# Patient Record
Sex: Female | Born: 1940 | Race: Black or African American | Hispanic: No | Marital: Married | Smoking: Never smoker
Health system: Southern US, Community
[De-identification: ages and names within clinical notes are randomized; demographics above are authoritative.]

## PROBLEM LIST (undated history)

## (undated) DIAGNOSIS — I10 Essential (primary) hypertension: Secondary | ICD-10-CM

## (undated) DIAGNOSIS — IMO0002 Reserved for concepts with insufficient information to code with codable children: Secondary | ICD-10-CM

## (undated) DIAGNOSIS — J45909 Unspecified asthma, uncomplicated: Secondary | ICD-10-CM

## (undated) DIAGNOSIS — M329 Systemic lupus erythematosus, unspecified: Secondary | ICD-10-CM

## (undated) HISTORY — PX: SHOULDER ARTHROSCOPY W/ ROTATOR CUFF REPAIR: SHX2400

## (undated) HISTORY — PX: ABDOMINAL HYSTERECTOMY: SHX81

## (undated) HISTORY — DX: Unspecified asthma, uncomplicated: J45.909

## (undated) HISTORY — PX: CHOLECYSTECTOMY: SHX55

## (undated) HISTORY — PX: TONSILLECTOMY: SUR1361

---

## 1998-10-08 ENCOUNTER — Encounter: Payer: Self-pay | Admitting: Emergency Medicine

## 1998-10-08 ENCOUNTER — Ambulatory Visit (HOSPITAL_COMMUNITY): Admission: RE | Admit: 1998-10-08 | Discharge: 1998-10-08 | Payer: Self-pay | Admitting: Emergency Medicine

## 1999-10-21 ENCOUNTER — Encounter: Payer: Self-pay | Admitting: Emergency Medicine

## 1999-10-21 ENCOUNTER — Ambulatory Visit (HOSPITAL_COMMUNITY): Admission: RE | Admit: 1999-10-21 | Discharge: 1999-10-21 | Payer: Self-pay | Admitting: Emergency Medicine

## 1999-12-15 ENCOUNTER — Encounter: Admission: RE | Admit: 1999-12-15 | Discharge: 1999-12-15 | Payer: Self-pay | Admitting: Emergency Medicine

## 1999-12-15 ENCOUNTER — Encounter: Payer: Self-pay | Admitting: Emergency Medicine

## 2000-09-23 ENCOUNTER — Ambulatory Visit (HOSPITAL_COMMUNITY): Admission: RE | Admit: 2000-09-23 | Discharge: 2000-09-23 | Payer: Self-pay | Admitting: Emergency Medicine

## 2000-09-23 ENCOUNTER — Encounter: Payer: Self-pay | Admitting: Emergency Medicine

## 2001-09-25 ENCOUNTER — Encounter: Payer: Self-pay | Admitting: Emergency Medicine

## 2001-09-25 ENCOUNTER — Ambulatory Visit (HOSPITAL_COMMUNITY): Admission: RE | Admit: 2001-09-25 | Discharge: 2001-09-25 | Payer: Self-pay | Admitting: Emergency Medicine

## 2002-01-16 ENCOUNTER — Other Ambulatory Visit: Admission: RE | Admit: 2002-01-16 | Discharge: 2002-01-16 | Payer: Self-pay | Admitting: Internal Medicine

## 2002-03-12 ENCOUNTER — Ambulatory Visit (HOSPITAL_COMMUNITY): Admission: RE | Admit: 2002-03-12 | Discharge: 2002-03-12 | Payer: Self-pay | Admitting: Gastroenterology

## 2002-09-26 ENCOUNTER — Encounter: Payer: Self-pay | Admitting: Internal Medicine

## 2002-09-26 ENCOUNTER — Ambulatory Visit (HOSPITAL_COMMUNITY): Admission: RE | Admit: 2002-09-26 | Discharge: 2002-09-26 | Payer: Self-pay | Admitting: Internal Medicine

## 2003-09-10 ENCOUNTER — Ambulatory Visit (HOSPITAL_BASED_OUTPATIENT_CLINIC_OR_DEPARTMENT_OTHER): Admission: RE | Admit: 2003-09-10 | Discharge: 2003-09-10 | Payer: Self-pay | Admitting: Orthopedic Surgery

## 2003-09-10 ENCOUNTER — Ambulatory Visit (HOSPITAL_COMMUNITY): Admission: RE | Admit: 2003-09-10 | Discharge: 2003-09-10 | Payer: Self-pay | Admitting: Orthopedic Surgery

## 2003-09-10 ENCOUNTER — Encounter (INDEPENDENT_AMBULATORY_CARE_PROVIDER_SITE_OTHER): Payer: Self-pay | Admitting: Orthopedic Surgery

## 2003-09-30 ENCOUNTER — Ambulatory Visit (HOSPITAL_COMMUNITY): Admission: RE | Admit: 2003-09-30 | Discharge: 2003-09-30 | Payer: Self-pay | Admitting: Internal Medicine

## 2004-04-05 ENCOUNTER — Encounter: Admission: RE | Admit: 2004-04-05 | Discharge: 2004-04-05 | Payer: Self-pay | Admitting: Rheumatology

## 2004-09-30 ENCOUNTER — Ambulatory Visit (HOSPITAL_COMMUNITY): Admission: RE | Admit: 2004-09-30 | Discharge: 2004-09-30 | Payer: Self-pay | Admitting: Internal Medicine

## 2005-10-07 ENCOUNTER — Ambulatory Visit (HOSPITAL_COMMUNITY): Admission: RE | Admit: 2005-10-07 | Discharge: 2005-10-07 | Payer: Self-pay | Admitting: Internal Medicine

## 2006-10-11 ENCOUNTER — Ambulatory Visit (HOSPITAL_COMMUNITY): Admission: RE | Admit: 2006-10-11 | Discharge: 2006-10-11 | Payer: Self-pay | Admitting: Internal Medicine

## 2007-10-17 ENCOUNTER — Ambulatory Visit (HOSPITAL_COMMUNITY): Admission: RE | Admit: 2007-10-17 | Discharge: 2007-10-17 | Payer: Self-pay | Admitting: Internal Medicine

## 2008-11-12 ENCOUNTER — Ambulatory Visit (HOSPITAL_COMMUNITY): Admission: RE | Admit: 2008-11-12 | Discharge: 2008-11-12 | Payer: Self-pay | Admitting: Internal Medicine

## 2009-11-13 ENCOUNTER — Ambulatory Visit (HOSPITAL_COMMUNITY): Admission: RE | Admit: 2009-11-13 | Discharge: 2009-11-13 | Payer: Self-pay | Admitting: Internal Medicine

## 2010-07-04 ENCOUNTER — Emergency Department (HOSPITAL_COMMUNITY): Admission: EM | Admit: 2010-07-04 | Discharge: 2010-07-04 | Payer: Self-pay | Admitting: Family Medicine

## 2010-09-20 ENCOUNTER — Encounter: Payer: Self-pay | Admitting: Internal Medicine

## 2010-10-16 ENCOUNTER — Other Ambulatory Visit (HOSPITAL_COMMUNITY): Payer: Self-pay | Admitting: Internal Medicine

## 2010-10-16 DIAGNOSIS — Z1231 Encounter for screening mammogram for malignant neoplasm of breast: Secondary | ICD-10-CM

## 2010-11-16 ENCOUNTER — Ambulatory Visit (HOSPITAL_COMMUNITY)
Admission: RE | Admit: 2010-11-16 | Discharge: 2010-11-16 | Disposition: A | Discharge status: Home / Self Care | Payer: Medicare Other | Source: Ambulatory Visit | Attending: Internal Medicine | Admitting: Internal Medicine

## 2010-11-16 DIAGNOSIS — Z1231 Encounter for screening mammogram for malignant neoplasm of breast: Secondary | ICD-10-CM

## 2011-01-15 NOTE — Procedures (Signed)
. [facility]  Patient:    Jessica Booker, Jessica Booker Visit Number: [id number] MRN: [id number]          Service Type: END Location: ENDO Attending Physician:  Charna Elizabeth Dictated by:   Anselmo Rod, M.D. Proc. Date: 03/12/02 Admit Date:  03/12/2002 Discharge Date: 03/12/2002   CC:         Velna Hatchet, M.D.   Procedure Report  DATE OF BIRTH:  November 08, 1940.  PROCEDURE:  Esophagogastroduodenoscopy.  ENDOSCOPIST:  Anselmo Rod, M.D.  INSTRUMENT USED:  Olympus video panendoscope.  INDICATION FOR PROCEDURE:  A 70 year old African-American female with a history of guaiac-positive stool and anemia.  Rule out peptic ulcer disease, esophagitis, gastritis, etc.  PREPROCEDURE PREPARATION:  Informed consent was procured from the patient. The patient was fasted for eight hours prior to the procedure.  PREPROCEDURE PHYSICAL:  VITAL SIGNS:  The patient had stable vital signs.  NECK:  Supple.  CHEST:  Clear to auscultation.  S1, S2 regular.  ABDOMEN:  Soft with normal bowel sounds.  DESCRIPTION OF PROCEDURE:  The patient was placed in the left lateral decubitus position and sedated with 60 mg of Demerol and 6 mg of Versed intravenously.  Once the patient was adequately sedate and maintained on low-flow oxygen and continuous cardiac monitoring, the Olympus video panendoscope was advanced through the mouthpiece, over the tongue, into the esophagus under direct vision.  The entire esophagus, including the stomach and the proximal small bowel, appeared normal.  No erosions, ulcerations, masses, or polyps were present.  IMPRESSION:  Normal EGD.  RECOMMENDATIONS:  Proceed with a colonoscopy at this time. Dictated by:   Anselmo Rod, M.D. Attending Physician:  Charna Elizabeth DD:  03/12/02 TD:  03/15/02 Job: [id number]

## 2011-01-15 NOTE — Op Note (Signed)
NAME:  Jessica Booker, Jessica Booker                       ACCOUNT NO.:  [id number]   MEDICAL RECORD NO.:  [id number]                   PATIENT TYPE:  AMB   LOCATION:  DSC                                  FACILITY:  [facility]   PHYSICIAN:  Katy Fitch. Naaman Plummer., M.D.          DATE OF BIRTH:  [birth year]   DATE OF PROCEDURE:  09/10/2003  DATE OF DISCHARGE:                                 OPERATIVE REPORT   PREOPERATIVE DIAGNOSIS:  1. 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  2. Skin lesion consistent with wart, left palm.  3. Carpal metacarpal degenerative arthritis left thumb.  4. Chronic stenosing tenosynovitis left long finger.   POSTOPERATIVE DIAGNOSIS:  1. 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  2. Skin lesion consistent with wart, left palm.  3. Carpal metacarpal degenerative arthritis left thumb.  4. Chronic stenosing tenosynovitis left long finger.   PROCEDURE:  1. Release of left thumb A1 pulley.  2. Excision of wart, left palm.  3. Injection of left thumb CMC joint with Depo-Medrol 40 mg/mL.  4. Injection of left long finger stenosing tenosynovitis at A1 pulley.   SURGEON:  Katy Fitch. Sypher, M.D.   ASSISTANT:  Jonni Sanger, P.A.   ANESTHESIA:  General by LMA, supervising anesthesiologist is Maren Beach, M.D.   INDICATIONS FOR PROCEDURE:  The patient is a well-known patient who has been  followed for many years for degenerative arthritis of her left thumb CMC  joint and episodes of stenosing tenosynovitis.  She has failed to respond to  injection, activity modification, and anti-inflammatory treatment of her  left thumb chronic stenosing tenosynovitis.  We recommended proceeding with  release of her left thumb A1 pulley.   She noted other incidental problems including early stenosing tenosynovitis  of her long finger and a wart-like lesion growing in the palm adjacent to  the thumb/index web space of the left hand.  She also had chronic  pain in  the base of her left thumb and requested injection while under anesthesia.   After informed consent, she is brought for this cluster of procedures.   DESCRIPTION OF PROCEDURE:  The patient was brought to the operating room and  placed in the supine position on the operating table.  Following induction  of general anesthesia, the right arm was prepped with Betadine soap and  solution and sterilely draped.  Following exsanguination of the limb in the  Esmarch bandage, the arterial tourniquet on the proximal brachium was  inflated to 220 mmHg.   The procedure commenced with a short transverse incision directly over the  palpably thickened A1 pulley of the thumb.  Subcutaneous tissues were  carefully divided from the palmar fascia.  This was split with scissors  revealing the A1 pulley of the flexor sheath.  The radial proper digital  nerve was gently retracted.  The A1 pulley was split with scalpel and  scissors and the tendon  delivered.  There was no sign of tendon damage.  Thereafter free range of motion of the IP joint was recovered.  The wound  was then repaired with mattress suture of 5-0 nylon.   Attention was then directed to the palm.  A 4 mm x 5 mm mass was excised  with an elliptical incision with a margin of normal tissue.  This was  consistent with a chronic verrucous wart-like lesion.  This was passed off  for pathologic evaluation.   The thumb CMC joint was then injected with approximately 0.75 mL of Depo-  Medrol 40 mg/mL and the long finger flexor sheath was injected with standard  technique utilizing a mixture of 1% plain lidocaine and Depo-Medrol 40 mg/mL  a total of about 1.2 mL distending the flexor sheath.   The patient had also asked if we could help remedy a wart-like lesion  adjacent to her index fingernail proximal to the ulnar nail fold.  I  explained to her that electrosurgery and incisional surgery was not a good  idea so close to her nail matrix,  however, we will attempt to curet this.   The wart-like lesion was curetted to a normal dermal base.  Hopefully, this  will correct this predicament as well for her.   All wounds were then dressed with Xeroform, sterile gauze, and Coban.  There  were no apparent complications.   The patient was awakened from anesthesia and transferred to the recovery  room with stable vital signs.  She will be discharged with prescription for  Darvocet-N 100 one to two tablets p.o. q.4-6h p.r.n. pain 20 tablets without  refill.                                               Katy Fitch Naaman Plummer., M.D.    RVS/MEDQ  D:  09/10/2003  T:  09/10/2003  Job:  [id number]

## 2011-01-15 NOTE — Procedures (Signed)
[place]  Patient:    Jessica Booker, Jessica Booker Visit Number: [id number] MRN: [id number]          Service Type: END Location: ENDO Attending Physician:  Charna Elizabeth Dictated by:   Anselmo Rod, M.D. Proc. Date: 03/12/02 Admit Date:  03/12/2002 Discharge Date: 03/12/2002   CC:         Velna Hatchet, M.D.   Procedure Report  DATE OF BIRTH:  1941-04-25.  PROCEDURE:  Screening colonoscopy.  ENDOSCOPIST:  Anselmo Rod, M.D.  INSTRUMENT USED:  Olympus video colonoscope.  INDICATION FOR PROCEDURE:  Guaiac-positive stools and a history of iron deficiency anemia in a 70 year old African-American female.  Rule out colonic polyps, masses, hemorrhoids, etc.  PREPROCEDURE PREPARATION:  Informed consent was procured from the patient. The patient was fasted for eight hours prior to the procedure and prepped with a bottle of magnesium citrate and a gallon of NuLytely the night prior to the procedure.  PREPROCEDURE PHYSICAL:  VITAL SIGNS:  The patient had stable vital signs.  NECK:  Supple.  CHEST:  Clear to auscultation.  S1, S2 regular.  ABDOMEN:  Soft with normal bowel sounds.  DESCRIPTION OF PROCEDURE:  The patient was placed in the left lateral decubitus position and sedated with additional 20 mg of Demerol and 2 mg of Versed intravenously.  Once the patient was adequately sedate and maintained on low-flow oxygen and continuous cardiac monitoring, the Olympus video colonoscope was advanced from the rectum to the cecum without difficulty.  No masses, polyps, erosions, ulcerations, diverticula, or hemorrhoids were seen. The procedure was complete up to the cecum.  The appendiceal orifice and the ileocecal valve were clearly visualized and photographed.  IMPRESSION:  Normal colonoscopy.  RECOMMENDATIONS: 1. A high-fiber diet has been recommended for the patient. 2. Repeat guaiac testing will be done on an outpatient basis and  further    recommendations made as needed. 3. Serial CBCs will be done to monitor her hemoglobin closely. Dictated by:   Anselmo Rod, M.D. Attending Physician:  Charna Elizabeth DD:  03/12/02 TD:  03/15/02 Job: [id number]

## 2011-01-31 ENCOUNTER — Emergency Department (HOSPITAL_COMMUNITY): Payer: Medicare Other

## 2011-01-31 ENCOUNTER — Emergency Department (HOSPITAL_COMMUNITY)
Admission: EM | Admit: 2011-01-31 | Discharge: 2011-01-31 | Disposition: A | Discharge status: Home / Self Care | Payer: Medicare Other | Attending: Emergency Medicine | Admitting: Emergency Medicine

## 2011-01-31 DIAGNOSIS — Z79899 Other long term (current) drug therapy: Secondary | ICD-10-CM | POA: Insufficient documentation

## 2011-01-31 DIAGNOSIS — I10 Essential (primary) hypertension: Secondary | ICD-10-CM | POA: Insufficient documentation

## 2011-01-31 DIAGNOSIS — IMO0002 Reserved for concepts with insufficient information to code with codable children: Secondary | ICD-10-CM | POA: Insufficient documentation

## 2011-01-31 DIAGNOSIS — M329 Systemic lupus erythematosus, unspecified: Secondary | ICD-10-CM | POA: Insufficient documentation

## 2011-01-31 DIAGNOSIS — R109 Unspecified abdominal pain: Secondary | ICD-10-CM | POA: Insufficient documentation

## 2011-01-31 LAB — CBC
MCH: 26.9 pg (ref 26.0–34.0)
MCHC: 33.5 g/dL (ref 30.0–36.0)
MCV: 80.1 fL (ref 78.0–100.0)
RBC: 4.28 MIL/uL (ref 3.87–5.11)
RDW: 14.1 % (ref 11.5–15.5)

## 2011-01-31 LAB — COMPREHENSIVE METABOLIC PANEL
Alkaline Phosphatase: 93 U/L (ref 39–117)
CO2: 31 mEq/L (ref 19–32)
Chloride: 101 mEq/L (ref 96–112)
GFR calc Af Amer: >60 mL/min (ref >60)
GFR calc non Af Amer: >60 mL/min (ref >60)
Glucose, Bld: 85 mg/dL (ref 70–99)
Sodium: 140 mEq/L (ref 135–145)
Total Bilirubin: 0.4 mg/dL (ref 0.3–1.2)
Total Protein: 7.9 g/dL (ref 6.0–8.3)

## 2011-01-31 LAB — URINALYSIS, ROUTINE W REFLEX MICROSCOPIC
Glucose, UA: NEGATIVE mg/dL
Hgb urine dipstick: NEGATIVE
Ketones, ur: NEGATIVE mg/dL
Nitrite: NEGATIVE

## 2011-01-31 LAB — DIFFERENTIAL
Eosinophils Absolute: 0.1 10*3/uL (ref 0.0–0.7)
Eosinophils Relative: 1 % (ref 0–5)
Lymphocytes Relative: 27 % (ref 12–46)
Lymphs Abs: 2.6 10*3/uL (ref 0.7–4.0)
Monocytes Relative: 7 % (ref 3–12)
Neutro Abs: 6.2 10*3/uL (ref 1.7–7.7)

## 2011-11-04 ENCOUNTER — Other Ambulatory Visit (HOSPITAL_COMMUNITY): Payer: Self-pay | Admitting: Internal Medicine

## 2011-11-04 DIAGNOSIS — Z1231 Encounter for screening mammogram for malignant neoplasm of breast: Secondary | ICD-10-CM

## 2011-11-15 ENCOUNTER — Other Ambulatory Visit: Payer: Self-pay | Admitting: Cardiology

## 2011-11-15 DIAGNOSIS — I739 Peripheral vascular disease, unspecified: Secondary | ICD-10-CM

## 2011-11-16 ENCOUNTER — Encounter (INDEPENDENT_AMBULATORY_CARE_PROVIDER_SITE_OTHER): Payer: Medicare Other

## 2011-11-16 DIAGNOSIS — I739 Peripheral vascular disease, unspecified: Secondary | ICD-10-CM

## 2011-11-30 ENCOUNTER — Ambulatory Visit (HOSPITAL_COMMUNITY)
Admission: RE | Admit: 2011-11-30 | Discharge: 2011-11-30 | Disposition: A | Discharge status: Home / Self Care | Payer: Medicare Other | Source: Ambulatory Visit | Attending: Internal Medicine | Admitting: Internal Medicine

## 2011-11-30 DIAGNOSIS — Z1231 Encounter for screening mammogram for malignant neoplasm of breast: Secondary | ICD-10-CM

## 2012-08-14 ENCOUNTER — Encounter (HOSPITAL_COMMUNITY): Payer: Self-pay | Admitting: *Deleted

## 2012-08-14 ENCOUNTER — Emergency Department (INDEPENDENT_AMBULATORY_CARE_PROVIDER_SITE_OTHER)
Admission: EM | Admit: 2012-08-14 | Discharge: 2012-08-14 | Disposition: A | Discharge status: Home / Self Care | Payer: Medicare Other | Source: Home / Self Care | Attending: Emergency Medicine | Admitting: Emergency Medicine

## 2012-08-14 ENCOUNTER — Emergency Department (INDEPENDENT_AMBULATORY_CARE_PROVIDER_SITE_OTHER): Payer: Medicare Other

## 2012-08-14 DIAGNOSIS — J45909 Unspecified asthma, uncomplicated: Secondary | ICD-10-CM

## 2012-08-14 HISTORY — DX: Essential (primary) hypertension: I10

## 2012-08-14 HISTORY — DX: Systemic lupus erythematosus, unspecified: M32.9

## 2012-08-14 HISTORY — DX: Reserved for concepts with insufficient information to code with codable children: IMO0002

## 2012-08-14 MED ORDER — AZITHROMYCIN 250 MG PO TABS: ORAL_TABLET | ORAL | Status: DC

## 2012-08-14 MED ORDER — ALBUTEROL SULFATE (5 MG/ML) 0.5% IN NEBU
5.0000 mg | INHALATION_SOLUTION | Freq: Once | RESPIRATORY_TRACT | Status: AC
  Administered 2012-08-14: 5 mg via RESPIRATORY_TRACT

## 2012-08-14 MED ORDER — IPRATROPIUM BROMIDE 0.02 % IN SOLN
0.5000 mg | Freq: Once | RESPIRATORY_TRACT | Status: AC
  Administered 2012-08-14: 0.5 mg via RESPIRATORY_TRACT

## 2012-08-14 MED ORDER — ALBUTEROL SULFATE (5 MG/ML) 0.5% IN NEBU
INHALATION_SOLUTION | RESPIRATORY_TRACT | Status: AC
  Filled 2012-08-14: qty 1

## 2012-08-14 MED ORDER — HYDROCOD POLST-CHLORPHEN POLST 10-8 MG/5ML PO LQCR: 5.0000 mL | Freq: Two times a day (BID) | ORAL | Status: DC | PRN

## 2012-08-14 MED ORDER — ALBUTEROL SULFATE HFA 108 (90 BASE) MCG/ACT IN AERS: 1.0000 | INHALATION_SPRAY | Freq: Four times a day (QID) | RESPIRATORY_TRACT | Status: DC | PRN

## 2012-08-14 MED ORDER — PREDNISONE 5 MG PO KIT: 1.0000 | PACK | Freq: Every day | ORAL | Status: DC

## 2012-08-14 NOTE — ED Provider Notes (Signed)
Chief Complaint  Patient presents with  . Cough    History of Present Illness:   Jessica Booker is a 71 year old female who has had a six-day history of cough productive of white to yellow sputum, chest tightness, wheezing, chest pain, ear congestion, nasal congestion with yellow drainage, headache, scratchy throat, chills, and diarrhea. She has hypertension and lupus and is on a number of medications for that. She denies any fever, nausea, or vomiting. There is no history of asthma.  Review of Systems:  Other than noted above, the patient denies any of the following symptoms. Systemic:  No fever, chills, sweats, fatigue, myalgias, headache, or anorexia. Eye:  No redness, pain or drainage. ENT:  No earache, ear congestion, nasal congestion, sneezing, rhinorrhea, sinus pressure, sinus pain, post nasal drip, or sore throat. Lungs:  No cough, sputum production, wheezing, shortness of breath, or chest pain. GI:  No abdominal pain, nausea, vomiting, or diarrhea.  PMFSH:  Past medical history, family history, social history, meds, and allergies were reviewed.  Physical Exam:   Vital signs:  BP 158/78  Pulse 83  Temp 98.1 F (36.7 C) (Oral)  Resp 20  SpO2 98% General:  Alert, in no distress. Eye:  No conjunctival injection or drainage. Lids were normal. ENT:  TMs and canals were normal, without erythema or inflammation.  Nasal mucosa was clear and uncongested, without drainage.  Mucous membranes were moist.  Pharynx was clear, without exudate or drainage.  There were no oral ulcerations or lesions. Neck:  Supple, no adenopathy, tenderness or mass. Lungs:  No respiratory distress.  She has loud inspiratory and expiratory wheezes bilaterally.  Heart:  Regular rhythm, without gallops, murmers or rubs. Skin:  Clear, warm, and dry, without rash or lesions.  Radiology:  Dg Chest 2 View  08/14/2012  *RADIOLOGY REPORT*  Clinical Data: Cough, congestion and low grade fever.  History of bronchitis.  CHEST -  2 VIEW  Comparison: None.  Findings: The heart size and mediastinal contours are normal. There is aortic atherosclerosis.  The lungs are clear and do not appear significantly hyperinflated.  There is no pleural effusion. Cholecystectomy clips are noted.  IMPRESSION: No active cardiopulmonary process.   Original Report Authenticated By: Carey Bullocks, M.D.    I reviewed the images independently and personally and concur with the radiologist's findings.  Course in Urgent Care Center:   She was given a DuoNeb breathing treatment but this made her cough worse. We therefore stopped it and switched to just a plain albuterol breathing treatment and this did seem to help with her cough and wheezing. After the breathing treatment she stated she felt better although her lungs still showed bilateral expiratory or wheezes. I suggested going to the hospital but she preferred to go home.  Assessment:  The encounter diagnosis was Asthmatic bronchitis.  Plan:   1.  The following meds were prescribed:   New Prescriptions   ALBUTEROL (PROVENTIL HFA;VENTOLIN HFA) 108 (90 BASE) MCG/ACT INHALER    Inhale 1-2 puffs into the lungs every 6 (six) hours as needed for wheezing.   AZITHROMYCIN (ZITHROMAX Z-PAK) 250 MG TABLET    Take as directed.   CHLORPHENIRAMINE-HYDROCODONE (TUSSIONEX) 10-8 MG/5ML LQCR    Take 5 mLs by mouth every 12 (twelve) hours as needed.   PREDNISONE 5 MG KIT    Take 1 kit (5 mg total) by mouth daily after breakfast. Prednisone 5 mg 6 day dosepack.  Take as directed.   2.  The patient was instructed in  symptomatic care and handouts were given. 3.  The patient was told to return if becoming worse in any way, if no better in 3 or 4 days, and given some red flag symptoms that would indicate earlier return.   Reuben Likes, MD 08/14/12 [phone]

## 2012-08-14 NOTE — ED Notes (Signed)
Dr. Lorenz Coaster notified.  He said to change tx. to just Albuteral.  He checked pt. and she had stopped coughing and was talking on the phone.  Treatment restarted with just Albuteral and pt. tolerating better.

## 2012-08-14 NOTE — ED Notes (Signed)
C/o cough onset last Wednesday with congestion in her throat.  Sharp pain in her chest when she coughs like a knife.  Also has sneezing, both ears itching, and warm at night and sweat on her pillow but did not check her temperature.

## 2012-10-24 ENCOUNTER — Other Ambulatory Visit (HOSPITAL_COMMUNITY): Payer: Self-pay | Admitting: Internal Medicine

## 2012-10-24 DIAGNOSIS — Z1231 Encounter for screening mammogram for malignant neoplasm of breast: Secondary | ICD-10-CM

## 2012-12-01 ENCOUNTER — Ambulatory Visit (HOSPITAL_COMMUNITY)
Admission: RE | Admit: 2012-12-01 | Discharge: 2012-12-01 | Disposition: A | Discharge status: Home / Self Care | Payer: Medicare Other | Source: Ambulatory Visit | Attending: Internal Medicine | Admitting: Internal Medicine

## 2012-12-01 DIAGNOSIS — Z1231 Encounter for screening mammogram for malignant neoplasm of breast: Secondary | ICD-10-CM | POA: Insufficient documentation

## 2013-10-24 ENCOUNTER — Other Ambulatory Visit (HOSPITAL_COMMUNITY): Payer: Self-pay | Admitting: Internal Medicine

## 2013-10-24 DIAGNOSIS — Z1231 Encounter for screening mammogram for malignant neoplasm of breast: Secondary | ICD-10-CM

## 2013-12-04 ENCOUNTER — Ambulatory Visit (HOSPITAL_COMMUNITY)
Admission: RE | Admit: 2013-12-04 | Discharge: 2013-12-04 | Disposition: A | Discharge status: Home / Self Care | Payer: Medicare Other | Source: Ambulatory Visit | Attending: Internal Medicine | Admitting: Internal Medicine

## 2013-12-04 DIAGNOSIS — Z1231 Encounter for screening mammogram for malignant neoplasm of breast: Secondary | ICD-10-CM

## 2014-12-03 ENCOUNTER — Other Ambulatory Visit (HOSPITAL_COMMUNITY): Payer: Self-pay | Admitting: Internal Medicine

## 2014-12-03 DIAGNOSIS — Z1231 Encounter for screening mammogram for malignant neoplasm of breast: Secondary | ICD-10-CM

## 2014-12-13 ENCOUNTER — Ambulatory Visit (HOSPITAL_COMMUNITY)
Admission: RE | Admit: 2014-12-13 | Discharge: 2014-12-13 | Disposition: A | Discharge status: Home / Self Care | Payer: Medicare Other | Source: Ambulatory Visit | Attending: Internal Medicine | Admitting: Internal Medicine

## 2014-12-13 DIAGNOSIS — Z1231 Encounter for screening mammogram for malignant neoplasm of breast: Secondary | ICD-10-CM | POA: Diagnosis present

## 2015-05-30 ENCOUNTER — Other Ambulatory Visit: Payer: Self-pay | Admitting: *Deleted

## 2015-05-30 DIAGNOSIS — I83893 Varicose veins of bilateral lower extremities with other complications: Secondary | ICD-10-CM

## 2015-07-30 ENCOUNTER — Encounter: Payer: Self-pay | Admitting: Surgery

## 2015-08-04 ENCOUNTER — Ambulatory Visit (HOSPITAL_COMMUNITY)
Admission: RE | Admit: 2015-08-04 | Discharge: 2015-08-04 | Disposition: A | Discharge status: Home / Self Care | Payer: Medicare Other | Source: Ambulatory Visit | Attending: Surgery | Admitting: Surgery

## 2015-08-04 ENCOUNTER — Ambulatory Visit (INDEPENDENT_AMBULATORY_CARE_PROVIDER_SITE_OTHER): Payer: Medicare Other | Admitting: Surgery

## 2015-08-04 ENCOUNTER — Encounter: Payer: Self-pay | Admitting: Surgery

## 2015-08-04 VITALS — BP 136/60 | HR 56 | Temp 98.1°F | Resp 16 | Ht 64.0 in | Wt 170.0 lb

## 2015-08-04 DIAGNOSIS — I872 Venous insufficiency (chronic) (peripheral): Secondary | ICD-10-CM | POA: Diagnosis not present

## 2015-08-04 DIAGNOSIS — I10 Essential (primary) hypertension: Secondary | ICD-10-CM | POA: Diagnosis not present

## 2015-08-04 DIAGNOSIS — I83893 Varicose veins of bilateral lower extremities with other complications: Secondary | ICD-10-CM | POA: Insufficient documentation

## 2015-08-04 NOTE — Progress Notes (Signed)
Patient name: Jessica Booker MRN: [id number] DOB: 28-Mar-1941 Sex: female   Referred by: Minette Brine  Reason for referral:  Chief Complaint  Patient presents with  . Varicose Veins    BLE pain x 3-4- months, R>L  No injuries, procedures or surgeries on either leg.      HISTORY OF PRESENT ILLNESS:  this is a very pleasant 74 year old female who comes in today 4 bilateral leg pain, right greater than left.  This began approximately 2-3 months ago. It has been getting better.  It has been associated with swelling.  She also complains of some numbness in the tips of her toes.   The patient has a history of lupus , which she states has been under fairly good control. She is also medically treated for hypertension.  Past Medical History  Diagnosis Date  . Hypertension   . Lupus [facility])     Past Surgical History  Procedure Laterality Date  . Tonsillectomy    . Cholecystectomy    . Abdominal hysterectomy    . Shoulder arthroscopy w/ rotator cuff repair      R shoulder    Social History   Social History  . Marital Status: Married    Spouse Name: N/A  . Number of Children: N/A  . Years of Education: N/A   Occupational History  . Not on file.   Social History Main Topics  . Smoking status: Never Smoker   . Smokeless tobacco: Not on file  . Alcohol Use: Not on file  . Drug Use: Not on file  . Sexual Activity: Yes    Birth Control/ Protection: Surgical   Other Topics Concern  . Not on file   Social History Narrative    Family History  Problem Relation Age of Onset  . Heart attack Mother   . Stomach cancer Father   . Hypertension Sister   . Heart disease Sister     Allergies as of 08/04/2015  . (No Known Allergies)    Current Outpatient Prescriptions on File Prior to Visit  Medication Sig Dispense Refill  . aspirin 81 MG tablet Take 81 mg by mouth daily.    . hydroxychloroquine (PLAQUENIL) 200 MG tablet Take 200 mg by mouth at bedtime.    Marland Kitchen NIFEdipine  (PROCARDIA-XL/ADALAT CC) 30 MG 24 hr tablet Take 30 mg by mouth daily.    Marland Kitchen albuterol (PROVENTIL HFA;VENTOLIN HFA) 108 (90 BASE) MCG/ACT inhaler Inhale 1-2 puffs into the lungs every 6 (six) hours as needed for wheezing. (Patient not taking: Reported on 08/04/2015) 1 Inhaler 0  . fish oil-omega-3 fatty acids 1000 MG capsule Take 1 g by mouth 2 (two) times daily.    . PredniSONE 5 MG KIT Take 1 kit (5 mg total) by mouth daily after breakfast. Prednisone 5 mg 6 day dosepack.  Take as directed. (Patient not taking: Reported on 08/04/2015) 1 kit 0   No current facility-administered medications on file prior to visit.     REVIEW OF SYSTEMS: Cardiovascular: No chest pain, chest pressure, palpitations, orthopnea, or dyspnea on exertion. Positive for pain in legs on walking No history of DVT or phlebitis. Pulmonary: No productive cough, asthma or wheezing. Neurologic: No weakness, paresthesias, aphasia, or amaurosis. No dizziness. Hematologic: No bleeding problems or clotting disorders. Musculoskeletal: No joint pain or joint swelling. Gastrointestinal: No blood in stool or hematemesis Genitourinary: No dysuria or hematuria. Psychiatric:: No history of major depression. Integumentary: No rashes or ulcers. Constitutional: No fever or  chills.  PHYSICAL EXAMINATION:  Filed Vitals:   08/04/15 1317  BP: 136/60  Pulse: 56  Temp: 98.1 F (36.7 C)  TempSrc: Oral  Resp: 16  Height: '5\' 4"'  (1.626 m)  Weight: 170 lb (77.111 kg)  SpO2: 98%   Body mass index is 29.17 kg/(m^2). General: The patient appears their stated age.   HEENT:  No gross abnormalities Pulmonary: Respirations are non-labored Abdomen: Soft and non-tender  Musculoskeletal: There are no major deformities.   Neurologic: No focal weakness or paresthesias are detected, Skin: There are no ulcer or rashes noted. Psychiatric: The patient has normal affect. Cardiovascular: There is a regular rate and rhythm without significant murmur  appreciated. 1+ pitting edema bilaterally.  Palpable left dorsalis pedis pulse.  Nonpalpable right however she has a triphasic posterior tibial Doppler signal on the right  Diagnostic Studies:  venous reflux evaluation was performed today.  There is no evidence of deep vein reflux or thrombosis  In the right or left leg  No evidence of superficial venous reflux in the left leg.  Trace reflux within the proximal right great saphenous vein    Assessment:   bilateral leg pain, right greater than left Plan:  I feel the patient has excellent arterial blood flow to both legs and that this is not arterial in origin. In addition, reflux study today shows no significant venous pathology. Therefore I suspect that her leg discomfort is possibly related to her lupus or an underlying lymphedema.  I have recommended that she attempts to wear compression stockings to help with her edema to see if this alleviates any of her symptoms.  She will contact me if she has any further questions.     Eldridge Abrahams, M.D. [facility] Office: [phone] Pager:  [phone]

## 2015-08-05 ENCOUNTER — Encounter: Payer: Self-pay | Admitting: Internal Medicine

## 2015-11-27 ENCOUNTER — Other Ambulatory Visit: Payer: Self-pay

## 2015-11-27 DIAGNOSIS — Z1231 Encounter for screening mammogram for malignant neoplasm of breast: Secondary | ICD-10-CM

## 2015-12-19 ENCOUNTER — Ambulatory Visit
Admission: RE | Admit: 2015-12-19 | Discharge: 2015-12-19 | Disposition: A | Discharge status: Home / Self Care | Payer: Medicare Other | Source: Ambulatory Visit

## 2015-12-19 DIAGNOSIS — Z1231 Encounter for screening mammogram for malignant neoplasm of breast: Secondary | ICD-10-CM

## 2016-02-18 ENCOUNTER — Encounter (HOSPITAL_COMMUNITY): Payer: Self-pay | Admitting: *Deleted

## 2016-02-18 ENCOUNTER — Ambulatory Visit (HOSPITAL_COMMUNITY)
Admission: EM | Admit: 2016-02-18 | Discharge: 2016-02-18 | Disposition: A | Discharge status: Home / Self Care | Payer: Medicare Other | Attending: Emergency Medicine | Admitting: Emergency Medicine

## 2016-02-18 DIAGNOSIS — L03115 Cellulitis of right lower limb: Secondary | ICD-10-CM | POA: Diagnosis not present

## 2016-02-18 MED ORDER — CEPHALEXIN 500 MG PO CAPS: 500.0000 mg | ORAL_CAPSULE | Freq: Four times a day (QID) | ORAL | Status: DC

## 2016-02-18 NOTE — ED Notes (Signed)
Foot  Pain    X  4  Days   -  Pt  Reports a  History  Of hammer toe      And is  Scheduled    To  Have  Surgery  On the  aafected  Toe in      A  Few  Days     [facility]      A  Few  Days ago  And  Noticed  Redness  Swelling  And  Pain in the    r foot     She  denys  Any  specefic  Injury

## 2016-02-18 NOTE — Discharge Instructions (Signed)

## 2016-02-18 NOTE — ED Provider Notes (Signed)
CSN: [id number]     Arrival date & time 02/18/16  1301 History   First MD Initiated Contact with Patient 02/18/16 1346     Chief Complaint  Patient presents with  . Foot Pain   (Consider location/radiation/quality/duration/timing/severity/associated sxs/prior Treatment) HPI History obtained from patient: Location: Right foot  Context/Duration: Patient  states that she was a camp last week, awoke to find her right foot red and swollen. Denies itching or concerns of bites Severity: No pain  Quality: Ache Timing:           Constant Home Treatment: Rubbing alcohol Associated symptoms:  None Family History: Myocardial infarction-mother    Past Medical History  Diagnosis Date  . Hypertension   . Lupus [facility])    Past Surgical History  Procedure Laterality Date  . Tonsillectomy    . Cholecystectomy    . Abdominal hysterectomy    . Shoulder arthroscopy w/ rotator cuff repair      R shoulder   Family History  Problem Relation Age of Onset  . Heart attack Mother   . Stomach cancer Father   . Hypertension Sister   . Heart disease Sister    Social History  Substance Use Topics  . Smoking status: Never Smoker   . Smokeless tobacco: None  . Alcohol Use: None   OB History    No data available     Review of Systems  Denies: HEADACHE, NAUSEA, ABDOMINAL PAIN, CHEST PAIN, CONGESTION, DYSURIA, SHORTNESS OF BREATH  Allergies  Review of patient's allergies indicates no known allergies.  Home Medications   Prior to Admission medications   Medication Sig Start Date End Date Taking? Authorizing Provider  albuterol (PROVENTIL HFA;VENTOLIN HFA) 108 (90 BASE) MCG/ACT inhaler Inhale 1-2 puffs into the lungs every 6 (six) hours as needed for wheezing. Patient not taking: Reported on 08/04/2015 08/14/12   Harden Mo, MD  aspirin 81 MG tablet Take 81 mg by mouth daily.    Historical Provider, MD  cephALEXin (KEFLEX) 500 MG capsule Take 1 capsule (500 mg total) by mouth 4 (four) times  daily. 02/18/16   Konrad Felix, PA  fish oil-omega-3 fatty acids 1000 MG capsule Take 1 g by mouth 2 (two) times daily.    Historical Provider, MD  hydrochlorothiazide (HYDRODIURIL) 25 MG tablet Take 25 mg by mouth daily. 07/29/15   Historical Provider, MD  hydroxychloroquine (PLAQUENIL) 200 MG tablet Take 200 mg by mouth at bedtime.    Historical Provider, MD  NIFEdipine (PROCARDIA-XL/ADALAT CC) 30 MG 24 hr tablet Take 30 mg by mouth daily.    Historical Provider, MD  PredniSONE 5 MG KIT Take 1 kit (5 mg total) by mouth daily after breakfast. Prednisone 5 mg 6 day dosepack.  Take as directed. Patient not taking: Reported on 08/04/2015 08/14/12   Harden Mo, MD   Meds Ordered and Administered this Visit  Medications - No data to display  BP 158/88 mmHg  Pulse 78  Temp(Src) 99.4 F (37.4 C)  Resp 18  SpO2 100% No data found.   Physical Exam NURSES NOTES AND VITAL SIGNS REVIEWED. CONSTITUTIONAL: Well developed, well nourished, no acute distress HEENT: normocephalic, atraumatic EYES: Conjunctiva normal NECK:normal ROM, supple, no adenopathy PULMONARY:No respiratory distress, normal effort ABDOMINAL: Soft, ND, NT BS+, No CVAT MUSCULOSKELETAL: Normal ROM of all extremities, right foot anterior surface is red and warm to touch with pitting edema to the ankle. There are no signs of BITES. The fifth and fourth toes did not look  particularly infected. SKIN: warm and dry without rash PSYCHIATRIC: Mood and affect, behavior are normal  ED Course  Procedures (including critical care time)  Labs Review Labs Reviewed - No data to display  Imaging Review No results found.   Visual Acuity Review  Right Eye Distance:   Left Eye Distance:   Bilateral Distance:    Right Eye Near:   Left Eye Near:    Bilateral Near:      Patient has an appointment with a podiatrist for Monday advised that she should have this followed by a podiatrist. There is a chronic break in the skin between  the fourth and fifth toes, patient is not diabetic and does not have allergies.  Prescription for Keflex 500 mg #20 4 times a day. 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   MDM   1. Cellulitis of right lower extremity     Patient is reassured that there are no issues that require transfer to higher level of care at this time or additional tests. Patient is advised to continue home symptomatic treatment. Patient is advised that if there are new or worsening symptoms to attend the emergency department, contact primary care provider, or return to UC. Instructions of care provided discharged home in stable condition.    THIS NOTE WAS GENERATED USING A VOICE RECOGNITION SOFTWARE PROGRAM. ALL REASONABLE EFFORTS  WERE MADE TO PROOFREAD THIS DOCUMENT FOR ACCURACY.  I have verbally reviewed the discharge instructions with the patient. A printed AVS was given to the patient.  All questions were answered prior to discharge.      Konrad Felix, [place] 02/18/16 1407

## 2016-02-23 ENCOUNTER — Encounter (HOSPITAL_COMMUNITY): Payer: Self-pay | Admitting: Emergency Medicine

## 2016-02-23 ENCOUNTER — Emergency Department (HOSPITAL_COMMUNITY)
Admission: EM | Admit: 2016-02-23 | Discharge: 2016-02-23 | Disposition: A | Discharge status: Home / Self Care | Payer: Medicare Other | Attending: Emergency Medicine | Admitting: Emergency Medicine

## 2016-02-23 ENCOUNTER — Emergency Department (HOSPITAL_COMMUNITY): Payer: Medicare Other

## 2016-02-23 DIAGNOSIS — Z7982 Long term (current) use of aspirin: Secondary | ICD-10-CM | POA: Insufficient documentation

## 2016-02-23 DIAGNOSIS — X58XXXA Exposure to other specified factors, initial encounter: Secondary | ICD-10-CM | POA: Insufficient documentation

## 2016-02-23 DIAGNOSIS — Y999 Unspecified external cause status: Secondary | ICD-10-CM | POA: Diagnosis not present

## 2016-02-23 DIAGNOSIS — Y939 Activity, unspecified: Secondary | ICD-10-CM | POA: Diagnosis not present

## 2016-02-23 DIAGNOSIS — Z79899 Other long term (current) drug therapy: Secondary | ICD-10-CM | POA: Diagnosis not present

## 2016-02-23 DIAGNOSIS — I10 Essential (primary) hypertension: Secondary | ICD-10-CM | POA: Insufficient documentation

## 2016-02-23 DIAGNOSIS — Y929 Unspecified place or not applicable: Secondary | ICD-10-CM | POA: Diagnosis not present

## 2016-02-23 DIAGNOSIS — S91301A Unspecified open wound, right foot, initial encounter: Secondary | ICD-10-CM | POA: Diagnosis not present

## 2016-02-23 DIAGNOSIS — L03115 Cellulitis of right lower limb: Secondary | ICD-10-CM | POA: Diagnosis present

## 2016-02-23 LAB — CBC WITH DIFFERENTIAL/PLATELET
BASOS ABS: 0 10*3/uL (ref 0.0–0.1)
Basophils Relative: 0 %
Eosinophils Absolute: 0.1 10*3/uL (ref 0.0–0.7)
Eosinophils Relative: 1 %
HEMATOCRIT: 38.8 % (ref 36.0–46.0)
HEMOGLOBIN: 12.4 g/dL (ref 12.0–15.0)
LYMPHS ABS: 2.6 10*3/uL (ref 0.7–4.0)
LYMPHS PCT: 28 %
MCH: 25.5 pg — ABNORMAL LOW (ref 26.0–34.0)
MCHC: 32 g/dL (ref 30.0–36.0)
MCV: 79.8 fL (ref 78.0–100.0)
Monocytes Absolute: 0.5 10*3/uL (ref 0.1–1.0)
Monocytes Relative: 5 %
NEUTROS PCT: 66 %
Neutro Abs: 6.1 10*3/uL (ref 1.7–7.7)
Platelets: 331 10*3/uL (ref 150–400)
RBC: 4.86 MIL/uL (ref 3.87–5.11)
RDW: 13.4 % (ref 11.5–15.5)
WBC: 9.3 10*3/uL (ref 4.0–10.5)

## 2016-02-23 LAB — BASIC METABOLIC PANEL
ANION GAP: 7 (ref 5–15)
BUN: 12 mg/dL (ref 6–20)
CHLORIDE: 103 mmol/L (ref 101–111)
CO2: 29 mmol/L (ref 22–32)
Calcium: 9.9 mg/dL (ref 8.9–10.3)
Creatinine, Ser: 0.82 mg/dL (ref 0.44–1.00)
GFR calc non Af Amer: >60 mL/min (ref >60)
GLUCOSE: 93 mg/dL (ref 65–99)
POTASSIUM: 3.5 mmol/L (ref 3.5–5.1)
Sodium: 139 mmol/L (ref 135–145)

## 2016-02-23 MED ORDER — CEPHALEXIN 500 MG PO CAPS: 500.0000 mg | ORAL_CAPSULE | Freq: Four times a day (QID) | ORAL | Status: DC

## 2016-02-23 NOTE — ED Provider Notes (Signed)
Presents with drainage between fourth and fifth toes onset last week. Currently by redness and swelling of her right foot. She reports that she is much improved since treatment with Keflex prescribed by an urgent care doctor. On exam patient is alert and in no distress right lower extremity in a more yellowish drainage between fourth and fifth toes. There is no swelling. There is minimal redness immediately proximal to the web space of the fourth and fifth toes. DP pulse 2+ toes with good capillary refill. Suspect the patient may have had small abscess which is draining spontaneously   Doug SouSam Minahil Quinlivan, MD 02/23/16 1615

## 2016-02-23 NOTE — ED Notes (Signed)
Patient transported to X-ray 

## 2016-02-23 NOTE — ED Notes (Signed)
Pt. Stated, I have this foot infection and I was sent over here by my Podiaitrist

## 2016-02-23 NOTE — ED Provider Notes (Signed)
CSN: [id number]     Arrival date & time 02/23/16  1045 History   First MD Initiated Contact with Patient 02/23/16 1448     Chief Complaint  Patient presents with  . Foot Pain     (Consider location/radiation/quality/duration/timing/severity/associated sxs/prior Treatment) HPI Jessica Booker is a 75 y.o. female history of hypertension and lupus here for evaluation of possible foot infection. Patient reports on 6/21 she was seen at an urgent care facility and diagnosed with a cellulitis in her right foot, started on Keflex, took her last pill yesterday. States that since starting the Keflex, her symptoms have improved. She reports being seen by her podiatrist today for reevaluation and was told that she may have a bone infection and he needed to come to the ED for evaluation. Reports some drainage from the wound in between her fourth and fifth toe. Patient reports intermittent tenderness, warmth in her right foot near her pinky toe. She denies any fevers, chills, nausea or vomiting, abdominal pain, leg pain or cold extremities.  Past Medical History  Diagnosis Date  . Hypertension   . Lupus [facility])    Past Surgical History  Procedure Laterality Date  . Tonsillectomy    . Cholecystectomy    . Abdominal hysterectomy    . Shoulder arthroscopy w/ rotator cuff repair      R shoulder   Family History  Problem Relation Age of Onset  . Heart attack Mother   . Stomach cancer Father   . Hypertension Sister   . Heart disease Sister    Social History  Substance Use Topics  . Smoking status: Never Smoker   . Smokeless tobacco: None  . Alcohol Use: None   OB History    No data available     Review of Systems A 10 point review of systems was completed and was negative except for pertinent positives and negatives as mentioned in the history of present illness     Allergies  Review of patient's allergies indicates no known allergies.  Home Medications   Prior to Admission  medications   Medication Sig Start Date End Date Taking? Authorizing Provider  aspirin 81 MG tablet Take 81 mg by mouth daily.   Yes Historical Provider, MD  hydrochlorothiazide (HYDRODIURIL) 25 MG tablet Take 25 mg by mouth daily. 07/29/15  Yes Historical Provider, MD  hydroxychloroquine (PLAQUENIL) 200 MG tablet Take 200 mg by mouth at bedtime.   Yes Historical Provider, MD  NIFEdipine (PROCARDIA-XL/ADALAT-CC/NIFEDICAL-XL) 30 MG 24 hr tablet Take 30 mg by mouth daily. 12/03/15  Yes Historical Provider, MD  omega-3 acid ethyl esters (LOVAZA) 1 g capsule Take 1 g by mouth 2 (two) times daily.   Yes Historical Provider, MD  albuterol (PROVENTIL HFA;VENTOLIN HFA) 108 (90 BASE) MCG/ACT inhaler Inhale 1-2 puffs into the lungs every 6 (six) hours as needed for wheezing. Patient not taking: Reported on 08/04/2015 08/14/12   Harden Mo, MD  cephALEXin (KEFLEX) 500 MG capsule Take 1 capsule (500 mg total) by mouth 4 (four) times daily. 02/23/16   Comer Locket, PA-C  PredniSONE 5 MG KIT Take 1 kit (5 mg total) by mouth daily after breakfast. Prednisone 5 mg 6 Jessica dosepack.  Take as directed. Patient not taking: Reported on 08/04/2015 08/14/12   Harden Mo, MD   BP 141/77 mmHg  Pulse 53  Temp(Src) 99.4 F (37.4 C) (Oral)  Resp 16  Ht _0  (1.626 m)  Wt 81.846 kg  BMI 30.96 kg/m2  SpO2  98% Physical Exam  Constitutional: She is oriented to person, place, and time. She appears well-developed and well-nourished.  HENT:  Head: Normocephalic and atraumatic.  Mouth/Throat: Oropharynx is clear and moist.  Eyes: Conjunctivae are normal. Pupils are equal, round, and reactive to light. Right eye exhibits no discharge. Left eye exhibits no discharge. No scleral icterus.  Neck: Neck supple.  Cardiovascular: Normal rate, regular rhythm and normal heart sounds.   Pulmonary/Chest: Effort normal and breath sounds normal. No respiratory distress. She has no wheezes. She has no rales.  Abdominal: Soft.  There is no tenderness.  Musculoskeletal: She exhibits no tenderness.  Neurological: She is alert and oriented to person, place, and time.  Cranial Nerves II-XII grossly intact  Skin: Skin is warm and dry. No rash noted.  Area of skin breakdown in between the crease of fourth and fifth toes on right foot. Some purulent drainage expressed. There is mild warmth and erythema surrounding the distal foot. Distal pulses are intact with brisk cap refill.  Psychiatric: She has a normal mood and affect.  Nursing note and vitals reviewed.   ED Course  Procedures (including critical care time) Labs Review Labs Reviewed  CBC WITH DIFFERENTIAL/PLATELET - Abnormal; Notable for the following:    MCH 25.5 (*)    All other components within normal limits  BASIC METABOLIC PANEL    Imaging Review Dg Foot Complete Right  02/23/2016  CLINICAL DATA:  Wound at fifth toe for 3 months, history hypertension, lupus EXAM: RIGHT FOOT COMPLETE - 3+ VIEW COMPARISON:  None FINDINGS: Degenerative changes first MTP joint with overlying soft tissue swelling. Slight osseous demineralization. Remaining joint spaces preserved. No acute fracture, dislocation or bone destruction. Soft tissue swelling fifth toe at extending into RIGHT foot. IMPRESSION: No acute osseous abnormalities. Electronically Signed   By: Lavonia Dana M.D.   On: 02/23/2016 15:37   I have personally reviewed and evaluated these images and lab results as part of my medical decision-making.   EKG Interpretation None      MDM  Patient with wound to the area between right fourth and fifth toe, improving on Keflex therapy. Sent today for reevaluation by podiatry. She does have some minimal yellow drainage, mild warmth, but no overt evidence of cellulitis. X-rays are negative for subcutaneous gas or osteomyelitis. No leukocytosis. Afebrile and hemodynamically stable. Although, she will need blood pressure recheck in 1 week. Discussed with her PCP, will see for  wound recheck on Wednesday at 4 PM. We will represcribed Keflex therapy. Discussed with my attending, Dr. Winfred Leeds who also saw and evaluated patient and agrees with above plan. Final diagnoses:  Wound, open, foot, right, initial encounter        Comer Locket, PA-C 02/23/16 [place], MD 02/23/16 [phone]

## 2016-02-23 NOTE — Discharge Instructions (Signed)
There does not appear to be an emergent cause for your symptoms at this time. Your exam, lab work and x-rays were all reassuring. Continue taking your antibiotics as prescribed. Follow-up with your PCP on Wednesday at 4 PM for a wound recheck. Return to ED for new or worsening symptoms.

## 2016-11-09 ENCOUNTER — Other Ambulatory Visit: Payer: Self-pay | Admitting: Internal Medicine

## 2016-11-09 DIAGNOSIS — Z1231 Encounter for screening mammogram for malignant neoplasm of breast: Secondary | ICD-10-CM

## 2016-12-23 ENCOUNTER — Ambulatory Visit
Admission: RE | Admit: 2016-12-23 | Discharge: 2016-12-23 | Disposition: A | Discharge status: Home / Self Care | Payer: Medicare Other | Source: Ambulatory Visit | Attending: Internal Medicine | Admitting: Internal Medicine

## 2016-12-23 DIAGNOSIS — Z1231 Encounter for screening mammogram for malignant neoplasm of breast: Secondary | ICD-10-CM

## 2017-01-14 ENCOUNTER — Other Ambulatory Visit: Payer: Self-pay | Admitting: Internal Medicine

## 2017-01-14 DIAGNOSIS — E049 Nontoxic goiter, unspecified: Secondary | ICD-10-CM

## 2017-01-27 ENCOUNTER — Ambulatory Visit
Admission: RE | Admit: 2017-01-27 | Discharge: 2017-01-27 | Disposition: A | Discharge status: Home / Self Care | Payer: Medicare Other | Source: Ambulatory Visit | Attending: Internal Medicine | Admitting: Internal Medicine

## 2017-01-27 DIAGNOSIS — E049 Nontoxic goiter, unspecified: Secondary | ICD-10-CM

## 2017-02-18 DIAGNOSIS — M19042 Primary osteoarthritis, left hand: Secondary | ICD-10-CM

## 2017-02-18 DIAGNOSIS — I7300 Raynaud's syndrome without gangrene: Secondary | ICD-10-CM | POA: Insufficient documentation

## 2017-02-18 DIAGNOSIS — Z79899 Other long term (current) drug therapy: Secondary | ICD-10-CM | POA: Insufficient documentation

## 2017-02-18 DIAGNOSIS — M51369 Other intervertebral disc degeneration, lumbar region without mention of lumbar back pain or lower extremity pain: Secondary | ICD-10-CM | POA: Insufficient documentation

## 2017-02-18 DIAGNOSIS — M19071 Primary osteoarthritis, right ankle and foot: Secondary | ICD-10-CM | POA: Insufficient documentation

## 2017-02-18 DIAGNOSIS — M19041 Primary osteoarthritis, right hand: Secondary | ICD-10-CM | POA: Insufficient documentation

## 2017-02-18 DIAGNOSIS — M5136 Other intervertebral disc degeneration, lumbar region: Secondary | ICD-10-CM | POA: Insufficient documentation

## 2017-02-18 DIAGNOSIS — E559 Vitamin D deficiency, unspecified: Secondary | ICD-10-CM | POA: Insufficient documentation

## 2017-02-18 DIAGNOSIS — Z8669 Personal history of other diseases of the nervous system and sense organs: Secondary | ICD-10-CM | POA: Insufficient documentation

## 2017-02-18 DIAGNOSIS — Z8679 Personal history of other diseases of the circulatory system: Secondary | ICD-10-CM | POA: Insufficient documentation

## 2017-02-18 DIAGNOSIS — M19072 Primary osteoarthritis, left ankle and foot: Secondary | ICD-10-CM | POA: Insufficient documentation

## 2017-02-18 DIAGNOSIS — M329 Systemic lupus erythematosus, unspecified: Secondary | ICD-10-CM | POA: Insufficient documentation

## 2017-02-18 NOTE — Progress Notes (Signed)
Office Visit Note  Patient: Jessica Booker             Date of Birth: 1941/04/28           MRN: [id number]             PCP: Dorothyann Peng, MD Referring: Dorothyann Peng, MD Visit Date: 02/22/2017 Occupation: @GUAROCC @    Subjective:  No chief complaint on file.   History of Present Illness: Jessica Booker is a 76 y.o. female with history of systemic lupus erythematosus. Her last visit was in October 2017 at the time she was doing really well and the plan was to reduce her Plaquenil to once a day. She states that she decided to stop the medication and did not come for follow-up visit that she was feeling quite well. She states for the last 2 months she's not feeling as well. she's been experiencing increased fatigue and joint pain. She also experiencing some tingling sensation in her extremities. She's been experiencing Raynaud's phenomenon as well. She denies any joint swelling. She had some discomfort in her right knee joint which is improved.  Activities of Daily Living:  Patient reports morning stiffness for 1 minute.   Patient Denies nocturnal pain.  Difficulty dressing/grooming: Denies Difficulty climbing stairs: Denies Difficulty getting out of chair: Denies Difficulty using hands for taps, buttons, cutlery, and/or writing: Denies   Review of Systems  Constitutional: Positive for fatigue. Negative for night sweats, weight gain, weight loss and weakness.  HENT: Positive for mouth dryness. Negative for mouth sores, trouble swallowing, trouble swallowing and nose dryness.   Eyes: Negative for pain, redness, visual disturbance and dryness.  Respiratory: Negative for cough, shortness of breath and difficulty breathing.   Cardiovascular: Positive for hypertension. Negative for chest pain, palpitations, irregular heartbeat and swelling in legs/feet.  Gastrointestinal: Negative for blood in stool, constipation and diarrhea.  Endocrine: Negative for increased urination.    Genitourinary: Negative for vaginal dryness.  Musculoskeletal: Positive for arthralgias, joint pain and morning stiffness. Negative for joint swelling, myalgias, muscle weakness, muscle tenderness and myalgias.  Skin: Positive for color change. Negative for rash, hair loss, skin tightness, ulcers and sensitivity to sunlight.  Allergic/Immunologic: Negative for susceptible to infections.  Neurological: Negative for dizziness, memory loss and night sweats.  Hematological: Negative for swollen glands.  Psychiatric/Behavioral: Negative for depressed mood and sleep disturbance. The patient is not nervous/anxious.     PMFS History:  Patient Active Problem List   Diagnosis Date Noted  . Systemic lupus erythematosus (HCC) 02/18/2017  . High risk medication use 02/18/2017  . Raynaud's disease without gangrene 02/18/2017  . DDD (degenerative disc disease), lumbar 02/18/2017  . Primary osteoarthritis of both hands 02/18/2017  . Primary osteoarthritis of both feet 02/18/2017  . Vitamin D deficiency 02/18/2017  . History of hypertension 02/18/2017  . History of neuropathy 02/18/2017    Past Medical History:  Diagnosis Date  . Hypertension   . Lupus     Family History  Problem Relation Age of Onset  . Heart attack Mother   . Stomach cancer Father   . Hypertension Sister   . Heart disease Sister    Past Surgical History:  Procedure Laterality Date  . ABDOMINAL HYSTERECTOMY    . CHOLECYSTECTOMY    . SHOULDER ARTHROSCOPY W/ ROTATOR CUFF REPAIR     R shoulder  . TONSILLECTOMY     Social History   Social History Narrative  . No narrative on file  Objective: Vital Signs: BP (!) 142/54   Pulse (!) 57   Resp 12   Ht 5\' 4"  (1.626 m)   Wt 168 lb (76.2 kg)   BMI 28.84 kg/m    Physical Exam  Constitutional: She is oriented to person, place, and time. She appears well-developed and well-nourished.  HENT:  Head: Normocephalic and atraumatic.  Eyes: Conjunctivae and EOM are  normal.  Neck: Normal range of motion.  Cardiovascular: Normal rate, regular rhythm, normal heart sounds and intact distal pulses.   Pulmonary/Chest: Effort normal and breath sounds normal.  Abdominal: Soft. Bowel sounds are normal.  Lymphadenopathy:    She has no cervical adenopathy.  Neurological: She is alert and oriented to person, place, and time.  Skin: Skin is warm and dry. Capillary refill takes less than 2 seconds.  Psychiatric: She has a normal mood and affect. Her behavior is normal.  Nursing note and vitals reviewed.    Musculoskeletal Exam: C-spine and thoracic lumbar spine good range of motion. Shoulder joints although joints wrist joints are good range of motion. She has DIP PIP thickening in her bilateral hands and feet with hammertoes. Hip joints knee joints ankle joints are good range of motion with no synovitis.  CDAI Exam: CDAI Homunculus Exam:   Joint Counts:  CDAI Tender Joint count: 0 CDAI Swollen Joint count: 0  Global Assessments:  Patient Global Assessment: 4 Provider Global Assessment: 1  CDAI Calculated Score: 5    Investigation: Findings:   In May 2016, CBC showed a hemoglobin of 11.6.  Comprehensive metabolic panel was normal.  Sed rate was 24.  Urinalysis was negative.  ANA was positive with a negative titer.  She has a positive Ro antibody.  The rest of the ENA was negative.  Compliments were normal.  Hepatitis panel and TB-Gold were negative.  09/11/2014 normal PLQ eye exam     Imaging: US Thyroid  Result Date: 01/27/2017 CLINICAL DATA:  Goiter. EXAM: THYROID ULTRASOUND TECHNIQUE: Ultrasound examination of the thyroid gland and adjacent soft tissues was performed. COMPARISON:  None. FINDINGS: Parenchymal Echotexture: Mildly heterogenous Isthmus: 0.9 cm in the AP dimension. Right lobe: 5.2 x 1.7 x 2.3 cm Left lobe: 4.6 x 2.3 x 2.4 cm _________________________________________________________ Estimated total number of nodules >/= 1 cm: 3 Number of  spongiform nodules >/=  2 cm not described below (TR1): 0 Number of mixed cystic and solid nodules >/= 1.5 cm not described below (TR2): 0 _________________________________________________________ Nodule # 1: Location: Isthmus; Mid Maximum size: 1.6 cm; Other 2 dimensions: 0.8 x 1.2 cm Composition: solid/almost completely solid (2) Echogenicity: hypoechoic (2) Shape: not taller-than-wide (0) Margins: ill-defined (0) Echogenic foci: none (0) ACR TI-RADS total points: 4. ACR TI-RADS risk category: TR4 (4-6 points). ACR TI-RADS recommendations: **Given size (>/= 1.5 cm) and appearance, fine needle aspiration of this moderately suspicious nodule should be considered based on TI-RADS criteria. _________________________________________________________ Nodule # 2: Location: Right; Superior Maximum size: 1.5 cm; Other 2 dimensions: 1.2 x 1.2 cm Composition: cannot determine (2) Echogenicity: isoechoic (1) Shape: not taller-than-wide (0) Margins: ill-defined (0) Echogenic foci: none (0) ACR TI-RADS total points: 3. ACR TI-RADS risk category: TR3 (3 points). ACR TI-RADS recommendations: *Given size (>/= 1.5 - 2.4 cm) and appearance, a follow-up ultrasound in 1 year should be considered based on TI-RADS criteria. _________________________________________________________ Nodule # 3: Location: Left; Mid Maximum size: 1.8 cm; Other 2 dimensions: 1.7 x 1.5 cm Composition: solid/almost completely solid (2) Echogenicity: isoechoic (1) Shape: not taller-than-wide (0) Margins: ill-defined (  0) Echogenic foci: none (0) ACR TI-RADS total points: 3. ACR TI-RADS risk category: TR3 (3 points). ACR TI-RADS recommendations: *Given size (>/= 1.5 - 2.4 cm) and appearance, a follow-up ultrasound in 1 year should be considered based on TI-RADS criteria. _________________________________________________________ Nodule # 4: Location: Left; Inferior Maximum size: 1.2 cm; Other 2 dimensions: 0.9 x 0.9 cm Composition: mixed cystic and solid (1)  Echogenicity: isoechoic (1) Shape: not taller-than-wide (0) Margins: ill-defined (0) Echogenic foci: none (0) ACR TI-RADS total points: 2. ACR TI-RADS risk category: TR2 (2 points). ACR TI-RADS recommendations: This nodule does NOT meet TI-RADS criteria for biopsy or dedicated follow-up. _________________________________________________________ Left thyroid lobe is diffusely heterogeneous with additional small nodules scattered throughout the left thyroid lobe. IMPRESSION: Bilateral thyroid nodules. Mildly hypoechoic nodule in the isthmus meets criteria for biopsy. This nodule measures up to 1.6 cm and labeled as Nodule #1. Dominant right and dominant left thyroid nodules both meet criteria for 1 year follow-up. The above is in keeping with the ACR TI-RADS recommendations - J Am Coll Radiol 2017;14:587-595. Electronically Signed   By: Richarda OverlieAdam  Henn M.D.   On: 01/27/2017 17:33    Speciality Comments: No specialty comments available.    Procedures:  No procedures performed Allergies: Patient has no known allergies.   Assessment / Plan:     Visit Diagnoses: Systemic lupus erythematosus  ANA, positive Smith, and positive Ro and RNP  -patient has known history of systemic lupus. She was on Plaquenil and had been doing well. She decided to come off Plaquenil about 1-1/2 years ago. She believes she is having a flare with increased fatigue and discomfort. I do not see any synovitis on examination today. I will obtain following labs. I also plan to resume her Plaquenil after the lab work. She will need eye exam on regular basis as well. Plan: Urinalysis, Routine w reflex microscopic, Sedimentation rate, ANA, C3 and C4, [id number] ENA PANEL  High risk medication use - Plaquenil 200 mg po qd( stopped 1 year ago) - Plan: CBC with Differential/Platelet, COMPLETE METABOLIC PANEL WITH GFR  Raynaud's disease without gangrene: She has intermittent issues with Raynauds  when exposed to cold weather.  DDD (degenerative  disc disease), lumbar: She does have some chronic lower back pain  Primary osteoarthritis of both hands: Joint protection and muscle strengthening was discussed.  Primary osteoarthritis of both feet: Due to hammertoes she has chronic discomfort. I have advised to get some proper fitting shoes with arch support.  Vitamin D deficiency - Plan: VITAMIN D 25 Hydroxy today.  History of hypertension: Her systolic blood pressure is high although her diastolic is normal. I've advised her to follow her blood pressure closely she's been followed by PCP for hypertension.  History of neuropathy: She has history of neuropathy. She has paresthesias in her upper extremities and lower extremities.  Other fatigue - Plan: CK, TSH    Orders: Orders Placed This Encounter  Procedures  . CBC with Differential/Platelet  . COMPLETE METABOLIC PANEL WITH GFR  . Urinalysis, Routine w reflex microscopic  . Sedimentation rate  . CK  . TSH  . ANA  . C3 and C4  . CP5000020 ENA PANEL  . VITAMIN D 25 Hydroxy (Vit-D Deficiency, Fractures)   No orders of the defined types were placed in this encounter.   Face-to-face time spent with patient was 30 minutes. 50% of time was spent in counseling and coordination of care.  Follow-Up Instructions: Return in about 5 months (around 07/25/2017) for Systemic  lupus OA DDD.   Pollyann Savoy, MD  Note - This record has been created using Animal nutritionist.  Chart creation errors have been sought, but may not always  have been located. Such creation errors do not reflect on  the standard of medical care.

## 2017-02-21 ENCOUNTER — Ambulatory Visit (HOSPITAL_COMMUNITY)
Admission: RE | Admit: 2017-02-21 | Discharge: 2017-02-21 | Disposition: A | Discharge status: Home / Self Care | Payer: Medicare Other | Source: Ambulatory Visit | Attending: Surgery | Admitting: Surgery

## 2017-02-21 ENCOUNTER — Other Ambulatory Visit (HOSPITAL_COMMUNITY): Payer: Self-pay | Admitting: Internal Medicine

## 2017-02-21 DIAGNOSIS — R42 Dizziness and giddiness: Secondary | ICD-10-CM

## 2017-02-21 DIAGNOSIS — I6523 Occlusion and stenosis of bilateral carotid arteries: Secondary | ICD-10-CM | POA: Diagnosis not present

## 2017-02-21 LAB — VAS US CAROTID
LCCADDIAS: -14 cm/s
LCCAPDIAS: 13 cm/s
LEFT ECA DIAS: -7 cm/s
LEFT VERTEBRAL DIAS: -15 cm/s
LICADDIAS: -16 cm/s
LICAPDIAS: -11 cm/s
LICAPSYS: -57 cm/s
Left CCA dist sys: -77 cm/s
Left CCA prox sys: 104 cm/s
Left ICA dist sys: -93 cm/s
RIGHT CCA MID DIAS: -11 cm/s
RIGHT ECA DIAS: -12 cm/s
RIGHT VERTEBRAL DIAS: -2 cm/s
Right CCA prox dias: 11 cm/s
Right CCA prox sys: 89 cm/s
Right cca dist sys: -52 cm/s

## 2017-02-22 ENCOUNTER — Ambulatory Visit (INDEPENDENT_AMBULATORY_CARE_PROVIDER_SITE_OTHER): Payer: Medicare Other | Admitting: Rheumatology

## 2017-02-22 ENCOUNTER — Encounter: Payer: Self-pay | Admitting: Rheumatology

## 2017-02-22 VITALS — BP 142/54 | HR 57 | Resp 12 | Ht 64.0 in | Wt 168.0 lb

## 2017-02-22 DIAGNOSIS — M19041 Primary osteoarthritis, right hand: Secondary | ICD-10-CM

## 2017-02-22 DIAGNOSIS — M5136 Other intervertebral disc degeneration, lumbar region: Secondary | ICD-10-CM | POA: Diagnosis not present

## 2017-02-22 DIAGNOSIS — I7300 Raynaud's syndrome without gangrene: Secondary | ICD-10-CM

## 2017-02-22 DIAGNOSIS — Z79899 Other long term (current) drug therapy: Secondary | ICD-10-CM

## 2017-02-22 DIAGNOSIS — R5383 Other fatigue: Secondary | ICD-10-CM

## 2017-02-22 DIAGNOSIS — E559 Vitamin D deficiency, unspecified: Secondary | ICD-10-CM | POA: Diagnosis not present

## 2017-02-22 DIAGNOSIS — Z8679 Personal history of other diseases of the circulatory system: Secondary | ICD-10-CM

## 2017-02-22 DIAGNOSIS — M19042 Primary osteoarthritis, left hand: Secondary | ICD-10-CM

## 2017-02-22 DIAGNOSIS — M19072 Primary osteoarthritis, left ankle and foot: Secondary | ICD-10-CM

## 2017-02-22 DIAGNOSIS — M19071 Primary osteoarthritis, right ankle and foot: Secondary | ICD-10-CM

## 2017-02-22 DIAGNOSIS — M3219 Other organ or system involvement in systemic lupus erythematosus: Secondary | ICD-10-CM

## 2017-02-22 DIAGNOSIS — Z8669 Personal history of other diseases of the nervous system and sense organs: Secondary | ICD-10-CM

## 2017-02-22 LAB — CBC WITH DIFFERENTIAL/PLATELET
BASOS PCT: 0 %
Basophils Absolute: 0 cells/uL (ref 0–200)
Eosinophils Absolute: 168 cells/uL (ref 15–500)
Eosinophils Relative: 2 %
HEMATOCRIT: 35.6 % (ref 35.0–45.0)
Hemoglobin: 11.5 g/dL — ABNORMAL LOW (ref 11.7–15.5)
Lymphocytes Relative: 28 %
Lymphs Abs: 2352 cells/uL (ref 850–3900)
MCH: 26.6 pg — ABNORMAL LOW (ref 27.0–33.0)
MCHC: 32.3 g/dL (ref 32.0–36.0)
MCV: 82.4 fL (ref 80.0–100.0)
MONO ABS: 504 {cells}/uL (ref 200–950)
MPV: 9.7 fL (ref 7.5–12.5)
Monocytes Relative: 6 %
NEUTROS ABS: 5376 {cells}/uL (ref 1500–7800)
Neutrophils Relative %: 64 %
Platelets: 288 10*3/uL (ref 140–400)
RBC: 4.32 MIL/uL (ref 3.80–5.10)
RDW: 14.4 % (ref 11.0–15.0)
WBC: 8.4 10*3/uL (ref 3.8–10.8)

## 2017-02-22 LAB — TSH: TSH: 0.89 mIU/L

## 2017-02-22 NOTE — Patient Instructions (Addendum)
Standing Labs We placed an order today for your standing lab work.    Please come back and get your standing labs in 1 month, then every 3 months  We have open lab Monday through Friday from 8:30-11:30 AM and 1:30-4 PM at the office of Dr. Arbutus PedShaili Shanikia Kernodle/Naitik Panwala, PA.   The office is located at [address], [address], [place], [place] [address] No appointment is necessary.   Labs are drawn by [facility].  You may receive a bill from [facility] for your lab work. If you have any questions regarding directions or hours of operation,  please call [phone].    Hydroxychloroquine tablets What is this medicine? HYDROXYCHLOROQUINE (hye drox ee KLOR oh kwin) is used to treat rheumatoid arthritis and systemic lupus erythematosus. It is also used to treat malaria. This medicine may be used for other purposes; ask your health care provider or pharmacist if you have questions. COMMON BRAND NAME(S): Plaquenil, Quineprox What should I tell my health care provider before I take this medicine? They need to know if you have any of these conditions: -diabetes -eye disease, vision problems -G6PD deficiency -history of blood diseases -history of irregular heartbeat -if you often drink alcohol -kidney disease -liver disease -porphyria -psoriasis -seizures -an unusual or allergic reaction to chloroquine, hydroxychloroquine, other medicines, foods, dyes, or preservatives -pregnant or trying to get pregnant -breast-feeding How should I use this medicine? Take this medicine by mouth with a glass of water. Follow the directions on the prescription label. Avoid taking antacids within 4 hours of taking this medicine. It is best to separate these medicines by at least 4 hours. Do not cut, crush or chew this medicine. You can take it with or without food. If it upsets your stomach, take it with food. Take your medicine at regular intervals. Do not take your medicine more often than directed. Take all of  your medicine as directed even if you think you are better. Do not skip doses or stop your medicine early. Talk to your pediatrician regarding the use of this medicine in children. While this drug may be prescribed for selected conditions, precautions do apply. Overdosage: If you think you have taken too much of this medicine contact a poison control center or emergency room at once. NOTE: This medicine is only for you. Do not share this medicine with others. What if I miss a dose? If you miss a dose, take it as soon as you can. If it is almost time for your next dose, take only that dose. Do not take double or extra doses. What may interact with this medicine? Do not take this medicine with any of the following medications: -cisapride -dofetilide -dronedarone -live virus vaccines -penicillamine -pimozide -thioridazine -ziprasidone This medicine may also interact with the following medications: -ampicillin -antacids -cimetidine -cyclosporine -digoxin -medicines for diabetes, like insulin, glipizide, glyburide -medicines for seizures like carbamazepine, phenobarbital, phenytoin -mefloquine -methotrexate -other medicines that prolong the QT interval (cause an abnormal heart rhythm) -praziquantel This list may not describe all possible interactions. Give your health care provider a list of all the medicines, herbs, non-prescription drugs, or dietary supplements you use. Also tell them if you smoke, drink alcohol, or use illegal drugs. Some items may interact with your medicine. What should I watch for while using this medicine? Tell your doctor or healthcare professional if your symptoms do not start to get better or if they get worse. Avoid taking antacids within 4 hours of taking this medicine. It is best  to separate these medicines by at least 4 hours. Tell your doctor or health care professional right away if you have any change in your eyesight. Your vision and blood may be tested  before and during use of this medicine. This medicine can make you more sensitive to the sun. Keep out of the sun. If you cannot avoid being in the sun, wear protective clothing and use sunscreen. Do not use sun lamps or tanning beds/booths. What side effects may I notice from receiving this medicine? Side effects that you should report to your doctor or health care professional as soon as possible: -allergic reactions like skin rash, itching or hives, swelling of the face, lips, or tongue -changes in vision -decreased hearing or ringing of the ears -redness, blistering, peeling or loosening of the skin, including inside the mouth -seizures -sensitivity to light -signs and symptoms of a dangerous change in heartbeat or heart rhythm like chest pain; dizziness; fast or irregular heartbeat; palpitations; feeling faint or lightheaded, falls; breathing problems -signs and symptoms of liver injury like dark yellow or brown urine; general ill feeling or flu-like symptoms; light-colored stools; loss of appetite; nausea; right upper belly pain; unusually weak or tired; yellowing of the eyes or skin -signs and symptoms of low blood sugar such as feeling anxious; confusion; dizziness; increased hunger; unusually weak or tired; sweating; shakiness; cold; irritable; headache; blurred vision; fast heartbeat; loss of consciousness -uncontrollable head, mouth, neck, arm, or leg movements Side effects that usually do not require medical attention (report to your doctor or health care professional if they continue or are bothersome): -anxious -diarrhea -dizziness -hair loss -headache -irritable -loss of appetite -nausea, vomiting -stomach pain This list may not describe all possible side effects. Call your doctor for medical advice about side effects. You may report side effects to FDA at 1-800-FDA-1088. Where should I keep my medicine? Keep out of the reach of children. In children, this medicine can cause  overdose with small doses. Store at room temperature between 15 and 30 degrees C (59 and 86 degrees F). Protect from moisture and light. Throw away any unused medicine after the expiration date. NOTE: This sheet is a summary. It may not cover all possible information. If you have questions about this medicine, talk to your doctor, pharmacist, or health care provider.  2018 Elsevier/Gold Standard (2016-03-31 14:16:15)

## 2017-02-22 NOTE — Progress Notes (Signed)
Pharmacy Note  Subjective: Patient presents today to the [facility] to see Dr. Corliss Skainseveshwar.  Patient is being restarted on hydroxychloroquine.  Patient seen by the pharmacist for counseling on hydroxychloroquine.    Objective: CMP Latest Ref Rng & Units 02/23/2016 01/31/2011  Glucose 65 - 99 mg/dL 93 85  BUN 6 - 20 mg/dL 12 13  Creatinine 1.610.44 - 1.00 mg/dL 0.960.82 0.450.75  Sodium 409135 - 145 mmol/L 139 140  Potassium 3.5 - 5.1 mmol/L 3.5 3.6  Chloride 101 - 111 mmol/L 103 101  CO2 22 - 32 mmol/L 29 31  Calcium 8.9 - 10.3 mg/dL 9.9 9.3  Total Protein 6.0 - 8.3 g/dL - 7.9  Total Bilirubin 0.3 - 1.2 mg/dL - 0.4  Alkaline Phos 39 - 117 U/L - 93  AST 0 - 37 U/L - 18  ALT 0 - 35 U/L - 13   CBC    Component Value Date/Time   WBC 9.3 02/23/2016 1545   RBC 4.86 02/23/2016 1545   HGB 12.4 02/23/2016 1545   HCT 38.8 02/23/2016 1545   PLT 331 02/23/2016 1545   MCV 79.8 02/23/2016 1545   MCH 25.5 (L) 02/23/2016 1545   MCHC 32.0 02/23/2016 1545   RDW 13.4 02/23/2016 1545   LYMPHSABS 2.6 02/23/2016 1545   MONOABS 0.5 02/23/2016 1545   EOSABS 0.1 02/23/2016 1545   BASOSABS 0.0 02/23/2016 1545   Updated CBC, CMP ordered today  Assessment/Plan: Patient was counseled on the purpose, proper use, and adverse effects of hydroxychloroquine including nausea/diarrhea, skin rash, headaches, and sun sensitivity.  Discussed importance of annual eye exams while on hydroxychloroquine to monitor to ocular toxicity and discussed importance of frequent laboratory monitoring.  Provided patient with eye exam form for baseline ophthalmologic exam and standing lab instructions.  Provided patient with educational materials on hydroxychloroquine and answered all questions.  Patient consented to hydroxychloroquine.  Will upload consent in the media tab.  Will plan to start hydroxychloroquine once patient's lab results return.    Lilla Shookachel Dyland Panuco, Pharm.D., BCPS Clinical Pharmacist Pager: [phone] Phone:  [phone] 02/22/2017 2:32 PM

## 2017-02-23 ENCOUNTER — Other Ambulatory Visit: Payer: Self-pay | Admitting: Rheumatology

## 2017-02-23 LAB — CP5000020 ENA PANEL
DS DNA AB: 3 [IU]/mL
ENA SM AB SER-ACNC: NEGATIVE
Ribonucleic Protein(ENA) Antibody, IgG: <1
SCLERODERMA (SCL-70) (ENA) ANTIBODY, IGG: NEGATIVE
SSA (Ro) (ENA) Antibody, IgG: 7.2 — ABNORMAL HIGH
SSB (La) (ENA) Antibody, IgG: <1

## 2017-02-23 LAB — COMPLETE METABOLIC PANEL WITH GFR
ALK PHOS: 76 U/L (ref 33–130)
ALT: 13 U/L (ref 6–29)
AST: 18 U/L (ref 10–35)
Albumin: 3.7 g/dL (ref 3.6–5.1)
BUN: 20 mg/dL (ref 7–25)
CALCIUM: 9 mg/dL (ref 8.6–10.4)
CO2: 23 mmol/L (ref 20–31)
Chloride: 103 mmol/L (ref 98–110)
Creat: 0.88 mg/dL (ref 0.60–0.93)
GFR, Est African American: 74 mL/min (ref >=60)
GFR, Est Non African American: 64 mL/min (ref >=60)
GLUCOSE: 86 mg/dL (ref 65–99)
Potassium: 3.5 mmol/L (ref 3.5–5.3)
Sodium: 138 mmol/L (ref 135–146)
Total Bilirubin: 0.2 mg/dL (ref 0.2–1.2)
Total Protein: 7.1 g/dL (ref 6.1–8.1)

## 2017-02-23 LAB — C3 AND C4
C3 COMPLEMENT: 129 mg/dL (ref 83–193)
C4 Complement: 27 mg/dL (ref 15–57)

## 2017-02-23 LAB — URINALYSIS, ROUTINE W REFLEX MICROSCOPIC
Bilirubin Urine: NEGATIVE
GLUCOSE, UA: NEGATIVE
Hgb urine dipstick: NEGATIVE
KETONES UR: NEGATIVE
Leukocytes, UA: NEGATIVE
Nitrite: NEGATIVE
Protein, ur: NEGATIVE
Specific Gravity, Urine: 1.011 (ref 1.001–1.035)
pH: 5.5 (ref 5.0–8.0)

## 2017-02-23 LAB — VITAMIN D 25 HYDROXY (VIT D DEFICIENCY, FRACTURES): Vit D, 25-Hydroxy: 21 ng/mL — ABNORMAL LOW (ref 30–100)

## 2017-02-23 LAB — SEDIMENTATION RATE: SED RATE: 17 mm/h (ref 0–30)

## 2017-02-23 LAB — ANA: Anti Nuclear Antibody(ANA): NEGATIVE

## 2017-02-23 LAB — CK: Total CK: 291 U/L — ABNORMAL HIGH (ref 29–143)

## 2017-02-23 NOTE — Progress Notes (Signed)
Will discuss labs at follow-up visit. She has vitamin D deficiency. Please call vitamin D 50,000 units by mouth every week total of 30 days supply. Repeat vitamin D in 3 months

## 2017-02-23 NOTE — Telephone Encounter (Signed)
Patient called stating that Dr. Corliss Skainseveshwar was going to send in her PLQ to the pharmacy.  She called the pharmacy and they told her that her inhaler was called in, not the PLQ.  Also, she wants to make sure her weight is correct in her chart. . Her AVS states 168lbs, she thinks the numbers have been transposed.

## 2017-02-23 NOTE — Telephone Encounter (Signed)
Attempted to contact the patient and left message for patient to call the office.  

## 2017-02-24 MED ORDER — HYDROXYCHLOROQUINE SULFATE 200 MG PO TABS: ORAL_TABLET | ORAL | 0 refills | Status: DC

## 2017-02-24 MED ORDER — VITAMIN D (ERGOCALCIFEROL) 1.25 MG (50000 UNIT) PO CAPS: 50000.0000 [IU] | ORAL_CAPSULE | ORAL | 0 refills | Status: DC

## 2017-02-24 NOTE — Telephone Encounter (Signed)
Patient advised of lab results and prescription for Vitamin D and PLQ sent to pharmacy. Per Mr. Leane Callanwala PLQ 200 mg 1 po BID Monday thru Friday sent to the pharmacy.

## 2017-03-18 ENCOUNTER — Other Ambulatory Visit: Payer: Self-pay | Admitting: Internal Medicine

## 2017-03-18 DIAGNOSIS — E041 Nontoxic single thyroid nodule: Secondary | ICD-10-CM

## 2017-03-23 ENCOUNTER — Other Ambulatory Visit: Payer: Self-pay | Admitting: Rheumatology

## 2017-03-24 ENCOUNTER — Other Ambulatory Visit: Payer: Self-pay | Admitting: *Deleted

## 2017-03-24 DIAGNOSIS — Z79899 Other long term (current) drug therapy: Secondary | ICD-10-CM

## 2017-03-24 LAB — CBC WITH DIFFERENTIAL/PLATELET
BASOS PCT: 0 %
Basophils Absolute: 0 cells/uL (ref 0–200)
EOS ABS: 88 {cells}/uL (ref 15–500)
Eosinophils Relative: 1 %
HEMATOCRIT: 36.6 % (ref 35.0–45.0)
HEMOGLOBIN: 11.8 g/dL (ref 11.7–15.5)
LYMPHS ABS: 2200 {cells}/uL (ref 850–3900)
Lymphocytes Relative: 25 %
MCH: 26.5 pg — ABNORMAL LOW (ref 27.0–33.0)
MCHC: 32.2 g/dL (ref 32.0–36.0)
MCV: 82.2 fL (ref 80.0–100.0)
MONO ABS: 528 {cells}/uL (ref 200–950)
MPV: 10.1 fL (ref 7.5–12.5)
Monocytes Relative: 6 %
NEUTROS ABS: 5984 {cells}/uL (ref 1500–7800)
Neutrophils Relative %: 68 %
Platelets: 327 10*3/uL (ref 140–400)
RBC: 4.45 MIL/uL (ref 3.80–5.10)
RDW: 14.2 % (ref 11.0–15.0)
WBC: 8.8 10*3/uL (ref 3.8–10.8)

## 2017-03-24 LAB — COMPLETE METABOLIC PANEL WITH GFR
ALT: 12 U/L (ref 6–29)
AST: 15 U/L (ref 10–35)
Albumin: 4 g/dL (ref 3.6–5.1)
Alkaline Phosphatase: 77 U/L (ref 33–130)
BUN: 17 mg/dL (ref 7–25)
CO2: 27 mmol/L (ref 20–31)
CREATININE: 0.95 mg/dL — AB (ref 0.60–0.93)
Calcium: 9.2 mg/dL (ref 8.6–10.4)
Chloride: 102 mmol/L (ref 98–110)
GFR, EST AFRICAN AMERICAN: 67 mL/min (ref >=60)
GFR, Est Non African American: 58 mL/min — ABNORMAL LOW (ref >=60)
Glucose, Bld: 88 mg/dL (ref 65–99)
Potassium: 4.5 mmol/L (ref 3.5–5.3)
Sodium: 139 mmol/L (ref 135–146)
TOTAL PROTEIN: 7.4 g/dL (ref 6.1–8.1)
Total Bilirubin: 0.3 mg/dL (ref 0.2–1.2)

## 2017-03-25 NOTE — Progress Notes (Signed)
stable °

## 2017-04-07 ENCOUNTER — Ambulatory Visit
Admission: RE | Admit: 2017-04-07 | Discharge: 2017-04-07 | Disposition: A | Discharge status: Home / Self Care | Payer: Medicare Other | Source: Ambulatory Visit | Attending: Internal Medicine | Admitting: Internal Medicine

## 2017-04-07 ENCOUNTER — Other Ambulatory Visit (HOSPITAL_COMMUNITY)
Admission: RE | Admit: 2017-04-07 | Discharge: 2017-04-07 | Disposition: A | Discharge status: Home / Self Care | Payer: Medicare Other | Source: Ambulatory Visit | Attending: Radiology | Admitting: Radiology

## 2017-04-07 DIAGNOSIS — E041 Nontoxic single thyroid nodule: Secondary | ICD-10-CM

## 2017-05-16 ENCOUNTER — Other Ambulatory Visit: Payer: Self-pay | Admitting: Rheumatology

## 2017-05-16 DIAGNOSIS — E559 Vitamin D deficiency, unspecified: Secondary | ICD-10-CM

## 2017-05-17 NOTE — Telephone Encounter (Signed)
Patient advised we will need to check her Vitamin D level before the prescription can be refilled. Patient verbalized understanding.

## 2017-06-30 IMAGING — CR DG FOOT COMPLETE 3+V*R*
3 series · 3 of 3 positions shown · non-contrast
Comparison: None

CLINICAL DATA: Wound at fifth toe for 3 months, history
hypertension, lupus

EXAM:
RIGHT FOOT COMPLETE - 3+ VIEW

[foot ap]
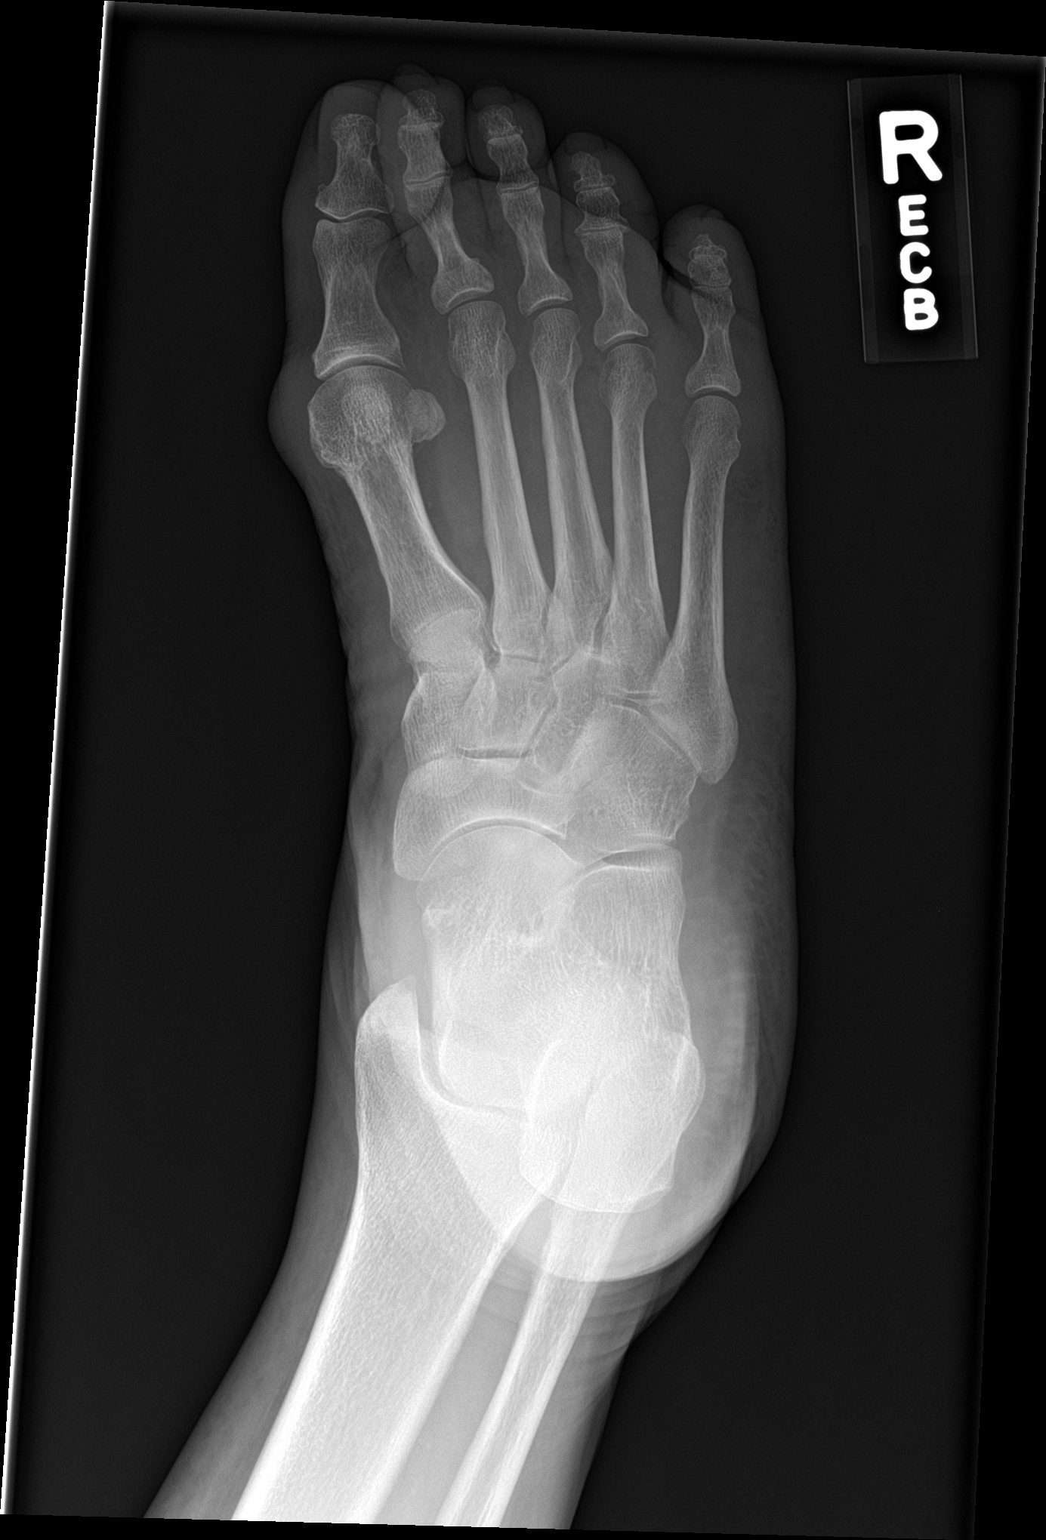

[foot obl]
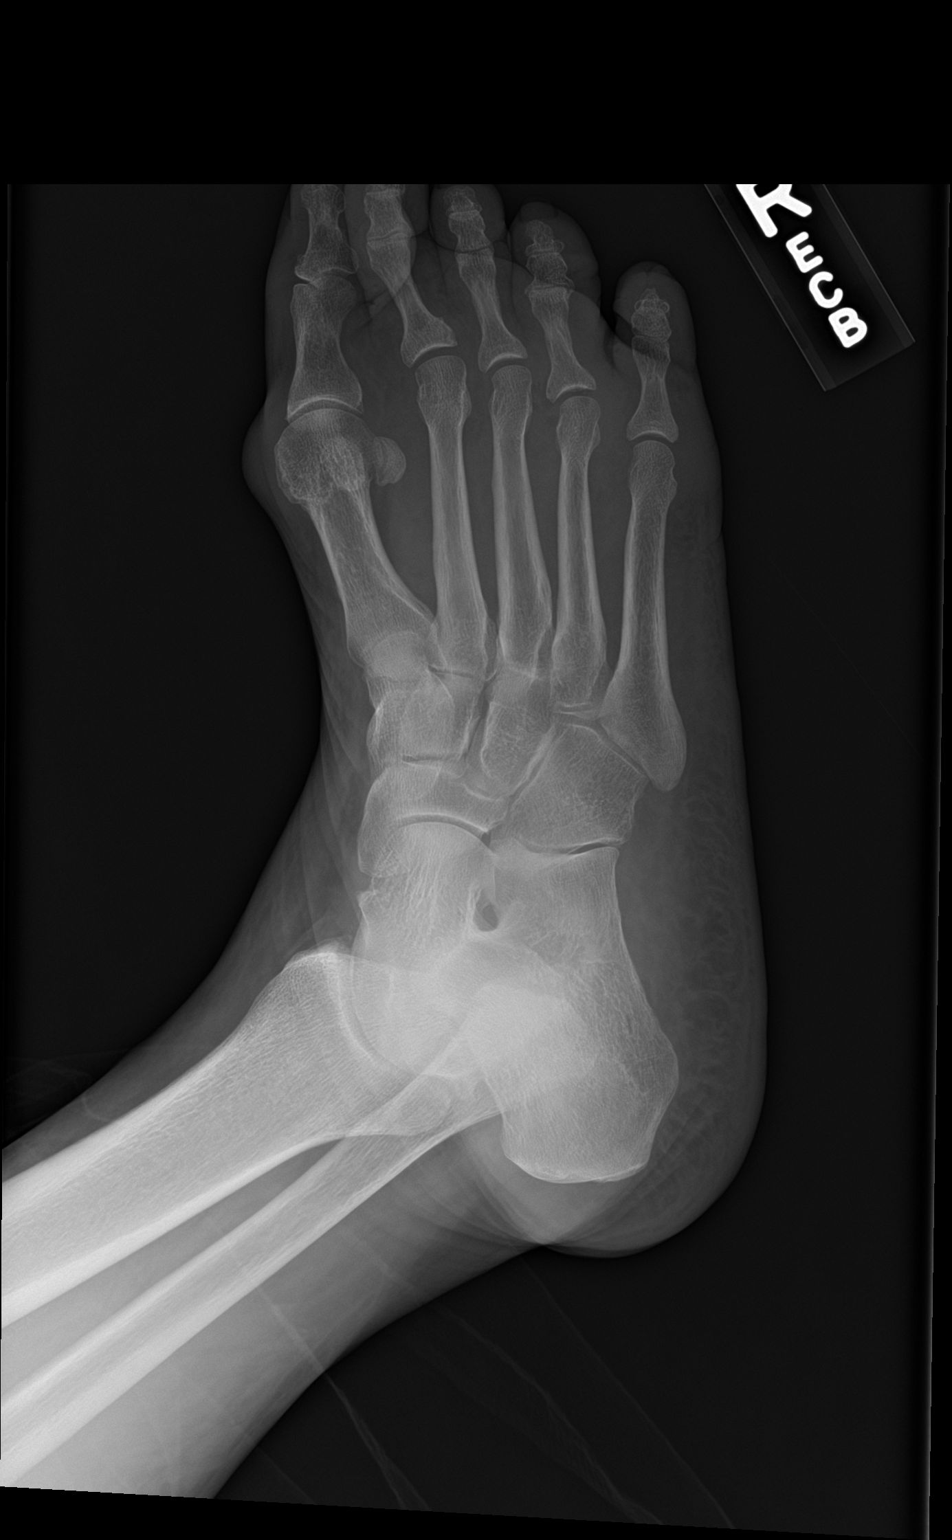

[foot lat]
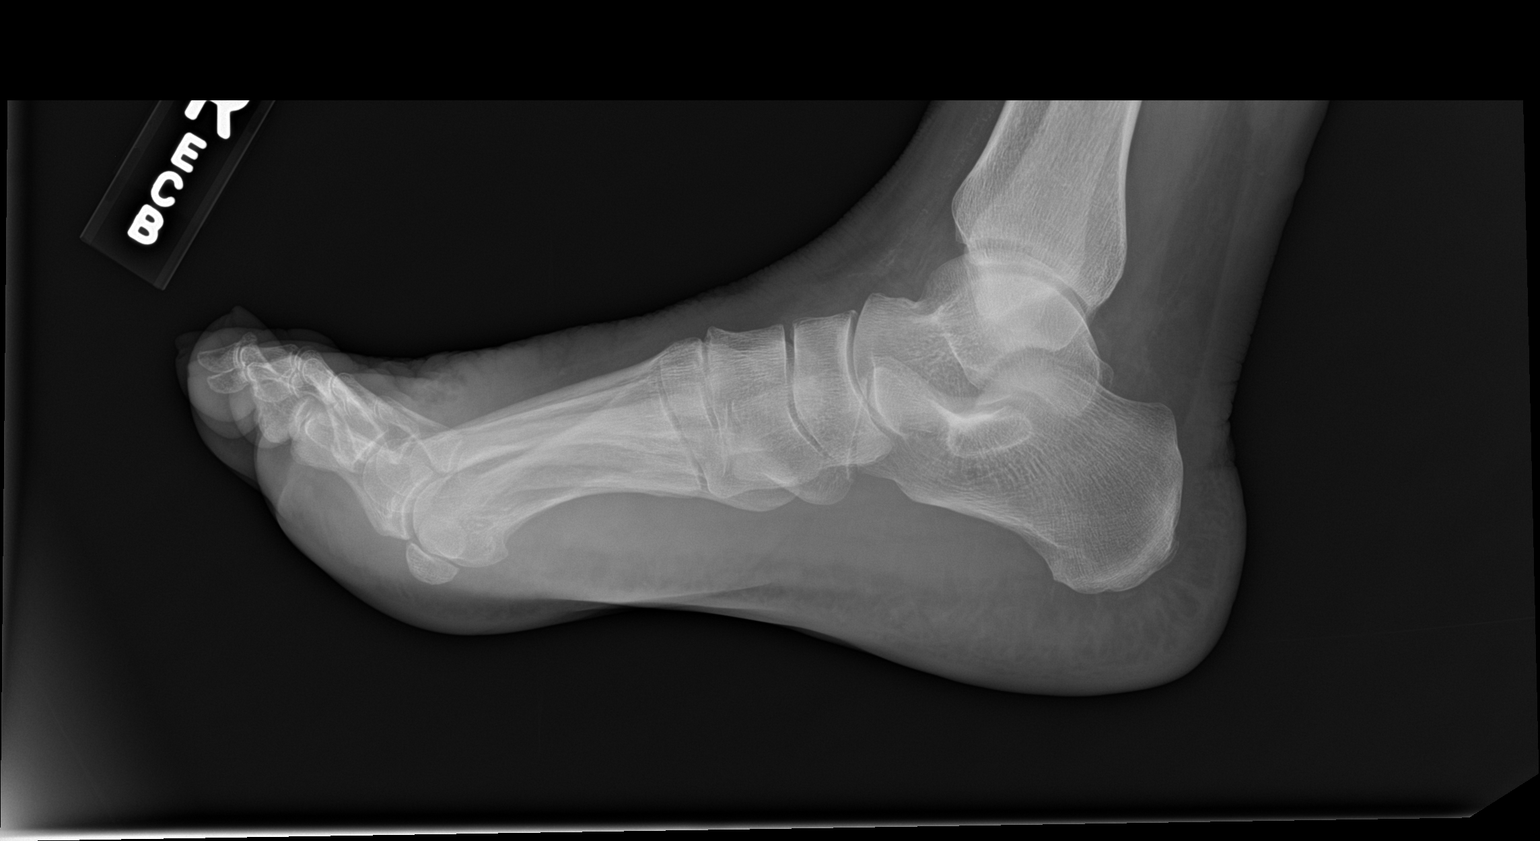

[3 of 3 positions shown; findings below may reference images not displayed]

FINDINGS: Degenerative changes first MTP joint with overlying soft tissue
swelling.

Slight osseous demineralization.

Remaining joint spaces preserved.

No acute fracture, dislocation or bone destruction.

Soft tissue swelling fifth toe at extending into RIGHT foot.
IMPRESSION: No acute osseous abnormalities.

## 2017-07-17 NOTE — Progress Notes (Signed)
Office Visit Note  Patient: Jessica Booker             Date of Birth: March 21, 1941           MRN: [id number]             PCP: Dorothyann PengSanders, Robyn, MD Referring: Dorothyann PengSanders, Robyn, MD Visit Date: 07/27/2017 Occupation: @GUAROCC @    Subjective:  Right knee pain  History of Present Illness: Jessica MichaelsGloria D Booker is a 76 y.o. female with history of systemic lupus , osteoarthritis and disc disease. She states her husband was hospitalized and she has been with him for long time. And now since he is home she's been taking care of him and this has put extra strain on her body. She's been having pain and discomfort in her right trochanteric area and also her right knee joint. She continues to have some lower back discomfort. She is some discomfort in her hands and feet. She's suffering from upper respiratory tract infection currently.  Activities of Daily Living:  Patient reports morning stiffness for 3 minute.   Patient Denies nocturnal pain.  Difficulty dressing/grooming: Denies Difficulty climbing stairs: Reports Difficulty getting out of chair: Reports Difficulty using hands for taps, buttons, cutlery, and/or writing: Denies   Review of Systems  Constitutional: Positive for fatigue. Negative for night sweats, weight gain, weight loss and weakness.  HENT: Positive for mouth dryness. Negative for mouth sores, trouble swallowing, trouble swallowing and nose dryness.   Eyes: Negative for pain, redness, visual disturbance and dryness.  Respiratory: Negative for cough, shortness of breath and difficulty breathing.   Cardiovascular: Negative for chest pain, palpitations, hypertension, irregular heartbeat and swelling in legs/feet.  Gastrointestinal: Negative for blood in stool, constipation and diarrhea.  Endocrine: Negative for increased urination.  Genitourinary: Negative for vaginal dryness.  Musculoskeletal: Positive for arthralgias, joint pain, myalgias, morning stiffness and myalgias. Negative for  joint swelling, muscle weakness and muscle tenderness.  Skin: Positive for color change. Negative for rash, hair loss, skin tightness, ulcers and sensitivity to sunlight.  Allergic/Immunologic: Negative for susceptible to infections.  Neurological: Negative for dizziness, memory loss and night sweats.  Hematological: Negative for swollen glands.  Psychiatric/Behavioral: Positive for sleep disturbance. Negative for depressed mood. The patient is not nervous/anxious.     PMFS History:  Patient Active Problem List   Diagnosis Date Noted  . Systemic lupus erythematosus (HCC) 02/18/2017  . High risk medication use 02/18/2017  . Raynaud's disease without gangrene 02/18/2017  . DDD (degenerative disc disease), lumbar 02/18/2017  . Primary osteoarthritis of both hands 02/18/2017  . Primary osteoarthritis of both feet 02/18/2017  . Vitamin D deficiency 02/18/2017  . History of hypertension 02/18/2017  . History of neuropathy 02/18/2017    Past Medical History:  Diagnosis Date  . Hypertension   . Lupus     Family History  Problem Relation Age of Onset  . Heart attack Mother   . Stomach cancer Father   . Hypertension Sister   . Heart disease Sister    Past Surgical History:  Procedure Laterality Date  . ABDOMINAL HYSTERECTOMY    . CHOLECYSTECTOMY    . SHOULDER ARTHROSCOPY W/ ROTATOR CUFF REPAIR     R shoulder  . TONSILLECTOMY     Social History   Social History Narrative  . Not on file     Objective: Vital Signs: BP 140/70 (BP Location: Right Arm, Patient Position: Sitting, Cuff Size: Normal)   Pulse 84   Resp 17  Ht 5' 4.5" (1.638 m)   Wt 197 lb (89.4 kg)   BMI 33.29 kg/m    Physical Exam  Constitutional: She is oriented to person, place, and time. She appears well-developed and well-nourished.  HENT:  Head: Normocephalic and atraumatic.  Eyes: Conjunctivae and EOM are normal.  Neck: Normal range of motion.  Cardiovascular: Normal rate, regular rhythm, normal  heart sounds and intact distal pulses.  Pulmonary/Chest: Effort normal and breath sounds normal.  Abdominal: Soft. Bowel sounds are normal.  Lymphadenopathy:    She has no cervical adenopathy.  Neurological: She is alert and oriented to person, place, and time.  Skin: Skin is warm and dry. Capillary refill takes less than 2 seconds.  Psychiatric: She has a normal mood and affect. Her behavior is normal.  Nursing note and vitals reviewed.    Musculoskeletal Exam: C-spine and thoracic lumbar spine limited range of motion. Shoulder joints elbow joints wrist joint MCPs PIPs DIPs are good range of motion with no synovitis. She had painful range of motion of her right joint and right knee joint without any warmth swelling or effusion. Ankle joints MTPs PIPs with good range of motion with no synovitis.     Investigation: No additional findings.PLQ eye exam: ? CBC Latest Ref Rng & Units 07/25/2017 03/24/2017 02/22/2017  WBC 3.8 - 10.8 Thousand/uL 9.0 8.8 8.4  Hemoglobin 11.7 - 15.5 g/dL 98.1 19.1 11.5(L)  Hematocrit 35.0 - 45.0 % 36.2 36.6 35.6  Platelets 140 - 400 Thousand/uL 310 327 288   CMP Latest Ref Rng & Units 07/25/2017 03/24/2017 02/22/2017  Glucose 65 - 99 mg/dL 76 88 86  BUN 7 - 25 mg/dL 14 17 20   Creatinine 0.60 - 0.93 mg/dL 4.78 2.95(A) 2.13  Sodium 135 - 146 mmol/L 139 139 138  Potassium 3.5 - 5.3 mmol/L 4.1 4.5 3.5  Chloride 98 - 110 mmol/L 102 102 103  CO2 20 - 32 mmol/L 33(H) 27 23  Calcium 8.6 - 10.4 mg/dL 9.2 9.2 9.0  Total Protein 6.1 - 8.1 g/dL 7.7 7.4 7.1  Total Bilirubin 0.2 - 1.2 mg/dL 0.4 0.3 0.2  Alkaline Phos 33 - 130 U/L - 77 76  AST 10 - 35 U/L 17 15 18   ALT 6 - 29 U/L 12 12 13     Imaging: Xr Hip Unilat W Or W/o Pelvis 2-3 Views Right  Result Date: 07/27/2017 No significant joint space narrowing noted. No chondrocalcinosis noted.   Xr Knee 3 View Right  Result Date: 07/27/2017 Mild medial compartment narrowing, intercondylar osteophytes noted. No  chondrocalcinosis noted. Moderate patellofemoral narrowing noted. Impression: Mild osteoarthritis and moderate chondromalacia patella.   Speciality Comments: No specialty comments available.    Procedures:  No procedures performed Allergies: Patient has no known allergies.   Assessment / Plan:     Visit Diagnoses: Other systemic lupus erythematosus with other organ involvement (HCC) - ANA, positive Smith, and positive Ro and RNP  -she has no synovitis on examination.  High risk medication use - PLQ po bid Monday-Friday . Patient has not scheduled an eye exam yet. Patient reports that she will schedule appointment with Dr. Randon Goldsmith.  Raynaud's disease without gangrene - She has intermittent issues with Raynauds  when exposed to cold weather.  Pain in right hip - Plan: XR HIP UNILAT W OR W/O PELVIS 2-3 VIEWS RIGHT. The x-ray of the hip joint was unremarkable. At least some of the pain could be muscular.  Chronic pain of right knee - Plan: XR KNEE  3 VIEW RIGHT. The x-ray showed mild osteoarthritis and chondromalacia patella. Knee joint and muscle strengthening exercises were discussed. She has increased stress on her knee joint and hip joint as she's been taking care of her husband who had recent amputation of his leg.  DDD (degenerative disc disease), lumbar: Chronic pain  Primary osteoarthritis of both hands: She has some stiffness  Primary osteoarthritis of both feet - Due to hammertoes she has chronic discomfort  History of hypertension - I've advised her to follow her blood pressure closely she's been followed by PCP for hypertension.  Vitamin D deficiency: She was given prescription for vitamin D 50,000 units once a week. We will check her level in 3 months.  History of neuropathy - She has history of neuropathy. She has paresthesias in her upper extremities and lower extremities.    Orders: Orders Placed This Encounter  Procedures  . XR HIP UNILAT W OR W/O PELVIS 2-3 VIEWS  RIGHT  . XR KNEE 3 VIEW RIGHT   Meds ordered this encounter  Medications  . Vitamin D, Ergocalciferol, (DRISDOL) 50000 units CAPS capsule    Sig: Take 1 capsule (50,000 Units total) by mouth every 7 (seven) days.    Dispense:  12 capsule    Refill:  0    Face-to-face time spent with patient was 30 minutes. Greater than 50% of time was spent in counseling and coordination of care.  Follow-Up Instructions: Return in about 5 months (around 12/25/2017) for Systemic lupus, Osteoarthritis.   Pollyann SavoyShaili Ludella Pranger, MD  Note - This record has been created using Animal nutritionistDragon software.  Chart creation errors have been sought, but may not always  have been located. Such creation errors do not reflect on  the standard of medical care.

## 2017-07-25 ENCOUNTER — Other Ambulatory Visit: Payer: Self-pay | Admitting: *Deleted

## 2017-07-25 ENCOUNTER — Other Ambulatory Visit: Payer: Self-pay

## 2017-07-25 DIAGNOSIS — Z79899 Other long term (current) drug therapy: Secondary | ICD-10-CM

## 2017-07-25 DIAGNOSIS — E559 Vitamin D deficiency, unspecified: Secondary | ICD-10-CM

## 2017-07-26 LAB — CBC WITH DIFFERENTIAL/PLATELET
BASOS PCT: 0.3 %
Basophils Absolute: 27 cells/uL (ref 0–200)
EOS PCT: 3.2 %
Eosinophils Absolute: 288 cells/uL (ref 15–500)
HCT: 36.2 % (ref 35.0–45.0)
Hemoglobin: 11.8 g/dL (ref 11.7–15.5)
Lymphs Abs: 2313 cells/uL (ref 850–3900)
MCH: 26.1 pg — ABNORMAL LOW (ref 27.0–33.0)
MCHC: 32.6 g/dL (ref 32.0–36.0)
MCV: 80.1 fL (ref 80.0–100.0)
MONOS PCT: 7.7 %
MPV: 10.4 fL (ref 7.5–12.5)
Neutro Abs: 5679 cells/uL (ref 1500–7800)
Neutrophils Relative %: 63.1 %
PLATELETS: 310 10*3/uL (ref 140–400)
RBC: 4.52 10*6/uL (ref 3.80–5.10)
RDW: 13.1 % (ref 11.0–15.0)
TOTAL LYMPHOCYTE: 25.7 %
WBC mixed population: 693 cells/uL (ref 200–950)
WBC: 9 10*3/uL (ref 3.8–10.8)

## 2017-07-26 LAB — COMPLETE METABOLIC PANEL WITH GFR
AG Ratio: 1 (calc) (ref 1.0–2.5)
ALBUMIN MSPROF: 3.9 g/dL (ref 3.6–5.1)
ALKALINE PHOSPHATASE (APISO): 81 U/L (ref 33–130)
ALT: 12 U/L (ref 6–29)
AST: 17 U/L (ref 10–35)
BUN: 14 mg/dL (ref 7–25)
CO2: 33 mmol/L — AB (ref 20–32)
CREATININE: 0.65 mg/dL (ref 0.60–0.93)
Calcium: 9.2 mg/dL (ref 8.6–10.4)
Chloride: 102 mmol/L (ref 98–110)
GFR, Est African American: 100 mL/min/{1.73_m2} (ref >=60)
GFR, Est Non African American: 86 mL/min/{1.73_m2} (ref >=60)
GLUCOSE: 76 mg/dL (ref 65–99)
Globulin: 3.8 g/dL (calc) — ABNORMAL HIGH (ref 1.9–3.7)
Potassium: 4.1 mmol/L (ref 3.5–5.3)
Sodium: 139 mmol/L (ref 135–146)
Total Bilirubin: 0.4 mg/dL (ref 0.2–1.2)
Total Protein: 7.7 g/dL (ref 6.1–8.1)

## 2017-07-26 LAB — VITAMIN D 25 HYDROXY (VIT D DEFICIENCY, FRACTURES): Vit D, 25-Hydroxy: 24 ng/mL — ABNORMAL LOW (ref 30–100)

## 2017-07-26 NOTE — Progress Notes (Signed)
Labs are stable. She has vitamin D deficiency. Please call and vitamin D 50,000 units once a week for 3 months. Recheck vitamin D level in 3 months.

## 2017-07-27 ENCOUNTER — Ambulatory Visit (INDEPENDENT_AMBULATORY_CARE_PROVIDER_SITE_OTHER): Payer: Self-pay

## 2017-07-27 ENCOUNTER — Encounter: Payer: Self-pay | Admitting: Rheumatology

## 2017-07-27 ENCOUNTER — Ambulatory Visit: Payer: Medicare Other | Admitting: Rheumatology

## 2017-07-27 VITALS — BP 140/70 | HR 84 | Resp 17 | Ht 64.5 in | Wt 197.0 lb

## 2017-07-27 DIAGNOSIS — M19042 Primary osteoarthritis, left hand: Secondary | ICD-10-CM

## 2017-07-27 DIAGNOSIS — M25551 Pain in right hip: Secondary | ICD-10-CM

## 2017-07-27 DIAGNOSIS — M5136 Other intervertebral disc degeneration, lumbar region: Secondary | ICD-10-CM | POA: Diagnosis not present

## 2017-07-27 DIAGNOSIS — I7300 Raynaud's syndrome without gangrene: Secondary | ICD-10-CM

## 2017-07-27 DIAGNOSIS — M25561 Pain in right knee: Secondary | ICD-10-CM | POA: Diagnosis not present

## 2017-07-27 DIAGNOSIS — M3219 Other organ or system involvement in systemic lupus erythematosus: Secondary | ICD-10-CM

## 2017-07-27 DIAGNOSIS — G8929 Other chronic pain: Secondary | ICD-10-CM | POA: Diagnosis not present

## 2017-07-27 DIAGNOSIS — Z8669 Personal history of other diseases of the nervous system and sense organs: Secondary | ICD-10-CM

## 2017-07-27 DIAGNOSIS — E559 Vitamin D deficiency, unspecified: Secondary | ICD-10-CM | POA: Diagnosis not present

## 2017-07-27 DIAGNOSIS — Z79899 Other long term (current) drug therapy: Secondary | ICD-10-CM

## 2017-07-27 DIAGNOSIS — Z8679 Personal history of other diseases of the circulatory system: Secondary | ICD-10-CM | POA: Diagnosis not present

## 2017-07-27 DIAGNOSIS — M19071 Primary osteoarthritis, right ankle and foot: Secondary | ICD-10-CM | POA: Diagnosis not present

## 2017-07-27 DIAGNOSIS — M19041 Primary osteoarthritis, right hand: Secondary | ICD-10-CM

## 2017-07-27 DIAGNOSIS — M19072 Primary osteoarthritis, left ankle and foot: Secondary | ICD-10-CM

## 2017-07-27 MED ORDER — VITAMIN D (ERGOCALCIFEROL) 1.25 MG (50000 UNIT) PO CAPS: 50000.0000 [IU] | ORAL_CAPSULE | ORAL | 0 refills | Status: DC

## 2017-07-27 NOTE — Progress Notes (Signed)
Vitamin D OH 25 from 07/25/17 was 24; patient has stopped taking Vitamin D2 50,000 IU, cannot recall last dose.   Has not seen eye doctor yet for PLQ eye exam. Gave her a eye exam form.

## 2017-07-27 NOTE — Patient Instructions (Signed)
Vitamin D level in 3 months after starting vitamin D.

## 2017-08-12 ENCOUNTER — Telehealth: Payer: Self-pay | Admitting: *Deleted

## 2017-08-12 ENCOUNTER — Encounter: Payer: Self-pay | Admitting: *Deleted

## 2017-08-12 ENCOUNTER — Telehealth: Payer: Self-pay

## 2017-08-12 DIAGNOSIS — E559 Vitamin D deficiency, unspecified: Secondary | ICD-10-CM

## 2017-08-12 NOTE — Telephone Encounter (Signed)
Patient would like a call concerning lab results.  Cb# is [phone].  Please advise.  Thank you.

## 2017-08-12 NOTE — Telephone Encounter (Signed)
error 

## 2017-08-12 NOTE — Telephone Encounter (Signed)
-----   Message from Pollyann SavoyShaili Deveshwar, MD sent at 07/26/2017  1:27 PM EST ----- Labs are stable. She has vitamin D deficiency. Please call and vitamin D 50,000 units once a week for 3 months. Recheck vitamin D level in 3 months.

## 2017-08-15 NOTE — Addendum Note (Signed)
Addended by: Henriette CombsHATTON, ANDREA L on: 08/15/2017 10:26 AM   Modules accepted: Orders

## 2017-08-15 NOTE — Telephone Encounter (Signed)
Patient advised of lab results and patient has started vitamin D that was sent to the pharmacy. Patient advised to have it rechecked in 3 months.

## 2017-08-25 ENCOUNTER — Ambulatory Visit: Payer: Medicare Other | Admitting: Internal Medicine

## 2017-08-25 ENCOUNTER — Encounter: Payer: Self-pay | Admitting: Internal Medicine

## 2017-08-25 ENCOUNTER — Ambulatory Visit (INDEPENDENT_AMBULATORY_CARE_PROVIDER_SITE_OTHER)
Admission: RE | Admit: 2017-08-25 | Discharge: 2017-08-25 | Disposition: A | Discharge status: Home / Self Care | Payer: Medicare Other | Source: Ambulatory Visit | Attending: Internal Medicine | Admitting: Internal Medicine

## 2017-08-25 VITALS — BP 136/64 | HR 66 | Ht 64.5 in | Wt 190.0 lb

## 2017-08-25 DIAGNOSIS — J45991 Cough variant asthma: Secondary | ICD-10-CM

## 2017-08-25 MED ORDER — BUDESONIDE-FORMOTEROL FUMARATE 80-4.5 MCG/ACT IN AERO: 2.0000 | INHALATION_SPRAY | Freq: Two times a day (BID) | RESPIRATORY_TRACT | 0 refills | Status: DC

## 2017-08-25 MED ORDER — BUDESONIDE-FORMOTEROL FUMARATE 80-4.5 MCG/ACT IN AERO: INHALATION_SPRAY | RESPIRATORY_TRACT | Status: DC

## 2017-08-25 NOTE — Patient Instructions (Addendum)
Plan A = Automatic = symbicort 80 Take 2 puffs first thing in am and then another 2 puffs about 12 hours later.    Work on inhaler technique:  relax and gently blow all the way out then take a nice smooth deep breath back in, triggering the inhaler at same time you start breathing in.  Hold for up to 5 seconds if you can. Blow out thru nose. Rinse and gargle with water when done      Plan B = Backup Only use your albuterol as a rescue medication to be used if you can't catch your breath by resting or doing a relaxed purse lip breathing pattern.  - The less you use it, the better it will work when you need it. - Ok to use the inhaler up to 2 puffs  every 4 hours if you must but call for appointment if use goes up over your usual need - Don't leave home without it !!  (think of it like the spare tire for your car)   For next flare of cough >>  Try prilosec otc 20mg   Take 30-60 min before first meal of the day and Pepcid ac (famotidine) 20 mg one @  bedtime until cough is completely gone for at least a week without the need for cough suppression  GERD (REFLUX)  is an extremely common cause of respiratory symptoms just like yours , many times with no obvious heartburn at all.    It can be treated with medication, but also with lifestyle changes including elevation of the head of your bed (ideally with 6 inch  bed blocks),  Smoking cessation, avoidance of late meals, excessive alcohol, and avoid fatty foods, chocolate, peppermint, colas, red wine, and acidic juices such as orange juice.  NO MINT OR MENTHOL PRODUCTS SO NO COUGH DROPS  USE SUGARLESS CANDY INSTEAD (Jolley ranchers or Stover's or Life Savers) or even ice chips will also do - the key is to swallow to prevent all throat clearing. NO OIL BASED VITAMINS - use powdered substitutes.     Please schedule a follow up office visit in 4  weeks, call sooner if needed with all medications /inhalers/ solutions in hand so we can verify exactly what  you are taking. This includes all medications from all doctors and over the counters

## 2017-08-25 NOTE — Progress Notes (Signed)
Subjective:     Patient ID: Jessica Booker, female   DOB: 03/08/1941,    MRN: [id number]  HPI  [age] yobf never smoker dx with SLE in her 7140's with no resp symptoms  until around 2016 noted cold would settle in chest requiring pred/abx with increasing frequency esp since 2018 referred to pulmonary clinic 08/25/2017 by Jessica Booker    08/25/2017 1st [facility] office visit/ Jessica Booker   Chief Complaint  Patient presents with  . Pulm Consult    Referred by Jessica Booker for frequent cases of bronchitis. Per patient, she has had bronchitis at least 4 times this year. Has been on abx and prednisone for each episode. Has been using vinegar and honey, which has seemed to work best compared to abx and prednisone.    just finished another round of abx and prednisone  2 weeks prior and back to her baseline p using honey and vinegar . 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 No unusual exposure hx or h/o childhood pna/ asthma or knowledge of premature birth.  Sleeping ok flat without nocturnal  or early am exacerbation  of respiratory  c/o's or need for noct saba. Also denies any obvious fluctuation of symptoms with weather or environmental changes or other aggravating or alleviating factors except as outlined above   Current Allergies, Complete Past Medical History, Past Surgical History, Family History, and Social History were reviewed in [facility] electronic medical record.  ROS  The following are not active complaints unless bolded Hoarseness, sore throat,  dysphagia, dental problems, itching, sneezing,  nasal congestion or discharge of excess mucus or purulent secretions, ear ache,   fever, chills, sweats, unintended wt loss or wt gain, classically pleuritic or exertional cp,  orthopnea pnd or leg swelling, presyncope, palpitations, abdominal pain, anorexia, nausea, vomiting, diarrhea  or change in bowel habits or change in bladder habits, change in stools or change in urine, dysuria, hematuria,  rash, arthralgias, visual complaints, headache, numbness, weakness or ataxia or problems with walking or coordination,  change in mood/affect or memory.        Current Meds  Medication Sig  . albuterol (PROVENTIL HFA;VENTOLIN HFA) 108 (90 BASE) MCG/ACT inhaler Inhale 1-2 puffs into the lungs every 6 (six) hours as needed for wheezing.  Marland Kitchen. aspirin 81 MG tablet Take 81 mg by mouth daily.  . hydrochlorothiazide (HYDRODIURIL) 25 MG tablet Take 25 mg by mouth daily.  . hydroxychloroquine (PLAQUENIL) 200 MG tablet Take 200 mg po BID Monday thru Friday  . NIFEdipine (PROCARDIA-XL/ADALAT-CC/NIFEDICAL-XL) 30 MG 24 hr tablet Take 30 mg by mouth daily.  . Vitamin D, Ergocalciferol, (DRISDOL) 50000 units CAPS capsule Take 1 capsule (50,000 Units total) by mouth every 7 (seven) days.       Review of Systems     Objective:   Physical Exam amb pleasant bf nad  Wt Readings from Last 3 Encounters:  08/25/17 190 lb (86.2 kg)  07/27/17 197 lb (89.4 kg)  02/22/17 168 lb (76.2 kg)     Vital signs reviewed - Note on arrival 02 sats  98% on RA      HEENT: nl dentition, turbinates bilaterally, and oropharynx. Nl external ear canals without cough reflex   NECK :  without JVD/Nodes/TM/ nl carotid upstrokes bilaterally   LUNGS: no acc muscle use,  Nl contour chest which is clear to A and P bilaterally without cough on insp or exp maneuvers   CV:  RRR  no s3 owith II/VI SEM  or increase in P2, and no edema   ABD:  soft and nontender with nl inspiratory excursion  in the supine position. No bruits or organomegaly appreciated, bowel sounds nl  MS:  Nl gait/ ext warm without deformities, calf tenderness, cyanosis or clubbing No obvious joint restrictions   SKIN: warm and dry without lesions    NEURO:  alert, approp, nl sensorium with  no motor or cerebellar deficits apparent.     CXR PA and Lateral:   08/25/2017 :    I personally reviewed images and agree with radiology impression as follows:   1. Hyperinflation with mild diffuse bronchitic change. No focal pulmonary opacity 2. Mild cardiomegaly      Assessment:

## 2017-08-29 ENCOUNTER — Encounter: Payer: Self-pay | Admitting: Internal Medicine

## 2017-08-29 NOTE — Assessment & Plan Note (Signed)
Spirometry 08/25/2017  wnl with min curvature p symbicort but poor hfa   DDX of  difficult airways management almost all start with A and  include Adherence, Ace Inhibitors, Acid Reflux, Active Sinus Disease, Alpha 1 Antitripsin deficiency, Anxiety masquerading as Airways dz,  ABPA,  Allergy(esp in young), Aspiration (esp in elderly), Adverse effects of meds,  Active smokers, A bunch of PE's (a small clot burden can't cause this syndrome unless there is already severe underlying pulm or vascular dz with poor reserve) plus two Bs  = Bronchiectasis and Beta blocker use..and one C= CHF   Adherence is always the initial "prime suspect" and is a multilayered concern that requires a "trust but verify" approach in every patient - starting with knowing how to use medications, especially inhalers, correctly, keeping up with refills and understanding the fundamental difference between maintenance and prns vs those medications only taken for a very short course and then stopped and not refilled.  - see hfa technique - return with all meds in hand using a trust but verify approach to confirm accurate Medication  Reconciliation The principal here is that until we are certain that the  patients are doing what we've asked, it makes no sense to ask them to do more.    ? Acid (or non-acid) GERD > always difficult to exclude as up to 75% of pts in some series report no assoc GI/ Heartburn symptoms> rec max (24h)  acid suppression and diet restrictions/ reviewed and instructions given in writing.  - Of the three most common causes of  Sub-acute or recurrent or chronic cough, only one (GERD)  can actually contribute to/ trigger  the other two (asthma and post nasal drip syndrome)  and perpetuate the cylce of cough. While not intuitively obvious, many patients with chronic low grade reflux do not cough until there is a primary insult that disturbs the protective epithelial barrier and exposes sensitive nerve endings.   This  is typically viral but can be direct physical injury such as with an endotracheal tube.   The point is that once this occurs, it is difficult to eliminate the cycle  using anything but a maximally effective acid suppression regimen at least in the short run, accompanied by an appropriate diet to address non acid GERD   ? Allergy > defer allergy screen for now  ? Active sinus ct > consider this if continues to flare   ? Adverse effects of meds > none listed, will do full med rec next ov   ? BB > not on them   ? Bronchiectasis > consider HRCT chest looking for this and ILD related to SLE if symptoms remain difficult to control   Total time devoted to counseling  > 50 % of initial 60 min office visit:  review case with pt/ discussion of options/alternatives/ personally creating written customized instructions  in presence of pt  then going over those specific  Instructions directly with the pt including how to use all of the meds but in particular covering each new medication in detail and the difference between the maintenance= "automatic" meds and the prns using an action plan format for the latter (If this problem/symptom => do that organization reading Left to right).  Please see AVS from this visit for a full list of these instructions which I personally wrote for this pt and  are unique to this visit.

## 2017-09-26 ENCOUNTER — Ambulatory Visit: Payer: Medicare Other | Admitting: Internal Medicine

## 2017-10-16 ENCOUNTER — Other Ambulatory Visit: Payer: Self-pay | Admitting: Rheumatology

## 2017-10-26 ENCOUNTER — Other Ambulatory Visit: Payer: Self-pay

## 2017-10-26 DIAGNOSIS — E559 Vitamin D deficiency, unspecified: Secondary | ICD-10-CM

## 2017-10-27 LAB — VITAMIN D 25 HYDROXY (VIT D DEFICIENCY, FRACTURES): VIT D 25 HYDROXY: 41 ng/mL (ref 30–100)

## 2017-10-27 NOTE — Progress Notes (Signed)
Vit D is normal now. Continue Vit D at 2000U qd . Will check level again in 6 months

## 2017-10-28 ENCOUNTER — Telehealth: Payer: Self-pay | Admitting: *Deleted

## 2017-10-28 DIAGNOSIS — Z8639 Personal history of other endocrine, nutritional and metabolic disease: Secondary | ICD-10-CM

## 2017-10-28 NOTE — Telephone Encounter (Signed)
-----   Message from Pollyann SavoyShaili Deveshwar, MD sent at 10/27/2017  8:25 AM EST ----- Vit D is normal now. Continue Vit D at 2000U qd . Will check level again in 6 months

## 2017-11-14 ENCOUNTER — Other Ambulatory Visit: Payer: Self-pay | Admitting: Internal Medicine

## 2017-11-14 DIAGNOSIS — Z1231 Encounter for screening mammogram for malignant neoplasm of breast: Secondary | ICD-10-CM

## 2017-12-15 NOTE — Progress Notes (Signed)
Office Visit Note  Patient: Jessica Booker             Date of Birth: [birth year]           MRN: [id number]             PCP: Dorothyann Peng, MD Referring: Dorothyann Peng, MD Visit Date: 12/29/2017 Occupation: @GUAROCC @    Subjective:  Right ankle pain   History of Present Illness: Jessica Booker is a 77 y.o. female with history of systemic lupus erythematosus, osteoarthritis, and DDD.  She takes PLQ po BID Monday-Friday.  She denies any recent lupus flares.  She denies any sores in her mouth or nose.  She has developed scalp itching and sores.  She denies any recent rashes.  She denies any swollen lymph nodes.  She continues to have symptoms of Raynolds in her bilateral hands.  She has been experiencing dry mouth but no eye dryness.  She has bilateral trigger little fingers.  She states that her lower back is doing good.  She has been experiencing pain and swelling in her right ankle.  She has some instability in her right ankle.  No pain or swelling in left ankle.  No other joint pain or joint swelling.     Activities of Daily Living:  Patient reports morning stiffness for 5 minutes.   Patient Denies nocturnal pain.  Difficulty dressing/grooming: Denies Difficulty climbing stairs: Denies Difficulty getting out of chair: Denies Difficulty using hands for taps, buttons, cutlery, and/or writing: Denies   Review of Systems  Constitutional: Negative for fatigue.  HENT: Positive for mouth dryness. Negative for mouth sores and nose dryness.   Eyes: Negative for pain, visual disturbance and dryness.  Respiratory: Negative for cough, hemoptysis, shortness of breath and difficulty breathing.   Cardiovascular: Negative for chest pain, palpitations, hypertension and swelling in legs/feet.  Gastrointestinal: Negative for blood in stool, constipation and diarrhea.  Endocrine: Negative for increased urination.  Genitourinary: Negative for painful urination.  Musculoskeletal: Positive for  arthralgias, joint pain, joint swelling and morning stiffness. Negative for myalgias, muscle weakness, muscle tenderness and myalgias.  Skin: Positive for color change and hair loss. Negative for pallor, rash, nodules/bumps, skin tightness, ulcers and sensitivity to sunlight.  Allergic/Immunologic: Negative for susceptible to infections.  Neurological: Negative for dizziness, numbness, headaches and weakness.  Hematological: Negative for swollen glands.  Psychiatric/Behavioral: Negative for depressed mood and sleep disturbance. The patient is not nervous/anxious.     PMFS History:  Patient Active Problem List   Diagnosis Date Noted  . Cough variant asthma 08/25/2017  . Systemic lupus erythematosus (HCC) 02/18/2017  . High risk medication use 02/18/2017  . Raynaud's disease without gangrene 02/18/2017  . DDD (degenerative disc disease), lumbar 02/18/2017  . Primary osteoarthritis of both hands 02/18/2017  . Primary osteoarthritis of both feet 02/18/2017  . Vitamin D deficiency 02/18/2017  . History of hypertension 02/18/2017  . History of neuropathy 02/18/2017    Past Medical History:  Diagnosis Date  . Hypertension   . Lupus (HCC)     Family History  Problem Relation Age of Onset  . Heart attack Mother   . Stomach cancer Father   . Hypertension Sister   . Heart disease Sister    Past Surgical History:  Procedure Laterality Date  . ABDOMINAL HYSTERECTOMY    . CHOLECYSTECTOMY    . SHOULDER ARTHROSCOPY W/ ROTATOR CUFF REPAIR     R shoulder  . TONSILLECTOMY     Social History  Social History Narrative  . Not on file     Objective: Vital Signs: BP (!) 158/70 (BP Location: Left Arm, Patient Position: Sitting, Cuff Size: Normal)   Pulse 66   Resp 14   Ht 5' 4.5" (1.638 m)   Wt 193 lb (87.5 kg)   BMI 32.62 kg/m    Physical Exam  Constitutional: She is oriented to person, place, and time. She appears well-developed and well-nourished.  HENT:  Head: Normocephalic  and atraumatic.  Eyes: Conjunctivae and EOM are normal.  Neck: Normal range of motion.  Cardiovascular: Normal rate, regular rhythm, normal heart sounds and intact distal pulses.  Pulmonary/Chest: Effort normal and breath sounds normal.  Abdominal: Soft. Bowel sounds are normal.  Lymphadenopathy:    She has no cervical adenopathy.  Neurological: She is alert and oriented to person, place, and time.  Skin: Skin is warm and dry. Capillary refill takes less than 2 seconds.  Dry, flaking patches of skin on scalp  Psychiatric: She has a normal mood and affect. Her behavior is normal.  Nursing note and vitals reviewed.    Musculoskeletal Exam: C-spine limited range of motion with lateral rotation.  Thoracic and lumbar spine good ROM.  No midline spinal tenderness.  No SI joint tenderness.  Shoulder joints, elbow joints, wrist joints, MCPs, PIPs, and DIPs good ROM with no synovitis.  PIP and DIP synovial thickening consistent with osteoarthritis.  Hip joints, knee joints, ankle joints, MTPs, PIPs, and DIPs good ROM with no synovitis.  No warmth or effusion of knee joints.  She has warmth and swelling of her right ankle joint.  No tenderness of trochanteric bursa.  CDAI Exam: No CDAI exam completed.    Investigation: No additional findings.PLQ eye exam: 10/13/2017 CBC Latest Ref Rng & Units 07/25/2017 03/24/2017 02/22/2017  WBC 3.8 - 10.8 Thousand/uL 9.0 8.8 8.4  Hemoglobin 11.7 - 15.5 g/dL 95.6 21.3 11.5(L)  Hematocrit 35.0 - 45.0 % 36.2 36.6 35.6  Platelets 140 - 400 Thousand/uL 310 327 288   CMP Latest Ref Rng & Units 07/25/2017 03/24/2017 02/22/2017  Glucose 65 - 99 mg/dL 76 88 86  BUN 7 - 25 mg/dL 14 17 20   Creatinine 0.60 - 0.93 mg/dL 0.86 5.78(I) 6.96  Sodium 135 - 146 mmol/L 139 139 138  Potassium 3.5 - 5.3 mmol/L 4.1 4.5 3.5  Chloride 98 - 110 mmol/L 102 102 103  CO2 20 - 32 mmol/L 33(H) 27 23  Calcium 8.6 - 10.4 mg/dL 9.2 9.2 9.0  Total Protein 6.1 - 8.1 g/dL 7.7 7.4 7.1  Total  Bilirubin 0.2 - 1.2 mg/dL 0.4 0.3 0.2  Alkaline Phos 33 - 130 U/L - 77 76  AST 10 - 35 U/L 17 15 18   ALT 6 - 29 U/L 12 12 13     Imaging: Xr Ankle Complete Right  Result Date: 12/29/2017 No tibiotalar joint space narrowing was noted.  Subtalar joint space narrowing was noted.  No erosive changes were noted.   Speciality Comments: PLQ Eye Exam: 10/13/17 WNL @ [facility] Follow up in 1 year.     Procedures:  No procedures performed Allergies: Patient has no known allergies.   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  She continues to take Plaquenil 200 mg twice daily Monday through Friday.  She has no oral ulcers or nasal ulcers.  No cervical lymphadenopathy noted.  No malar rash noted. She is having right ankle pain and swelling.  We obtained an x-ray that revealed subtalar joint space narrowing.  A referral was made to PT and she was given a prescription for a right ankle brace. She has no synovitis on exam. Autoimmune labs were checked today.- Plan: COMPLETE METABOLIC PANEL WITH GFR, CBC with Differential/Platelet, Urinalysis, Routine w reflex microscopic, ANA, Anti-DNA antibody, double-stranded, C3 and C4, Sedimentation rate  High risk medication use - PLQ po bid Monday-Friday.eye exam: 10/13/2017.  CBC and CMP are done today to monitor for drug toxicity.- Plan: COMPLETE METABOLIC PANEL WITH GFR, CBC with Differential/Platelet  Raynaud's disease without gangrene: She continues to have symptoms of Raynaud's in her hands.  She has no signs of digital ulcerations or gangrene.  She was advised to keep her core body temperature warm and wear gloves on a regular basis.  Pruritus of scalp: She has dry scaly patches on her scalp. We recommended trying T gel and coconut oil.  She was advised to stop using alcohol swabs on her scalp.   A referral was made for her to be evaluated by a dermatologist.    Primary osteoarthritis of both hands: She has PIP and DIP synovial thickening consistent with osteoarthritis of bilateral hands.  She has no synovitis on exam.  Joint protection muscle strengthening were discussed.  Handout of hand exercises was provided to the patient.  Primary osteoarthritis of both feet -chronic pain due to to hammertoes.  She was proper fitting shoes.  DDD (degenerative disc disease), lumbar: No midline spinal tenderness.  Good range of motion.  Discomfort at this time.  Pain in right ankle and joints of right foot -She has subtalar joint space narrowing.  She has instability of her right ankle which is been causing increased pain and swelling.  She was given a prescription for a right ankle brace.  A referral was made to physical therapy for evaluation of right ankle instability.  Plan: XR Ankle Complete Right  Trigger little finger, unspecified laterality: She continues to have symptoms of locking.  She does not want any treatment at this time.   History of neuropathy - She has history of neuropathy. She has paresthesias in her upper extremities and lower extremities  History of hypertension  History of vitamin D deficiency: She is taking vitamin D 2000 units daily.  Most recent vitamin D was 41 on 10/26/2017.     Orders: Orders Placed This Encounter  Procedures  . XR Ankle Complete Right  . COMPLETE METABOLIC PANEL WITH GFR  . CBC with Differential/Platelet  . Urinalysis, Routine w reflex microscopic  . ANA  . Anti-DNA antibody, double-stranded  . C3 and C4  . Sedimentation rate   No orders of the defined types were placed in this encounter.   Face-to-face time spent with patient was 30 minutes. >50% of time was spent in counseling and coordination of care.  Follow-Up Instructions: Return in about 5 months (around 05/31/2018) for Systemic lupus erythematosus, Osteoarthritis.   Gearldine Bienenstockaylor M  Andrei Mccook, PA-C  I examined and evaluated the patient with Sherron Alesaylor Grae Leathers PA. The plan of care was discussed as noted above.  Pollyann SavoyShaili Deveshwar, MD Note - This record has been created using Animal nutritionistDragon software.  Chart creation errors have been sought, but may not always  have been located. Such creation errors do not reflect on  the standard of medical care.

## 2017-12-27 ENCOUNTER — Ambulatory Visit: Payer: Medicare Other

## 2017-12-29 ENCOUNTER — Ambulatory Visit: Payer: Medicare Other | Admitting: Rheumatology

## 2017-12-29 ENCOUNTER — Telehealth: Payer: Self-pay | Admitting: Rheumatology

## 2017-12-29 ENCOUNTER — Ambulatory Visit (INDEPENDENT_AMBULATORY_CARE_PROVIDER_SITE_OTHER): Payer: Self-pay

## 2017-12-29 ENCOUNTER — Encounter: Payer: Self-pay | Admitting: Rheumatology

## 2017-12-29 VITALS — BP 158/70 | HR 66 | Resp 14 | Ht 64.5 in | Wt 193.0 lb

## 2017-12-29 DIAGNOSIS — Z8669 Personal history of other diseases of the nervous system and sense organs: Secondary | ICD-10-CM

## 2017-12-29 DIAGNOSIS — M19072 Primary osteoarthritis, left ankle and foot: Secondary | ICD-10-CM

## 2017-12-29 DIAGNOSIS — M19071 Primary osteoarthritis, right ankle and foot: Secondary | ICD-10-CM

## 2017-12-29 DIAGNOSIS — Z8639 Personal history of other endocrine, nutritional and metabolic disease: Secondary | ICD-10-CM

## 2017-12-29 DIAGNOSIS — M3219 Other organ or system involvement in systemic lupus erythematosus: Secondary | ICD-10-CM

## 2017-12-29 DIAGNOSIS — Z79899 Other long term (current) drug therapy: Secondary | ICD-10-CM | POA: Diagnosis not present

## 2017-12-29 DIAGNOSIS — I7300 Raynaud's syndrome without gangrene: Secondary | ICD-10-CM | POA: Diagnosis not present

## 2017-12-29 DIAGNOSIS — M19041 Primary osteoarthritis, right hand: Secondary | ICD-10-CM | POA: Diagnosis not present

## 2017-12-29 DIAGNOSIS — M65359 Trigger finger, unspecified little finger: Secondary | ICD-10-CM

## 2017-12-29 DIAGNOSIS — L299 Pruritus, unspecified: Secondary | ICD-10-CM | POA: Diagnosis not present

## 2017-12-29 DIAGNOSIS — M5136 Other intervertebral disc degeneration, lumbar region: Secondary | ICD-10-CM

## 2017-12-29 DIAGNOSIS — M51369 Other intervertebral disc degeneration, lumbar region without mention of lumbar back pain or lower extremity pain: Secondary | ICD-10-CM

## 2017-12-29 DIAGNOSIS — Z8679 Personal history of other diseases of the circulatory system: Secondary | ICD-10-CM

## 2017-12-29 DIAGNOSIS — M25571 Pain in right ankle and joints of right foot: Secondary | ICD-10-CM

## 2017-12-29 DIAGNOSIS — M19042 Primary osteoarthritis, left hand: Secondary | ICD-10-CM

## 2017-12-29 LAB — COMPLETE METABOLIC PANEL WITH GFR
AG RATIO: 1.2 (calc) (ref 1.0–2.5)
ALT: 12 U/L (ref 6–29)
AST: 14 U/L (ref 10–35)
Albumin: 4.2 g/dL (ref 3.6–5.1)
Alkaline phosphatase (APISO): 90 U/L (ref 33–130)
BUN: 20 mg/dL (ref 7–25)
CALCIUM: 9.7 mg/dL (ref 8.6–10.4)
CO2: 32 mmol/L (ref 20–32)
CREATININE: 0.85 mg/dL (ref 0.60–0.93)
Chloride: 100 mmol/L (ref 98–110)
GFR, EST AFRICAN AMERICAN: 77 mL/min/{1.73_m2} (ref >=60)
GFR, EST NON AFRICAN AMERICAN: 67 mL/min/{1.73_m2} (ref >=60)
GLOBULIN: 3.6 g/dL (ref 1.9–3.7)
Glucose, Bld: 85 mg/dL (ref 65–99)
POTASSIUM: 3.7 mmol/L (ref 3.5–5.3)
Sodium: 139 mmol/L (ref 135–146)
TOTAL PROTEIN: 7.8 g/dL (ref 6.1–8.1)
Total Bilirubin: 0.4 mg/dL (ref 0.2–1.2)

## 2017-12-29 LAB — CBC WITH DIFFERENTIAL/PLATELET
BASOS ABS: 40 {cells}/uL (ref 0–200)
BASOS PCT: 0.5 %
EOS ABS: 312 {cells}/uL (ref 15–500)
Eosinophils Relative: 3.9 %
HCT: 35.3 % (ref 35.0–45.0)
Hemoglobin: 11.8 g/dL (ref 11.7–15.5)
Lymphs Abs: 2552 cells/uL (ref 850–3900)
MCH: 26.2 pg — AB (ref 27.0–33.0)
MCHC: 33.4 g/dL (ref 32.0–36.0)
MCV: 78.3 fL — AB (ref 80.0–100.0)
MONOS PCT: 6.9 %
MPV: 10.5 fL (ref 7.5–12.5)
Neutro Abs: 4544 cells/uL (ref 1500–7800)
Neutrophils Relative %: 56.8 %
Platelets: 276 10*3/uL (ref 140–400)
RBC: 4.51 10*6/uL (ref 3.80–5.10)
RDW: 13.4 % (ref 11.0–15.0)
Total Lymphocyte: 31.9 %
WBC: 8 10*3/uL (ref 3.8–10.8)
WBCMIX: 552 {cells}/uL (ref 200–950)

## 2017-12-29 LAB — SEDIMENTATION RATE: Sed Rate: 38 mm/h — ABNORMAL HIGH (ref 0–30)

## 2017-12-29 NOTE — Telephone Encounter (Signed)
Patient called stating that Dr. Corliss Skains recommended a shampoo for her to use and she can't remember the name

## 2017-12-29 NOTE — Patient Instructions (Addendum)
Magnesium Malate 250 mg by mouth at bedtime Can cause drowsiness and diarrhea If you can tolerate the 250 mg dose, you can increase to 500 mg po at bedtime.  TGel shampoo for irritation of scalp.  A referral was made to a dermatologist.   You can also try using coconut oil on your scalp.    Hand Exercises Hand exercises can be helpful to almost anyone. These exercises can strengthen the hands, improve flexibility and movement, and increase blood flow to the hands. These results can make work and daily tasks easier. Hand exercises can be especially helpful for people who have joint pain from arthritis or have nerve damage from overuse (carpal tunnel syndrome). These exercises can also help people who have injured a hand. Most of these hand exercises are fairly gentle stretching routines. You can do them often throughout the day. Still, it is a good idea to ask your health care provider which exercises would be best for you. Warming your hands before exercise may help to reduce stiffness. You can do this with gentle massage or by placing your hands in warm water for 15 minutes. Also, make sure you pay attention to your level of hand pain as you begin an exercise routine. Exercises Knuckle Bend Repeat this exercise 5-10 times with each hand. 1. Stand or sit with your arm, hand, and all five fingers pointed straight up. Make sure your wrist is straight. 2. Gently and slowly bend your fingers down and inward until the tips of your fingers are touching the tops of your palm. 3. Hold this position for a few seconds. 4. Extend your fingers out to their original position, all pointing straight up again.  Finger Fan Repeat this exercise 5-10 times with each hand. 1. Hold your arm and hand out in front of you. Keep your wrist straight. 2. Squeeze your hand into a fist. 3. Hold this position for a few seconds. 4. Loel Dubonnet out, or spread apart, your hand and fingers as much as possible, stretching every joint  fully.  Tabletop Repeat this exercise 5-10 times with each hand. 1. Stand or sit with your arm, hand, and all five fingers pointed straight up. Make sure your wrist is straight. 2. Gently and slowly bend your fingers at the knuckles where they meet the hand until your hand is making an upside-down L shape. Your fingers should form a tabletop. 3. Hold this position for a few seconds. 4. Extend your fingers out to their original position, all pointing straight up again.  Making Os Repeat this exercise 5-10 times with each hand. 1. Stand or sit with your arm, hand, and all five fingers pointed straight up. Make sure your wrist is straight. 2. Make an O shape by touching your pointer finger to your thumb. Hold for a few seconds. Then open your hand wide. 3. Repeat this motion with each finger on your hand.  Table Spread Repeat this exercise 5-10 times with each hand. 1. Place your hand on a table with your palm facing down. Make sure your wrist is straight. 2. Spread your fingers out as much as possible. Hold this position for a few seconds. 3. Slide your fingers back together again. Hold for a few seconds.  Ball Grip  Repeat this exercise 10-15 times with each hand. 1. Hold a tennis ball or another soft ball in your hand. 2. While slowly increasing pressure, squeeze the ball as hard as possible. 3. Squeeze as hard as you can for 3-5  seconds. 4. Relax and repeat.  Wrist Curls Repeat this exercise 10-15 times with each hand. 1. Sit in a chair that has armrests. 2. Hold a light weight in your hand, such as a dumbbell that weighs 1-3 pounds (0.5-1.4 kg). Ask your health care provider what weight would be best for you. 3. Rest your hand just over the end of the chair arm with your palm facing up. 4. Gently pivot your wrist up and down while holding the weight. Do not twist your wrist from side to side.  Contact a health care provider if:  Your hand pain or discomfort gets much worse  when you do an exercise.  Your hand pain or discomfort does not improve within 2 hours after you exercise. If you have any of these problems, stop doing these exercises right away. Do not do them again unless your health care provider says that you can. Get help right away if:  You develop sudden, severe hand pain. If this happens, stop doing these exercises right away. Do not do them again unless your health care provider says that you can. This information is not intended to replace advice given to you by your health care provider. Make sure you discuss any questions you have with your health care provider. Document Released: 07/28/2015 Document Revised: 01/22/2016 Document Reviewed: 02/24/2015 Elsevier Interactive Patient Education  [employer].

## 2017-12-30 LAB — URINALYSIS, ROUTINE W REFLEX MICROSCOPIC
BILIRUBIN URINE: NEGATIVE
Bacteria, UA: NONE SEEN /HPF
GLUCOSE, UA: NEGATIVE
Hgb urine dipstick: NEGATIVE
Hyaline Cast: NONE SEEN /LPF
KETONES UR: NEGATIVE
NITRITE: NEGATIVE
PH: 6.5 (ref 5.0–8.0)
Protein, ur: NEGATIVE
RBC / HPF: NONE SEEN /HPF (ref 0–2)
SPECIFIC GRAVITY, URINE: 1.01 (ref 1.001–1.03)
WBC, UA: NONE SEEN /HPF (ref 0–5)

## 2017-12-30 LAB — ANTI-DNA ANTIBODY, DOUBLE-STRANDED: DS DNA AB: 3 [IU]/mL

## 2017-12-30 LAB — C3 AND C4
C3 Complement: 157 mg/dL (ref 83–193)
C4 Complement: 31 mg/dL (ref 15–57)

## 2018-01-02 LAB — ANA: Anti Nuclear Antibody(ANA): NEGATIVE

## 2018-01-02 NOTE — Telephone Encounter (Signed)
T gel and coconut oil. Patient advised.

## 2018-01-02 NOTE — Progress Notes (Signed)
Autoimmune labs are WNL. Sed rate mildly elevated.   Trace leukocytes in urine.  Please ask patient if she has any symptoms of a UTI.

## 2018-01-04 ENCOUNTER — Telehealth: Payer: Self-pay | Admitting: Rheumatology

## 2018-01-04 NOTE — Telephone Encounter (Signed)
Reviewed labs with patient. °

## 2018-01-04 NOTE — Telephone Encounter (Signed)
Patient called stating she was returning your call regarding her lab results. °

## 2018-02-21 ENCOUNTER — Ambulatory Visit
Admission: RE | Admit: 2018-02-21 | Discharge: 2018-02-21 | Disposition: A | Discharge status: Home / Self Care | Payer: Medicare Other | Source: Ambulatory Visit | Attending: Internal Medicine | Admitting: Internal Medicine

## 2018-02-21 DIAGNOSIS — Z1231 Encounter for screening mammogram for malignant neoplasm of breast: Secondary | ICD-10-CM

## 2018-05-19 NOTE — Progress Notes (Deleted)
Office Visit Note  Patient: Jessica Booker Melena             Date of Birth: 07/25/1941           MRN: [id number]             PCP: Dorothyann PengSanders, Robyn, MD Referring: Dorothyann PengSanders, Robyn, MD Visit Date: 06/01/2018 Occupation: @GUAROCC @  Subjective:  No chief complaint on file.   History of Present Illness: Jessica Booker Hobby is a 77 y.o. female ***   Activities of Daily Living:  Patient reports morning stiffness for *** {minute/hour:19697}.   Patient {ACTIONS;DENIES/REPORTS:21021675::"Denies"} nocturnal pain.  Difficulty dressing/grooming: {ACTIONS;DENIES/REPORTS:21021675::"Denies"} Difficulty climbing stairs: {ACTIONS;DENIES/REPORTS:21021675::"Denies"} Difficulty getting out of chair: {ACTIONS;DENIES/REPORTS:21021675::"Denies"} Difficulty using hands for taps, buttons, cutlery, and/or writing: {ACTIONS;DENIES/REPORTS:21021675::"Denies"}  No Rheumatology ROS completed.   PMFS History:  Patient Active Problem List   Diagnosis Date Noted  . Cough variant asthma 08/25/2017  . Systemic lupus erythematosus (HCC) 02/18/2017  . High risk medication use 02/18/2017  . Raynaud's disease without gangrene 02/18/2017  . DDD (degenerative disc disease), lumbar 02/18/2017  . Primary osteoarthritis of both hands 02/18/2017  . Primary osteoarthritis of both feet 02/18/2017  . Vitamin Booker deficiency 02/18/2017  . History of hypertension 02/18/2017  . History of neuropathy 02/18/2017    Past Medical History:  Diagnosis Date  . Hypertension   . Lupus (HCC)     Family History  Problem Relation Age of Onset  . Heart attack Mother   . Stomach cancer Father   . Hypertension Sister   . Heart disease Sister    Past Surgical History:  Procedure Laterality Date  . ABDOMINAL HYSTERECTOMY    . CHOLECYSTECTOMY    . SHOULDER ARTHROSCOPY W/ ROTATOR CUFF REPAIR     R shoulder  . TONSILLECTOMY     Social History   Social History Narrative  . Not on file    Objective: Vital Signs: There were no vitals  taken for this visit.   Physical Exam   Musculoskeletal Exam: ***  CDAI Exam: CDAI Score: Not documented Patient Global Assessment: Not documented; Provider Global Assessment: Not documented Swollen: Not documented; Tender: Not documented Joint Exam   Not documented   There is currently no information documented on the homunculus. Go to the Rheumatology activity and complete the homunculus joint exam.  Investigation: No additional findings.  Imaging: No results found.  Recent Labs: Lab Results  Component Value Date   WBC 8.0 12/29/2017   HGB 11.8 12/29/2017   PLT 276 12/29/2017   NA 139 12/29/2017   K 3.7 12/29/2017   CL 100 12/29/2017   CO2 32 12/29/2017   GLUCOSE 85 12/29/2017   BUN 20 12/29/2017   CREATININE 0.85 12/29/2017   BILITOT 0.4 12/29/2017   ALKPHOS 77 03/24/2017   AST 14 12/29/2017   ALT 12 12/29/2017   PROT 7.8 12/29/2017   ALBUMIN 4.0 03/24/2017   CALCIUM 9.7 12/29/2017   GFRAA 77 12/29/2017    Speciality Comments: PLQ Eye Exam: 10/13/17 WNL @ [facility] Follow up in 1 year.   Procedures:  No procedures performed Allergies: Patient has no known allergies.   Assessment / Plan:     Visit Diagnoses: No diagnosis found.   Orders: No orders of the defined types were placed in this encounter.  No orders of the defined types were placed in this encounter.   Face-to-face time spent with patient was *** minutes. Greater than 50% of time was spent in counseling and coordination of care.  Follow-Up Instructions: No follow-ups on file.   Earnestine Mealing, CMA  Note - This record has been created using Editor, commissioning.  Chart creation errors have been sought, but may not always  have been located. Such creation errors do not reflect on  the standard of medical care.

## 2018-05-25 ENCOUNTER — Other Ambulatory Visit: Payer: Self-pay

## 2018-05-25 DIAGNOSIS — Z8639 Personal history of other endocrine, nutritional and metabolic disease: Secondary | ICD-10-CM

## 2018-05-25 NOTE — Progress Notes (Signed)
Office Visit Note  Patient: Jessica Booker             Date of Birth: 10-06-1940           MRN: [id number]             PCP: Dorothyann Peng, MD Referring: Dorothyann Peng, MD Visit Date: 05/29/2018 Occupation: @GUAROCC @  Subjective:  Left knee pain   History of Present Illness: Jessica Booker is a 77 y.o. female with history of systemic lupus erythematosus and osteoarthritis.  She is on PLQ 200 mg 1 tablet BID M-F.  She reports that she has been having increased pain in multiple joints including bilateral hands, bilateral wrists, bilateral ankle joints, and left knee joint.  She states that she has had a sensation of her left knee giving out tenderness noticed increased swelling.  She denies any injuries.  She states that she has discomfort with extension of her left knee joint.  She denies any recent lupus flares.  Denies any sores in her mouth or nose.  Denies any recent rashes.  She denies any fevers or worsening fatigue.  She denies any swollen lymph nodes.  She has not had any shortness of breath or palpitations recently.  She continues to have symptoms of Raynaud's in her fingers.  She denies any sicca symptoms at this time.  She states it plans on getting her flu vaccine today.   Activities of Daily Living:  Patient reports morning stiffness for 2  minutes.   Patient Reports nocturnal pain.  Difficulty dressing/grooming: Reports Difficulty climbing stairs: Denies Difficulty getting out of chair: Denies Difficulty using hands for taps, buttons, cutlery, and/or writing: Denies  Review of Systems  Constitutional: Negative for fatigue.  HENT: Negative for mouth sores, mouth dryness and nose dryness.   Eyes: Negative for pain, visual disturbance and dryness.  Respiratory: Negative for cough, hemoptysis, shortness of breath and difficulty breathing.   Cardiovascular: Negative for chest pain, palpitations, hypertension and swelling in legs/feet.  Gastrointestinal: Negative for  blood in stool, constipation and diarrhea.  Endocrine: Negative for increased urination.  Genitourinary: Negative for painful urination.  Musculoskeletal: Positive for arthralgias, joint pain, myalgias, morning stiffness and myalgias. Negative for joint swelling, muscle weakness and muscle tenderness.  Skin: Negative for color change, pallor, rash, hair loss, nodules/bumps, skin tightness, ulcers and sensitivity to sunlight.  Allergic/Immunologic: Negative for susceptible to infections.  Neurological: Negative for dizziness, numbness, headaches and weakness.  Hematological: Negative for swollen glands.  Psychiatric/Behavioral: Negative for depressed mood and sleep disturbance. The patient is not nervous/anxious.     PMFS History:  Patient Active Problem List   Diagnosis Date Noted  . Cough variant asthma 08/25/2017  . Systemic lupus erythematosus (HCC) 02/18/2017  . High risk medication use 02/18/2017  . Raynaud's disease without gangrene 02/18/2017  . DDD (degenerative disc disease), lumbar 02/18/2017  . Primary osteoarthritis of both hands 02/18/2017  . Primary osteoarthritis of both feet 02/18/2017  . Vitamin D deficiency 02/18/2017  . History of hypertension 02/18/2017  . History of neuropathy 02/18/2017    Past Medical History:  Diagnosis Date  . Hypertension   . Lupus (HCC)     Family History  Problem Relation Age of Onset  . Heart attack Mother   . Stomach cancer Father   . Hypertension Sister   . Heart disease Sister    Past Surgical History:  Procedure Laterality Date  . ABDOMINAL HYSTERECTOMY    . CHOLECYSTECTOMY    .  SHOULDER ARTHROSCOPY W/ ROTATOR CUFF REPAIR     R shoulder  . TONSILLECTOMY     Social History   Social History Narrative  . Not on file    Objective: Vital Signs: BP 137/64 (BP Location: Right Arm, Patient Position: Sitting, Cuff Size: Normal)   Pulse 73   Resp 15   Ht 5' 4.5" (1.638 m)   Wt 197 lb 6.4 oz (89.5 kg)   BMI 33.36 kg/m      Physical Exam  Constitutional: She is oriented to person, place, and time. She appears well-developed and well-nourished.  HENT:  Head: Normocephalic and atraumatic.  Eyes: Conjunctivae and EOM are normal.  Neck: Normal range of motion.  Cardiovascular: Normal rate, regular rhythm and intact distal pulses.  Murmur heard. Pulmonary/Chest: Effort normal and breath sounds normal.  Abdominal: Soft. Bowel sounds are normal.  Lymphadenopathy:    She has no cervical adenopathy.  Neurological: She is alert and oriented to person, place, and time.  Skin: Skin is warm and dry. Capillary refill takes less than 2 seconds.  Psychiatric: She has a normal mood and affect. Her behavior is normal.  Nursing note and vitals reviewed.    Musculoskeletal Exam: C-spine, thoracic spine, and lumbar spine good ROM.  No midline spinal tenderness. No SI joint tenderness.  Shoulder joints, elbow joints, wrist joints, MCPs, PIPs, DIPs good range of motion no synovitis.  She is complete fist formation bilaterally.  Hip joints good range of motion no discomfort.  No tenderness of trochanter bursa bilaterally.  She has discomfort with left knee extension.  She has mild warmth of bilateral knee joints.  She has pitting edema bilaterally.  CDAI Exam: CDAI Score: Not documented Patient Global Assessment: Not documented; Provider Global Assessment: Not documented Swollen: Not documented; Tender: Not documented Joint Exam   Not documented   There is currently no information documented on the homunculus. Go to the Rheumatology activity and complete the homunculus joint exam.  Investigation: No additional findings.  Imaging: No results found.  Recent Labs: Lab Results  Component Value Date   WBC 8.0 12/29/2017   HGB 11.8 12/29/2017   PLT 276 12/29/2017   NA 139 12/29/2017   K 3.7 12/29/2017   CL 100 12/29/2017   CO2 32 12/29/2017   GLUCOSE 85 12/29/2017   BUN 20 12/29/2017   CREATININE 0.85 12/29/2017    BILITOT 0.4 12/29/2017   ALKPHOS 77 03/24/2017   AST 14 12/29/2017   ALT 12 12/29/2017   PROT 7.8 12/29/2017   ALBUMIN 4.0 03/24/2017   CALCIUM 9.7 12/29/2017   GFRAA 77 12/29/2017    Speciality Comments: PLQ Eye Exam: 10/13/17 WNL @ [facility] Follow up in 1 year.   Procedures:  Large Joint Inj: L knee on 05/29/2018 9:57 AM Indications: pain Details: 27 G 1.5 in needle, medial approach  Arthrogram: No  Medications: 40 mg triamcinolone acetonide 40 MG/ML; 1.5 mL lidocaine 1 % Aspirate: 0 mL Outcome: tolerated well, no immediate complications Procedure, treatment alternatives, risks and benefits explained, specific risks discussed. Consent was given by the patient. Immediately prior to procedure a time out was called to verify the correct patient, procedure, equipment, support staff and site/side marked as required. Patient was prepped and draped in the usual sterile fashion.     Allergies: Patient has no known allergies.   Assessment / Plan:     Visit Diagnoses: Other systemic lupus erythematosus with other organ involvement (HCC) -  ANA, positive Smith, and positive Ro  and RNP: She has not had any recent lupus flares.  She is clinically doing well on Plaquenil 200 mg 1 tablet by mouth twice daily.  She has no synovitis on exam today.  She has been having increased pain in multiple joints including bilateral hands, bilateral ankles, and left knee joint.  She has mild warmth of the left knee joint.  An x-ray of the left knee will be obtained today.  She has not had any recent malar rash or skin lesions.  No oral or nasal ulcerations noted.  She denies sicca symptoms. No parotid swelling noted.  She has not had any shortness of breath or palpitations.  She has not had worsening fatigue or any low-grade fevers.  No cervical lymphadenopathy palpable. She plans on getting the influenza vaccine today.  We will check autoimmune labs today.  A refill of PLQ was sent to the pharmacy.   She was advised to notify us if she develops any new or worsening symptoms.  She will follow up in 5 months.- Plan: COMPLETE METABOLIC PANEL WITH GFR, CBC with Differential/Platelet, Urinalysis, Routine w reflex microscopic, Anti-DNA antibody, double-stranded, C3 and C4, Sedimentation rate  High risk medication use - PLQ. eye exam: 10/13/2017. CBC and CMP were drawn today to monitor for drug toxicity. - Plan: COMPLETE METABOLIC PANEL WITH GFR, CBC with Differential/Platelet  Raynaud's disease without gangrene: She continues to have symptoms of Raynaud's.  No digital ulcerations or signs of gangrene noted.    Primary osteoarthritis of both hands: She has mild osteoarthritic changes of both hands. No synovitis noted. Joint protection and muscle strengthening were discussed.   Primary osteoarthritis of both feet - She has chronic feet pain due to to hammertoes.    Pruritus of scalp: Intermittent. She uses T-gel shampoo which helps.   DDD (degenerative disc disease), lumbar: No midline spinal tenderness.    History of vitamin D deficiency: She takes Vitamin D 50,000 units by mouth once a week.   History of neuropathy - She has history of neuropathy. She has paresthesias in her upper extremities and lower extremities. She does not want to try taking Gabapentin.   Chronic pain of left knee - She has been having increased left knee pain.  She has not had any recent injuries that she is aware of.  She has tenderness along the lateral joint line and discomfort with full extension. Mild warmth was noted. The left knee joint has been giving out on her at times.  X-ray of the left knee was obtained today. Plan: XR KNEE 3 VIEW LEFT.  The x-ray of the knee joint showed moderate osteoarthritis and moderate chondromalacia patella.  I discussed the option of trying cortisone injection.  If she still feels ongoing discomfort and would consider MRI in the future.  History of hypertension:She is taking HCTZ. She was  advised to follow up with PCP regarding increased pitting edema.    Trigger little finger, unspecified laterality: She continues to have intermittent triggering.      Orders: Orders Placed This Encounter  Procedures  . XR KNEE 3 VIEW LEFT  . COMPLETE METABOLIC PANEL WITH GFR  . CBC with Differential/Platelet  . Urinalysis, Routine w reflex microscopic  . Anti-DNA antibody, double-stranded  . C3 and C4  . Sedimentation rate   Meds ordered this encounter  Medications  . hydroxychloroquine (PLAQUENIL) 200 MG tablet    Sig: Take 200 mg po BID Monday thru Friday    Dispense:  120 tablet  Refill:  0    Face-to-face time spent with patient was 30 minutes. Greater than 50% of time was spent in counseling and coordination of care.  Follow-Up Instructions: Return in about 5 months (around 10/28/2018) for Systemic lupus erythematosus, Osteoarthritis.   Gearldine Bienenstock, PA-C I examined and evaluated the patient with Sherron Ales PA. The plan of care was discussed as noted above.  Patient has been having left knee joint pain and discomfort.  X-rays were obtained today.  I discussed the findings of the x-rays with the patient.  After different treatment options were discussed the left knee joint was injected with cortisone as described above.  Patient will contact us in case her symptoms persist.  Pollyann Savoy, MD Note - This record has been created using Animal nutritionist.  Chart creation errors have been sought, but may not always  have been located. Such creation errors do not reflect on  the standard of medical care.

## 2018-05-26 ENCOUNTER — Telehealth: Payer: Self-pay | Admitting: *Deleted

## 2018-05-26 DIAGNOSIS — Z8639 Personal history of other endocrine, nutritional and metabolic disease: Secondary | ICD-10-CM

## 2018-05-26 LAB — VITAMIN D 25 HYDROXY (VIT D DEFICIENCY, FRACTURES): VIT D 25 HYDROXY: 23 ng/mL — AB (ref 30–100)

## 2018-05-26 MED ORDER — VITAMIN D (ERGOCALCIFEROL) 1.25 MG (50000 UNIT) PO CAPS: 50000.0000 [IU] | ORAL_CAPSULE | ORAL | 0 refills | Status: DC

## 2018-05-26 NOTE — Telephone Encounter (Signed)
-----   Message from Gearldine Bienenstock, PA-C sent at 05/26/2018  8:12 AM EDT ----- Vitamin D is low.  Please notify patient and send in Vitamin D 50,000 units by mouth once a week for 3 months.  We will recheck vitamin D in 3 months.

## 2018-05-29 ENCOUNTER — Encounter (INDEPENDENT_AMBULATORY_CARE_PROVIDER_SITE_OTHER): Payer: Self-pay

## 2018-05-29 ENCOUNTER — Ambulatory Visit: Payer: Medicare Other | Admitting: Physician Assistant

## 2018-05-29 ENCOUNTER — Ambulatory Visit (INDEPENDENT_AMBULATORY_CARE_PROVIDER_SITE_OTHER): Payer: Self-pay

## 2018-05-29 ENCOUNTER — Encounter: Payer: Self-pay | Admitting: Physician Assistant

## 2018-05-29 VITALS — BP 137/64 | HR 73 | Resp 15 | Ht 64.5 in | Wt 197.4 lb

## 2018-05-29 DIAGNOSIS — L299 Pruritus, unspecified: Secondary | ICD-10-CM

## 2018-05-29 DIAGNOSIS — M19041 Primary osteoarthritis, right hand: Secondary | ICD-10-CM

## 2018-05-29 DIAGNOSIS — M25562 Pain in left knee: Secondary | ICD-10-CM

## 2018-05-29 DIAGNOSIS — M19042 Primary osteoarthritis, left hand: Secondary | ICD-10-CM

## 2018-05-29 DIAGNOSIS — Z8669 Personal history of other diseases of the nervous system and sense organs: Secondary | ICD-10-CM

## 2018-05-29 DIAGNOSIS — M19072 Primary osteoarthritis, left ankle and foot: Secondary | ICD-10-CM

## 2018-05-29 DIAGNOSIS — I7300 Raynaud's syndrome without gangrene: Secondary | ICD-10-CM | POA: Diagnosis not present

## 2018-05-29 DIAGNOSIS — G8929 Other chronic pain: Secondary | ICD-10-CM

## 2018-05-29 DIAGNOSIS — M51369 Other intervertebral disc degeneration, lumbar region without mention of lumbar back pain or lower extremity pain: Secondary | ICD-10-CM

## 2018-05-29 DIAGNOSIS — M3219 Other organ or system involvement in systemic lupus erythematosus: Secondary | ICD-10-CM

## 2018-05-29 DIAGNOSIS — Z8679 Personal history of other diseases of the circulatory system: Secondary | ICD-10-CM

## 2018-05-29 DIAGNOSIS — M5136 Other intervertebral disc degeneration, lumbar region: Secondary | ICD-10-CM

## 2018-05-29 DIAGNOSIS — Z79899 Other long term (current) drug therapy: Secondary | ICD-10-CM

## 2018-05-29 DIAGNOSIS — M65359 Trigger finger, unspecified little finger: Secondary | ICD-10-CM

## 2018-05-29 DIAGNOSIS — Z8639 Personal history of other endocrine, nutritional and metabolic disease: Secondary | ICD-10-CM

## 2018-05-29 DIAGNOSIS — M19071 Primary osteoarthritis, right ankle and foot: Secondary | ICD-10-CM

## 2018-05-29 MED ORDER — TRIAMCINOLONE ACETONIDE 40 MG/ML IJ SUSP
40.0000 mg | INTRAMUSCULAR | Status: AC | PRN
  Administered 2018-05-29: 40 mg via INTRA_ARTICULAR

## 2018-05-29 MED ORDER — LIDOCAINE HCL 1 % IJ SOLN
1.5000 mL | INTRAMUSCULAR | Status: AC | PRN
  Administered 2018-05-29: 1.5 mL

## 2018-05-29 MED ORDER — HYDROXYCHLOROQUINE SULFATE 200 MG PO TABS
ORAL_TABLET | ORAL | 0 refills | Status: DC
Start: 2018-05-29 — End: 2018-11-17

## 2018-05-30 LAB — CBC WITH DIFFERENTIAL/PLATELET
BASOS ABS: 16 {cells}/uL (ref 0–200)
Basophils Relative: 0.2 %
EOS PCT: 3.2 %
Eosinophils Absolute: 259 cells/uL (ref 15–500)
HCT: 36 % (ref 35.0–45.0)
HEMOGLOBIN: 11.5 g/dL — AB (ref 11.7–15.5)
Lymphs Abs: 1855 cells/uL (ref 850–3900)
MCH: 25.8 pg — ABNORMAL LOW (ref 27.0–33.0)
MCHC: 31.9 g/dL — AB (ref 32.0–36.0)
MCV: 80.7 fL (ref 80.0–100.0)
MONOS PCT: 5.5 %
MPV: 10.3 fL (ref 7.5–12.5)
NEUTROS ABS: 5524 {cells}/uL (ref 1500–7800)
Neutrophils Relative %: 68.2 %
Platelets: 295 10*3/uL (ref 140–400)
RBC: 4.46 10*6/uL (ref 3.80–5.10)
RDW: 13.5 % (ref 11.0–15.0)
Total Lymphocyte: 22.9 %
WBC mixed population: 446 cells/uL (ref 200–950)
WBC: 8.1 10*3/uL (ref 3.8–10.8)

## 2018-05-30 LAB — COMPLETE METABOLIC PANEL WITH GFR
AG RATIO: 1.2 (calc) (ref 1.0–2.5)
ALKALINE PHOSPHATASE (APISO): 88 U/L (ref 33–130)
ALT: 9 U/L (ref 6–29)
AST: 16 U/L (ref 10–35)
Albumin: 4.1 g/dL (ref 3.6–5.1)
BILIRUBIN TOTAL: 0.3 mg/dL (ref 0.2–1.2)
BUN: 17 mg/dL (ref 7–25)
CALCIUM: 9.4 mg/dL (ref 8.6–10.4)
CHLORIDE: 104 mmol/L (ref 98–110)
CO2: 29 mmol/L (ref 20–32)
Creat: 0.87 mg/dL (ref 0.60–0.93)
GFR, EST NON AFRICAN AMERICAN: 64 mL/min/{1.73_m2} (ref >=60)
GFR, Est African American: 74 mL/min/{1.73_m2} (ref >=60)
GLOBULIN: 3.5 g/dL (ref 1.9–3.7)
Glucose, Bld: 98 mg/dL (ref 65–99)
POTASSIUM: 3.7 mmol/L (ref 3.5–5.3)
SODIUM: 140 mmol/L (ref 135–146)
Total Protein: 7.6 g/dL (ref 6.1–8.1)

## 2018-05-30 LAB — C3 AND C4
C3 COMPLEMENT: 149 mg/dL (ref 83–193)
C4 COMPLEMENT: 32 mg/dL (ref 15–57)

## 2018-05-30 LAB — URINALYSIS, ROUTINE W REFLEX MICROSCOPIC
Bilirubin Urine: NEGATIVE
Glucose, UA: NEGATIVE
HGB URINE DIPSTICK: NEGATIVE
KETONES UR: NEGATIVE
LEUKOCYTES UA: NEGATIVE
NITRITE: NEGATIVE
PH: 5.5 (ref 5.0–8.0)
PROTEIN: NEGATIVE
Specific Gravity, Urine: 1.009 (ref 1.001–1.03)

## 2018-05-30 LAB — SEDIMENTATION RATE: Sed Rate: 19 mm/h (ref 0–30)

## 2018-05-30 LAB — ANTI-DNA ANTIBODY, DOUBLE-STRANDED: ds DNA Ab: 3 IU/mL

## 2018-05-30 NOTE — Progress Notes (Signed)
CBC reveals signs of mild anemia.  Please encourage to take a multivitamin with Iron.  Please forward results to PCP.  All other labs are WNL.

## 2018-06-01 ENCOUNTER — Ambulatory Visit: Payer: Medicare Other | Admitting: Rheumatology

## 2018-06-06 ENCOUNTER — Encounter: Payer: Self-pay | Admitting: Internal Medicine

## 2018-06-06 ENCOUNTER — Ambulatory Visit: Payer: Medicare Other | Admitting: Internal Medicine

## 2018-06-06 VITALS — BP 138/60 | HR 76 | Temp 98.4°F | Ht 64.5 in | Wt 186.6 lb

## 2018-06-06 DIAGNOSIS — M170 Bilateral primary osteoarthritis of knee: Secondary | ICD-10-CM | POA: Insufficient documentation

## 2018-06-06 NOTE — Progress Notes (Signed)
Subjective:     Patient ID: Jessica Booker , female    DOB: 1940/09/18 , 77 y.o.   MRN: [id number]  HPI Pt is here because 1 1/2 weeks ago she developed swelling of both her knees and went down her lower legs. She saw her rheumatologist last week and had knee xrays which shows OA's. L>R. She gave her a steroid injection and now she is better. The swelling is gone.  She admits that prior to this pain, she had been very stressed helping her sick husband and she is not sleeping well and had been craving ice cream every night which she has been eating. She also is up and down her steps at home.     Past Medical History:  Diagnosis Date  . Hypertension   . 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  albuterol (PROVENTIL HFA;VENTOLIN HFA) 108 (90 BASE) MCG/ACT inhaler, Inhale 1-2 puffs into the lungs every 6 (six) hours as needed for wheezing., Disp: 1 Inhaler, Rfl: 0 .  aspirin 81 MG tablet, Take 81 mg by mouth daily., Disp: , Rfl:  .  budesonide-formoterol (SYMBICORT) 80-4.5 MCG/ACT inhaler, Take 2 puffs first thing in am and then another 2 puffs about 12 hours later., Disp: 1 Inhaler, Rfl: 111 .  hydrochlorothiazide (HYDRODIURIL) 25 MG tablet, Take 25 mg by mouth daily., Disp: , Rfl: 5 .  hydroxychloroquine (PLAQUENIL) 200 MG tablet, Take 200 mg po BID Monday thru Friday, Disp: 120 tablet, Rfl: 0 .  NIFEdipine (PROCARDIA-XL/ADALAT-CC/NIFEDICAL-XL) 30 MG 24 hr tablet, Take 30 mg by mouth daily., Disp: , Rfl: 2 .  Vitamin D, Ergocalciferol, (DRISDOL) 50000 units CAPS capsule, Take 1 capsule (50,000 Units total) by mouth every 7 (seven) days., Disp: 12 capsule, Rfl: 0   Review of Systems  Constitutional: Positive for fatigue and unexpected weight change.       Has not been exercising and been gaining wt.  Respiratory: Negative for chest tightness and shortness of breath.   Cardiovascular: Negative for chest pain, palpitations and leg swelling.   Musculoskeletal: Positive for arthralgias and joint swelling.       Se HPI  Skin: Negative for color change, rash and wound.  Psychiatric/Behavioral: Positive for sleep disturbance.     Today's Vitals   06/06/18 1214  BP: 138/60  Pulse: 76  Temp: 98.4 F (36.9 C)  TempSrc: Oral  SpO2: 98%  Weight: 186 lb 9.6 oz (84.6 kg)  Height: 5' 4.5" (1.638 m)   Body mass index is 31.54 kg/m.   Objective:  Physical Exam  Constitutional: She is oriented to person, place, and time. She appears well-developed and well-nourished. No distress.  HENT:  Head: Normocephalic.  Nose: Nose normal.  Eyes: Conjunctivae are normal. No scleral icterus.  Neck: Neck supple.  Cardiovascular: Normal rate and regular rhythm.  No murmur heard. No edema  Pulmonary/Chest: Effort normal and breath sounds normal.  Musculoskeletal: Normal range of motion. She exhibits tenderness. She exhibits no edema or deformity.  KNEES- has hypertrophy of L knee compared to the R. No effusion noted. Has crepitations in both of them with ROM. Has tenderness on lateral medial joint line with palpation.   Neurological: She is alert and oriented to person, place, and time.  Skin: Skin is warm and dry. Capillary refill takes less than 2 seconds. She is not diaphoretic.  Psychiatric: She has a normal mood and affect. Her behavior is normal. Judgment and  thought content normal.  Vitals reviewed.   Assessment And Plan:    OA of both kness- improved swelling and pain. I discussed with her about avoiding inflammatory foods and needs to work on watching her weight to help improve knee joint pain.  FU as scheduled       Rael Yo RODRIGUEZ-SOUTHWORTH, PA-C

## 2018-07-21 ENCOUNTER — Other Ambulatory Visit: Payer: Self-pay | Admitting: Physician Assistant

## 2018-08-10 ENCOUNTER — Other Ambulatory Visit: Payer: Self-pay | Admitting: Rheumatology

## 2018-08-11 NOTE — Telephone Encounter (Signed)
Patient advised will need to have Vitamin D level rechecked before refill.

## 2018-09-14 ENCOUNTER — Ambulatory Visit: Payer: Medicare Other | Admitting: Internal Medicine

## 2018-09-14 ENCOUNTER — Ambulatory Visit: Payer: Medicare Other

## 2018-09-19 ENCOUNTER — Telehealth: Payer: Self-pay | Admitting: Internal Medicine

## 2018-09-19 NOTE — Telephone Encounter (Signed)
Called to schedule Medicare Annual Wellness Visit with the Nurse Health Advisor. Patient cancelled on 09/13/2018 for the  09/14/2018 appointment.    If patient returns call, please schedule Medicare Annual Wellness Visit now.  Thank you! For any questions please contact: Trixie Rude at [phone] or Skype [email or web address]

## 2018-10-05 ENCOUNTER — Other Ambulatory Visit: Payer: Self-pay | Admitting: Internal Medicine

## 2018-10-12 NOTE — Telephone Encounter (Signed)
On 10/04/2018 at 4:57 PM, Patient scheduled her Medicare Annual Wellness for 11/02/2018 at 2:00 PM.  Trixie RudeLisa Collins, Care Guide.

## 2018-10-16 NOTE — Progress Notes (Signed)
Office Visit Note  Patient: Jessica Booker             Date of Birth: July 23, 1941           MRN: [id number]             PCP: Dorothyann PengSanders, Robyn, MD Referring: Dorothyann PengSanders, Robyn, MD Visit Date: 10/30/2018 Occupation: @GUAROCC @  Subjective:  Pain in both knee joints    History of Present Illness: Jessica Booker is a 78 y.o. female with history of systemic lupus erythematous, DDD, and osteoarthritis.  She is taking Plaquenil 200 mg 1 tablet po daily.  She reports she was previously taking Plaquenil 200 mg twice daily Monday through Friday but felt that when she was taking twice a day she had an increase in appetite.  She denies any recent lupus flares.  She presents today with pain in bilateral knee joints.  She had a left knee cortisone injection performed on 05/29/2018 the provided significant pain relief.  She denies any joint swelling at this time.  She states she occasionally experiences a catching sensation in both knee joints.  She denies any recent falls.  She reports that she has pain in bilateral hands but denies any joint swelling.  She has been having locking due to trigger fingers in the right middle and ring finger.  She denies any recent rashes, hair loss, photosensitivity.  She experiences intermittent symptoms of Raynaud's but denies any digital ulcerations.  She does report mild dryness at night but denies any eye dryness.  She denies any palpitations or shortness of breath.   She has started drinking trivita on a daily basis for 30 days. She reports she has noticed improvement since starting it.    Activities of Daily Living:  Patient reports morning stiffness for 20  minutes.   Patient Reports nocturnal pain.  Difficulty dressing/grooming: Denies Difficulty climbing stairs: Denies Difficulty getting out of chair: Denies Difficulty using hands for taps, buttons, cutlery, and/or writing: Denies  Review of Systems  Constitutional: Positive for fatigue. Negative for night  sweats.  HENT: Positive for mouth dryness. Negative for mouth sores and nose dryness.   Eyes: Negative for redness, visual disturbance and dryness.  Respiratory: Negative for cough, hemoptysis, shortness of breath and difficulty breathing.   Cardiovascular: Negative for chest pain, palpitations, hypertension, irregular heartbeat and swelling in legs/feet.  Gastrointestinal: Negative for blood in stool, constipation and diarrhea.  Endocrine: Negative for increased urination.  Genitourinary: Negative for painful urination.  Musculoskeletal: Positive for arthralgias, joint pain, myalgias, morning stiffness and myalgias. Negative for joint swelling, muscle weakness and muscle tenderness.  Skin: Negative for color change, rash, hair loss, nodules/bumps, skin tightness, ulcers and sensitivity to sunlight.  Allergic/Immunologic: Negative for susceptible to infections.  Neurological: Negative for dizziness, fainting, memory loss, night sweats and weakness.  Hematological: Negative for swollen glands.  Psychiatric/Behavioral: Negative for depressed mood and sleep disturbance. The patient is not nervous/anxious.     PMFS History:  Patient Active Problem List   Diagnosis Date Noted  . Primary osteoarthritis of both knees 06/06/2018  . Cough variant asthma 08/25/2017  . Systemic lupus erythematosus (HCC) 02/18/2017  . High risk medication use 02/18/2017  . Raynaud's disease without gangrene 02/18/2017  . DDD (degenerative disc disease), lumbar 02/18/2017  . Primary osteoarthritis of both hands 02/18/2017  . Primary osteoarthritis of both feet 02/18/2017  . Vitamin D deficiency 02/18/2017  . History of hypertension 02/18/2017  . History of neuropathy 02/18/2017  Past Medical History:  Diagnosis Date  . Hypertension   . Lupus (HCC)     Family History  Problem Relation Age of Onset  . Heart attack Mother   . Stomach cancer Father   . Hypertension Sister   . Heart disease Sister    Past  Surgical History:  Procedure Laterality Date  . ABDOMINAL HYSTERECTOMY    . CHOLECYSTECTOMY    . SHOULDER ARTHROSCOPY W/ ROTATOR CUFF REPAIR     R shoulder  . TONSILLECTOMY     Social History   Social History Narrative  . Not on file   Immunization History  Administered Date(s) Administered  . Influenza-Unspecified 05/30/2018     Objective: Vital Signs: BP 133/66 (BP Location: Left Arm, Patient Position: Sitting, Cuff Size: Small)   Pulse 62   Resp 12   Ht 5' 4.5" (1.638 m)   Wt 188 lb 12.8 oz (85.6 kg)   BMI 31.91 kg/m    Physical Exam Vitals signs and nursing note reviewed.  Constitutional:      Appearance: She is well-developed.  HENT:     Head: Normocephalic and atraumatic.     Comments: No parotid swelling or tenderness.    Nose:     Comments: No nasal ulcerations     Mouth/Throat:     Comments: No oral ulcerations  Eyes:     Conjunctiva/sclera: Conjunctivae normal.  Neck:     Musculoskeletal: Normal range of motion.  Cardiovascular:     Rate and Rhythm: Normal rate and regular rhythm.     Heart sounds: Normal heart sounds.  Pulmonary:     Effort: Pulmonary effort is normal.     Breath sounds: Normal breath sounds.  Abdominal:     General: Bowel sounds are normal.     Palpations: Abdomen is soft.  Lymphadenopathy:     Cervical: No cervical adenopathy.  Skin:    General: Skin is warm and dry.     Capillary Refill: Capillary refill takes less than 2 seconds.     Comments: No digital ulcerations or signs of gangrene.   No malar rash noted.   Neurological:     Mental Status: She is alert and oriented to person, place, and time.  Psychiatric:        Behavior: Behavior normal.      Musculoskeletal Exam: C-spine, thoracic spine, lumbar spine good range of motion.  No midline spinal tenderness.  No SI joint tenderness.  Shoulder joints, elbow joints, wrist joints, MCPs, PIPs, DIPs good range of motion with no synovitis.  She has PIP and DIP synovial  thickening consistent with osteoarthritis of bilateral hands.  She has mild CMC joint synovial thickening bilaterally.  Hip joints good range of motion with no discomfort.  She has tenderness over the left trochanteric bursa.  Knee joints good range of motion.  No warmth or effusion noted.  No tenderness or swelling of ankle joints.  Ankle joints, MTPs, PIPs, and DIPs good ROM with no synovitis.   CDAI Exam: CDAI Score: Not documented Patient Global Assessment: Not documented; Provider Global Assessment: Not documented Swollen: Not documented; Tender: Not documented Joint Exam   Not documented   There is currently no information documented on the homunculus. Go to the Rheumatology activity and complete the homunculus joint exam.  Investigation: No additional findings.  Imaging: No results found.  Recent Labs: Lab Results  Component Value Date   WBC 8.1 05/29/2018   HGB 11.5 (L) 05/29/2018   PLT 295  05/29/2018   NA 140 05/29/2018   K 3.7 05/29/2018   CL 104 05/29/2018   CO2 29 05/29/2018   GLUCOSE 98 05/29/2018   BUN 17 05/29/2018   CREATININE 0.87 05/29/2018   BILITOT 0.3 05/29/2018   ALKPHOS 77 03/24/2017   AST 16 05/29/2018   ALT 9 05/29/2018   PROT 7.6 05/29/2018   ALBUMIN 4.0 03/24/2017   CALCIUM 9.4 05/29/2018   GFRAA 74 05/29/2018    Speciality Comments: PLQ Eye Exam: 10/13/17 WNL @ [facility] Follow up in 1 year.   Procedures:  No procedures performed Allergies: Patient has no known allergies.     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  She is clinically doing well on Plaquenil 200 mg 1 tablet by mouth daily.  She was previously taking Plaquenil 200 mg twice daily Monday through Friday but altered her dose due to thinking it was increasing her appetite.  She has not noticed any symptoms of a  flare since changing her dose.  She has no synovitis on exam.she has pain in both knee joints but no warmth or effusion noted.  No Malar rash or skin lesions noted.  She has not had any photosensitivity or hair loss.  She continues to have intermittent symptoms of Raynaud's but no digital stations or signs of gangrene were noted.  She has been experiencing some mouth dryness at night but denies any eye dryness.  No parotid swelling or tenderness was noted.  She has not had any shortness of breath or palpitations recently.  Lungs were clear to auscultation on exam.  No oral or nasal ulcerations were noted.  She will continue on Plaquenil 200 mg 1 tablet by mouth daily.  She does not need any refills at this time.  She was advised to notify us if develops any signs or symptoms of a lupus flare.  We will check autoimmune lab work today.  She will follow-up in the office in 5 months.- Plan: Urinalysis, Routine w reflex microscopic, Anti-DNA antibody, double-stranded, C3 and C4, Sedimentation rate, VITAMIN D 25 Hydroxy (Vit-D Deficiency, Fractures)  High risk medication use - PLQ 200 mg 1 tablet daily. eye exam: 10/13/2017. She was given a PLQ eye exam form to take with her.  CBC and CMP were drawn today to monitor for drug toxicity.  - Plan: CBC with Differential/Platelet, COMPLETE METABOLIC PANEL WITH GFR  Raynaud's disease without gangrene: She continues to have intermittent symptoms of Raynaud's.  She has no digital ulcerations or signs of gangrene. She was encouraged to wear gloves and socks and keep her core body temperature warm.   Primary osteoarthritis of both hands: She has PIP and DIP synovial thickening consistent with osteoarthritis.  She has no synovitis on exam. Complete fist formation bilaterally.  Joint protection and muscle strengthening were discussed.    Primary osteoarthritis of both knees: She presents today with bilateral knee pain.  She is good range of motion in both knees.  No warmth or  effusion was noted.  X-rays of the right knee were obtained on 07/27/2017 that revealed mild osteoarthritis and moderate chondromalacia patella.  X-rays of the left knee were obtained on 05/29/2018 that revealed moderate osteoarthritis and moderate chondromalacia patella.  She had a left knee cortisone injection performed on 05/29/2018 that provided significant pain relief.  She was given  a prescription for Voltaren gel which she can apply topically as prescribed.  Primary osteoarthritis of both feet: She has no discomfort in her feet at this time.  She wears proper fitting shoes.   DDD (degenerative disc disease), lumbar: She has intermittent lower back pain.  No midline spinal tenderness at this time.   Vitamin D deficiency: She was previously taking vitamin D 50,000 units by mouth once weekly.  We will check vitamin D level today. Ideally her vitamin D should be between 50-60 with history of lupus.   Other medical conditions are listed as follows:   History of neuropathy  History of hypertension   Orders: Orders Placed This Encounter  Procedures  . CBC with Differential/Platelet  . COMPLETE METABOLIC PANEL WITH GFR  . Urinalysis, Routine w reflex microscopic  . Anti-DNA antibody, double-stranded  . C3 and C4  . Sedimentation rate  . VITAMIN D 25 Hydroxy (Vit-D Deficiency, Fractures)   Meds ordered this encounter  Medications  . diclofenac sodium (VOLTAREN) 1 % GEL    Sig: Apply 2 g to 4 g to affected joint up to 4 times daily PRN.    Dispense:  4 Tube    Refill:  2    Face-to-face time spent with patient was 30 minutes. Greater than 50% of time was spent in counseling and coordination of care.  Follow-Up Instructions: Return in about 5 months (around 04/01/2019) for Systemic lupus erythematosus.   Gearldine Bienenstockaylor M Eliodoro Gullett, PA-C  Note - This record has been created using Dragon software.  Chart creation errors have been sought, but may not always  have been located. Such creation errors  do not reflect on  the standard of medical care.

## 2018-10-30 ENCOUNTER — Encounter: Payer: Self-pay | Admitting: Physician Assistant

## 2018-10-30 ENCOUNTER — Ambulatory Visit: Payer: Medicare Other | Admitting: Physician Assistant

## 2018-10-30 VITALS — BP 133/66 | HR 62 | Resp 12 | Ht 64.5 in | Wt 188.8 lb

## 2018-10-30 DIAGNOSIS — I7300 Raynaud's syndrome without gangrene: Secondary | ICD-10-CM | POA: Diagnosis not present

## 2018-10-30 DIAGNOSIS — M3219 Other organ or system involvement in systemic lupus erythematosus: Secondary | ICD-10-CM

## 2018-10-30 DIAGNOSIS — Z79899 Other long term (current) drug therapy: Secondary | ICD-10-CM | POA: Diagnosis not present

## 2018-10-30 DIAGNOSIS — M170 Bilateral primary osteoarthritis of knee: Secondary | ICD-10-CM

## 2018-10-30 DIAGNOSIS — M19072 Primary osteoarthritis, left ankle and foot: Secondary | ICD-10-CM

## 2018-10-30 DIAGNOSIS — E559 Vitamin D deficiency, unspecified: Secondary | ICD-10-CM

## 2018-10-30 DIAGNOSIS — M19042 Primary osteoarthritis, left hand: Secondary | ICD-10-CM

## 2018-10-30 DIAGNOSIS — M19041 Primary osteoarthritis, right hand: Secondary | ICD-10-CM | POA: Diagnosis not present

## 2018-10-30 DIAGNOSIS — M5136 Other intervertebral disc degeneration, lumbar region: Secondary | ICD-10-CM

## 2018-10-30 DIAGNOSIS — Z8669 Personal history of other diseases of the nervous system and sense organs: Secondary | ICD-10-CM

## 2018-10-30 DIAGNOSIS — Z8679 Personal history of other diseases of the circulatory system: Secondary | ICD-10-CM

## 2018-10-30 DIAGNOSIS — M19071 Primary osteoarthritis, right ankle and foot: Secondary | ICD-10-CM

## 2018-10-30 MED ORDER — DICLOFENAC SODIUM 1 % TD GEL: TRANSDERMAL | 2 refills | Status: AC

## 2018-10-31 LAB — CBC WITH DIFFERENTIAL/PLATELET
ABSOLUTE MONOCYTES: 558 {cells}/uL (ref 200–950)
BASOS PCT: 0.4 %
Basophils Absolute: 33 cells/uL (ref 0–200)
Eosinophils Absolute: 180 cells/uL (ref 15–500)
Eosinophils Relative: 2.2 %
HCT: 36.8 % (ref 35.0–45.0)
Hemoglobin: 11.8 g/dL (ref 11.7–15.5)
Lymphs Abs: 2665 cells/uL (ref 850–3900)
MCH: 25.8 pg — ABNORMAL LOW (ref 27.0–33.0)
MCHC: 32.1 g/dL (ref 32.0–36.0)
MCV: 80.3 fL (ref 80.0–100.0)
MONOS PCT: 6.8 %
MPV: 10.8 fL (ref 7.5–12.5)
Neutro Abs: 4764 cells/uL (ref 1500–7800)
Neutrophils Relative %: 58.1 %
PLATELETS: 336 10*3/uL (ref 140–400)
RBC: 4.58 10*6/uL (ref 3.80–5.10)
RDW: 13.1 % (ref 11.0–15.0)
Total Lymphocyte: 32.5 %
WBC: 8.2 10*3/uL (ref 3.8–10.8)

## 2018-10-31 LAB — URINALYSIS, ROUTINE W REFLEX MICROSCOPIC
Bacteria, UA: NONE SEEN /HPF
Bilirubin Urine: NEGATIVE
Glucose, UA: NEGATIVE
Hgb urine dipstick: NEGATIVE
Ketones, ur: NEGATIVE
Nitrite: NEGATIVE
Protein, ur: NEGATIVE
RBC / HPF: NONE SEEN /HPF (ref 0–2)
Specific Gravity, Urine: 1.009 (ref 1.001–1.03)
WBC, UA: NONE SEEN /HPF (ref 0–5)
pH: 6 (ref 5.0–8.0)

## 2018-10-31 LAB — COMPLETE METABOLIC PANEL WITH GFR
AG RATIO: 1.1 (calc) (ref 1.0–2.5)
ALBUMIN MSPROF: 4.2 g/dL (ref 3.6–5.1)
ALT: 11 U/L (ref 6–29)
AST: 17 U/L (ref 10–35)
Alkaline phosphatase (APISO): 80 U/L (ref 37–153)
BUN: 19 mg/dL (ref 7–25)
CO2: 30 mmol/L (ref 20–32)
CREATININE: 0.91 mg/dL (ref 0.60–0.93)
Calcium: 9.5 mg/dL (ref 8.6–10.4)
Chloride: 100 mmol/L (ref 98–110)
GFR, EST NON AFRICAN AMERICAN: 61 mL/min/{1.73_m2} (ref >=60)
GFR, Est African American: 71 mL/min/{1.73_m2} (ref >=60)
GLOBULIN: 3.7 g/dL (ref 1.9–3.7)
Glucose, Bld: 80 mg/dL (ref 65–99)
Potassium: 3.6 mmol/L (ref 3.5–5.3)
SODIUM: 138 mmol/L (ref 135–146)
Total Bilirubin: 0.5 mg/dL (ref 0.2–1.2)
Total Protein: 7.9 g/dL (ref 6.1–8.1)

## 2018-10-31 LAB — VITAMIN D 25 HYDROXY (VIT D DEFICIENCY, FRACTURES): Vit D, 25-Hydroxy: 31 ng/mL (ref 30–100)

## 2018-10-31 LAB — C3 AND C4
C3 COMPLEMENT: 144 mg/dL (ref 83–193)
C4 Complement: 31 mg/dL (ref 15–57)

## 2018-10-31 LAB — ANTI-DNA ANTIBODY, DOUBLE-STRANDED: DS DNA AB: 4 [IU]/mL

## 2018-10-31 LAB — SEDIMENTATION RATE: Sed Rate: 31 mm/h — ABNORMAL HIGH (ref 0–30)

## 2018-10-31 NOTE — Progress Notes (Signed)
MCH borderline low.  Rest of CBC WNL.  CMP WNL.  Complements WNL.  Sed rate is borderline elevated-31.  Vitamin D is 31.  Please advise patient to continue on a maintenance dose of vitamin D.  UA revealed trace leukocytes and 0-5 hyaline casts.  Please notify patient and forward labs to PCP.

## 2018-10-31 NOTE — Progress Notes (Signed)
DsDNA is 4.  Considered negative.

## 2018-11-02 ENCOUNTER — Encounter: Payer: Self-pay | Admitting: Nurse Practitioner

## 2018-11-02 ENCOUNTER — Ambulatory Visit: Payer: Medicare Other | Admitting: Nurse Practitioner

## 2018-11-02 ENCOUNTER — Ambulatory Visit (INDEPENDENT_AMBULATORY_CARE_PROVIDER_SITE_OTHER): Payer: Medicare Other

## 2018-11-02 VITALS — BP 146/68 | HR 71 | Temp 97.9°F | Ht 61.6 in | Wt 188.2 lb

## 2018-11-02 DIAGNOSIS — I10 Essential (primary) hypertension: Secondary | ICD-10-CM | POA: Diagnosis not present

## 2018-11-02 DIAGNOSIS — M329 Systemic lupus erythematosus, unspecified: Secondary | ICD-10-CM | POA: Insufficient documentation

## 2018-11-02 DIAGNOSIS — M25471 Effusion, right ankle: Secondary | ICD-10-CM

## 2018-11-02 DIAGNOSIS — M25473 Effusion, unspecified ankle: Secondary | ICD-10-CM

## 2018-11-02 DIAGNOSIS — M25472 Effusion, left ankle: Secondary | ICD-10-CM | POA: Diagnosis not present

## 2018-11-02 DIAGNOSIS — Z23 Encounter for immunization: Secondary | ICD-10-CM | POA: Diagnosis not present

## 2018-11-02 DIAGNOSIS — Z0000 Encounter for general adult medical examination without abnormal findings: Secondary | ICD-10-CM

## 2018-11-02 DIAGNOSIS — IMO0002 Reserved for concepts with insufficient information to code with codable children: Secondary | ICD-10-CM | POA: Insufficient documentation

## 2018-11-02 LAB — POCT URINALYSIS DIPSTICK
Bilirubin, UA: NEGATIVE
GLUCOSE UA: NEGATIVE
KETONES UA: NEGATIVE
NITRITE UA: NEGATIVE
Protein, UA: NEGATIVE
SPEC GRAV UA: 1.015 (ref 1.010–1.025)
Urobilinogen, UA: 0.2 E.U./dL
pH, UA: 6 (ref 5.0–8.0)

## 2018-11-02 MED ORDER — PNEUMOCOCCAL 13-VAL CONJ VACC IM SUSP: 0.5000 mL | INTRAMUSCULAR | 0 refills | Status: AC

## 2018-11-02 NOTE — Patient Instructions (Signed)
Ms. Helmuth , Thank you for taking time to come for your Medicare Wellness Visit. I appreciate your ongoing commitment to your health goals. Please review the following plan we discussed and let me know if I can assist you in the future.   Screening recommendations/referrals: Colonoscopy: 07/2011 Mammogram: 01/2018 Bone Density: 07/2012 Recommended yearly ophthalmology/optometry visit for glaucoma screening and checkup Recommended yearly dental visit for hygiene and checkup  Vaccinations: Influenza vaccine: 05/2018 Pneumococcal vaccine: sent to pharmacy Tdap vaccine: 12/2011 Shingles vaccine: 07/2018  Advanced directives: Please bring a copy of your POA (Power of [place]) and/or Living Will to your next appointment.    Conditions/risks identified: Obesity  Next appointment: 03/08/2019 at 200p   Preventive Care 65 Years and Older, Female Preventive care refers to lifestyle choices and visits with your health care provider that can promote health and wellness. What does preventive care include?  A yearly physical exam. This is also called an annual well check.  Dental exams once or twice a year.  Routine eye exams. Ask your health care provider how often you should have your eyes checked.  Personal lifestyle choices, including:  Daily care of your teeth and gums.  Regular physical activity.  Eating a healthy diet.  Avoiding tobacco and drug use.  Limiting alcohol use.  Practicing safe sex.  Taking low-dose aspirin every day.  Taking vitamin and mineral supplements as recommended by your health care provider. What happens during an annual well check? The services and screenings done by your health care provider during your annual well check will depend on your age, overall health, lifestyle risk factors, and family history of disease. Counseling  Your health care provider may ask you questions about your:  Alcohol use.  Tobacco use.  Drug use.  Emotional  well-being.  Home and relationship well-being.  Sexual activity.  Eating habits.  History of falls.  Memory and ability to understand (cognition).  Work and work Astronomer.  Reproductive health. Screening  You may have the following tests or measurements:  Height, weight, and BMI.  Blood pressure.  Lipid and cholesterol levels. These may be checked every 5 years, or more frequently if you are over 50 years old.  Skin check.  Lung cancer screening. You may have this screening every year starting at age 40 if you have a 30-pack-year history of smoking and currently smoke or have quit within the past 15 years.  Fecal occult blood test (FOBT) of the stool. You may have this test every year starting at age 57.  Flexible sigmoidoscopy or colonoscopy. You may have a sigmoidoscopy every 5 years or a colonoscopy every 10 years starting at age 56.  Hepatitis C blood test.  Hepatitis B blood test.  Sexually transmitted disease (STD) testing.  Diabetes screening. This is done by checking your blood sugar (glucose) after you have not eaten for a while (fasting). You may have this done every 1-3 years.  Bone density scan. This is done to screen for osteoporosis. You may have this done starting at age 84.  Mammogram. This may be done every 1-2 years. Talk to your health care provider about how often you should have regular mammograms. Talk with your health care provider about your test results, treatment options, and if necessary, the need for more tests. Vaccines  Your health care provider may recommend certain vaccines, such as:  Influenza vaccine. This is recommended every year.  Tetanus, diphtheria, and acellular pertussis (Tdap, Td) vaccine. You may need a Td booster  every 10 years.  Zoster vaccine. You may need this after age 80.  Pneumococcal 13-valent conjugate (PCV13) vaccine. One dose is recommended after age 88.  Pneumococcal polysaccharide (PPSV23) vaccine. One  dose is recommended after age 62. Talk to your health care provider about which screenings and vaccines you need and how often you need them. This information is not intended to replace advice given to you by your health care provider. Make sure you discuss any questions you have with your health care provider. Document Released: 09/12/2015 Document Revised: 05/05/2016 Document Reviewed: 06/17/2015 Elsevier Interactive Patient Education  2017 [place] Prevention in the Home Falls can cause injuries. They can happen to people of all ages. There are many things you can do to make your home safe and to help prevent falls. What can I do on the outside of my home?  Regularly fix the edges of walkways and driveways and fix any cracks.  Remove anything that might make you trip as you walk through a door, such as a raised step or threshold.  Trim any bushes or trees on the path to your home.  Use bright outdoor lighting.  Clear any walking paths of anything that might make someone trip, such as rocks or tools.  Regularly check to see if handrails are loose or broken. Make sure that both sides of any steps have handrails.  Any raised decks and porches should have guardrails on the edges.  Have any leaves, snow, or ice cleared regularly.  Use sand or salt on walking paths during winter.  Clean up any spills in your garage right away. This includes oil or grease spills. What can I do in the bathroom?  Use night lights.  Install grab bars by the toilet and in the tub and shower. Do not use towel bars as grab bars.  Use non-skid mats or decals in the tub or shower.  If you need to sit down in the shower, use a plastic, non-slip stool.  Keep the floor dry. Clean up any water that spills on the floor as soon as it happens.  Remove soap buildup in the tub or shower regularly.  Attach bath mats securely with double-sided non-slip rug tape.  Do not have throw rugs and other  things on the floor that can make you trip. What can I do in the bedroom?  Use night lights.  Make sure that you have a light by your bed that is easy to reach.  Do not use any sheets or blankets that are too big for your bed. They should not hang down onto the floor.  Have a firm chair that has side arms. You can use this for support while you get dressed.  Do not have throw rugs and other things on the floor that can make you trip. What can I do in the kitchen?  Clean up any spills right away.  Avoid walking on wet floors.  Keep items that you use a lot in easy-to-reach places.  If you need to reach something above you, use a strong step stool that has a grab bar.  Keep electrical cords out of the way.  Do not use floor polish or wax that makes floors slippery. If you must use wax, use non-skid floor wax.  Do not have throw rugs and other things on the floor that can make you trip. What can I do with my stairs?  Do not leave any items on the stairs.  Make  sure that there are handrails on both sides of the stairs and use them. Fix handrails that are broken or loose. Make sure that handrails are as long as the stairways.  Check any carpeting to make sure that it is firmly attached to the stairs. Fix any carpet that is loose or worn.  Avoid having throw rugs at the top or bottom of the stairs. If you do have throw rugs, attach them to the floor with carpet tape.  Make sure that you have a light switch at the top of the stairs and the bottom of the stairs. If you do not have them, ask someone to add them for you. What else can I do to help prevent falls?  Wear shoes that:  Do not have high heels.  Have rubber bottoms.  Are comfortable and fit you well.  Are closed at the toe. Do not wear sandals.  If you use a stepladder:  Make sure that it is fully opened. Do not climb a closed stepladder.  Make sure that both sides of the stepladder are locked into place.  Ask  someone to hold it for you, if possible.  Clearly mark and make sure that you can see:  Any grab bars or handrails.  First and last steps.  Where the edge of each step is.  Use tools that help you move around (mobility aids) if they are needed. These include:  Canes.  Walkers.  Scooters.  Crutches.  Turn on the lights when you go into a dark area. Replace any light bulbs as soon as they burn out.  Set up your furniture so you have a clear path. Avoid moving your furniture around.  If any of your floors are uneven, fix them.  If there are any pets around you, be aware of where they are.  Review your medicines with your doctor. Some medicines can make you feel dizzy. This can increase your chance of falling. Ask your doctor what other things that you can do to help prevent falls. This information is not intended to replace advice given to you by your health care provider. Make sure you discuss any questions you have with your health care provider. Document Released: 06/12/2009 Document Revised: 01/22/2016 Document Reviewed: 09/20/2014 Elsevier Interactive Patient Education  2017 [employer].

## 2018-11-02 NOTE — Progress Notes (Addendum)
Subjective:     Patient ID: Jessica Booker , female    DOB: 01-10-1941 , 78 y.o.   MRN: [id number]   Chief Complaint  Patient presents with  . Hypertension    HPI  Hypertension  This is a chronic problem. The current episode started more than 1 year ago. The problem is controlled. Pertinent negatives include no chest pain, headaches, palpitations or shortness of breath. There are no associated agents to hypertension. There are no known risk factors for coronary artery disease. Past treatments include diuretics.     Past Medical History:  Diagnosis Date  . Hypertension   . Lupus (HCC)      Family History  Problem Relation Age of Onset  . Heart attack Mother   . Stomach cancer Father   . Hypertension Sister   . Heart disease Sister      Current Outpatient Medications:  .  albuterol (PROVENTIL HFA;VENTOLIN HFA) 108 (90 BASE) MCG/ACT inhaler, Inhale 1-2 puffs into the lungs every 6 (six) hours as needed for wheezing., Disp: 1 Inhaler, Rfl: 0 .  aspirin 81 MG tablet, Take 81 mg by mouth daily. Takes as needed, Disp: , Rfl:  .  budesonide-formoterol (SYMBICORT) 80-4.5 MCG/ACT inhaler, Take 2 puffs first thing in am and then another 2 puffs about 12 hours later. (Patient not taking: Reported on 10/30/2018), Disp: 1 Inhaler, Rfl: 111 .  cholecalciferol (VITAMIN D3) 25 MCG (1000 UT) tablet, Take 1,000 Units by mouth daily., Disp: , Rfl:  .  diclofenac sodium (VOLTAREN) 1 % GEL, Apply 2 g to 4 g to affected joint up to 4 times daily PRN., Disp: 4 Tube, Rfl: 2 .  esomeprazole (NEXIUM) 40 MG capsule, TAKE 1 CAPSULE DAILY 30 MINUTES BEFORE MEAL, Disp: , Rfl:  .  hydrochlorothiazide (HYDRODIURIL) 25 MG tablet, TAKE 1 TABLET BY MOUTH EVERY DAY, Disp: 90 tablet, Rfl: 2 .  hydroxychloroquine (PLAQUENIL) 200 MG tablet, Take 200 mg po BID Monday thru Friday, Disp: 120 tablet, Rfl: 0 .  NIFEdipine (ADALAT CC) 60 MG 24 hr tablet, TAKE 1 TABLET BY MOUTH EVERY DAY, Disp: 90 tablet, Rfl: 1 .   Vitamin D, Ergocalciferol, (DRISDOL) 50000 units CAPS capsule, Take 1 capsule (50,000 Units total) by mouth every 7 (seven) days. (Patient not taking: Reported on 10/30/2018), Disp: 12 capsule, Rfl: 0   No Known Allergies   Review of Systems  Constitutional: Negative.  Negative for fatigue.  Eyes: Negative for visual disturbance.  Respiratory: Negative.  Negative for shortness of breath.   Cardiovascular: Negative.  Negative for chest pain, palpitations and leg swelling.  Gastrointestinal: Negative.   Endocrine: Negative.   Musculoskeletal: Negative.   Skin: Negative.   Neurological: Negative for dizziness, weakness and headaches.  Psychiatric/Behavioral: Negative for confusion. The patient is not nervous/anxious.      There were no vitals filed for this visit. There is no height or weight on file to calculate BMI.   Objective:  Physical Exam Vitals signs reviewed.  Constitutional:      Appearance: She is well-developed.  HENT:     Head: Normocephalic and atraumatic.  Eyes:     Pupils: Pupils are equal, round, and reactive to light.  Cardiovascular:     Rate and Rhythm: Normal rate and regular rhythm.     Pulses: Normal pulses.     Heart sounds: Normal heart sounds. No murmur.  Pulmonary:     Effort: Pulmonary effort is normal.     Breath sounds: Normal breath sounds.  Musculoskeletal: Normal range of motion.        General: Swelling (bilateral 1+ edema ) present.  Skin:    General: Skin is warm and dry.     Capillary Refill: Capillary refill takes less than 2 seconds.  Neurological:     General: No focal deficit present.     Mental Status: She is alert and oriented to person, place, and time.     Cranial Nerves: No cranial nerve deficit.  Psychiatric:        Mood and Affect: Mood normal.        Behavior: Behavior normal.        Thought Content: Thought content normal.        Judgment: Judgment normal.         Assessment And Plan:     1. Essential  hypertension . B/P is poorly controlled.  . She is advised to make sure she is taking her medications regularly . CMP done at Rheumatologist and was within normal limits . The importance of regular exercise and dietary modification was stressed to the patient.   2. Lupus (HCC)  Chronic, stable  3. Ankle swelling, unspecified laterality  Encouraged to keep feet elevated  Wear support hose while awake  Limit intake of high salt foods    Arnette Felts, FNP

## 2018-11-02 NOTE — Progress Notes (Signed)
Subjective:   Jessica Booker is a 78 y.o. female who presents for Medicare Annual (Subsequent) preventive examination.  Review of Systems:  n/a Cardiac Risk Factors include: advanced age (>71men, >26 women);hypertension;obesity (BMI >30kg/m2);sedentary lifestyle     Objective:     Vitals: BP (!) 146/68 (BP Location: Right Arm, Patient Position: Sitting)   Pulse 71   Temp 97.9 F (36.6 C) (Oral)   Ht 5' 1.6" (1.565 m)   Wt 188 lb 4 oz (85.4 kg)   SpO2 97%   BMI 34.88 kg/m   Body mass index is 34.88 kg/m.  Advanced Directives 11/02/2018 02/23/2016 08/04/2015  Does Patient Have a Medical Advance Directive? Yes No No  Type of Advance Directive Living will;Healthcare Power of Attorney - -  Does patient want to make changes to medical advance directive? No - Patient declined - -  Copy of Healthcare Power of Attorney in Chart? No - copy requested - -    Tobacco Social History   Tobacco Use  Smoking Status Never Smoker  Smokeless Tobacco Never Used     Counseling given: Not Answered   Clinical Intake:  Pre-visit preparation completed: Yes  Pain : No/denies pain Pain Score: 0-No pain(abdominal pain when walking at times)     Nutritional Status: BMI > 30  Obese Nutritional Risks: None Diabetes: No  How often do you need to have someone help you when you read instructions, pamphlets, or other written materials from your doctor or pharmacy?: 1 - Never What is the last grade level you completed in school?: graduated college  Interpreter Needed?: No  Information entered by :: NAllen LPN  Past Medical History:  Diagnosis Date  . Hypertension   . Lupus [facility])    Past Surgical History:  Procedure Laterality Date  . ABDOMINAL HYSTERECTOMY    . CHOLECYSTECTOMY    . SHOULDER ARTHROSCOPY W/ ROTATOR CUFF REPAIR     R shoulder  . TONSILLECTOMY     Family History  Problem Relation Age of Onset  . Heart attack Mother   . Stomach cancer Father   . Hypertension  Sister   . Heart disease Sister    Social History   Socioeconomic History  . Marital status: Married    Spouse name: Not on file  . Number of children: Not on file  . Years of education: Not on file  . Highest education level: Not on file  Occupational History  . Occupation: retired  Engineer, production  . Financial resource strain: Not hard at all  . Food insecurity:    Worry: Never true    Inability: Never true  . Transportation needs:    Medical: No    Non-medical: No  Tobacco Use  . Smoking status: Never Smoker  . Smokeless tobacco: Never Used  Substance and Sexual Activity  . Alcohol use: No  . Drug use: No  . Sexual activity: Yes    Birth control/protection: Surgical  Lifestyle  . Physical activity:    Days per week: 0 days    Minutes per session: 0 min  . Stress: Not at all  Relationships  . Social connections:    Talks on phone: Not on file    Gets together: Not on file    Attends religious service: Not on file    Active member of club or organization: Not on file    Attends meetings of clubs or organizations: Not on file    Relationship status: Not on file  Other  Topics Concern  . Not on file  Social History Narrative  . Not on file    Outpatient Encounter Medications as of 11/02/2018  Medication Sig  . albuterol (PROVENTIL HFA;VENTOLIN HFA) 108 (90 BASE) MCG/ACT inhaler Inhale 1-2 puffs into the lungs every 6 (six) hours as needed for wheezing.  Marland Kitchen aspirin 81 MG tablet Take 81 mg by mouth daily. Takes as needed  . cholecalciferol (VITAMIN D3) 25 MCG (1000 UT) tablet Take 1,000 Units by mouth daily.  . diclofenac sodium (VOLTAREN) 1 % GEL Apply 2 g to 4 g to affected joint up to 4 times daily PRN.  Marland Kitchen esomeprazole (NEXIUM) 40 MG capsule TAKE 1 CAPSULE DAILY 30 MINUTES BEFORE MEAL  . hydrochlorothiazide (HYDRODIURIL) 25 MG tablet Take 25 mg by mouth daily.  . hydroxychloroquine (PLAQUENIL) 200 MG tablet Take 200 mg po BID Monday thru Friday  . NIFEdipine (ADALAT  CC) 60 MG 24 hr tablet TAKE 1 TABLET BY MOUTH EVERY DAY  . budesonide-formoterol (SYMBICORT) 80-4.5 MCG/ACT inhaler Take 2 puffs first thing in am and then another 2 puffs about 12 hours later. (Patient not taking: Reported on 10/30/2018)  . pneumococcal 13-valent conjugate vaccine (PREVNAR 13) SUSP injection Inject 0.5 mLs into the muscle tomorrow at 10 am for 1 dose.  . Vitamin D, Ergocalciferol, (DRISDOL) 50000 units CAPS capsule Take 1 capsule (50,000 Units total) by mouth every 7 (seven) days. (Patient not taking: Reported on 10/30/2018)  . [DISCONTINUED] NIFEdipine (PROCARDIA-XL/ADALAT-CC/NIFEDICAL-XL) 30 MG 24 hr tablet Take 30 mg by mouth daily.   No facility-administered encounter medications on file as of 11/02/2018.     Activities of Daily Living In your present state of health, do you have any difficulty performing the following activities: 11/02/2018  Hearing? N  Vision? N  Difficulty concentrating or making decisions? N  Walking or climbing stairs? N  Dressing or bathing? N  Doing errands, shopping? N  Preparing Food and eating ? N  Using the Toilet? N  In the past six months, have you accidently leaked urine? N  Do you have problems with loss of bowel control? N  Managing your Medications? N  Managing your Finances? N  Housekeeping or managing your Housekeeping? N  Some recent data might be hidden    Patient Care Team: Dorothyann Peng, MD as PCP - General (Internal Medicine) Pollyann Savoy, MD as Consulting Physician (Rheumatology) Antony Contras, MD as Consulting Physician (Ophthalmology)    Assessment:   This is a routine wellness examination for Jessica Booker.  Exercise Activities and Dietary recommendations Current Exercise Habits: The patient does not participate in regular exercise at present  Goals    . Patient Stated (pt-stated)     Take time for self. Eat healthier and go to the gym       Fall Risk Fall Risk  11/02/2018 06/06/2018  Falls in the past year? 0 No    Risk for fall due to : Medication side effect;Impaired mobility -  Follow up Falls prevention discussed;Education provided -   Is the patient's home free of loose throw rugs in walkways, pet beds, electrical cords, etc?   yes      Grab bars in the bathroom? yes      Handrails on the stairs?   yes      Adequate lighting?   yes  Timed Get Up and Go performed: n/a  Depression Screen PHQ 2/9 Scores 11/02/2018 06/06/2018  PHQ - 2 Score 0 0  PHQ- 9 Score 0 -  Cognitive Function     6CIT Screen 11/02/2018  What Year? 0 points  What month? 0 points  What time? 0 points  Count back from 20 0 points  Months in reverse 0 points  Repeat phrase 2 points  Total Score 2    Immunization History  Administered Date(s) Administered  . Influenza-Unspecified 05/30/2018  . Zoster Recombinat (Shingrix) 05/29/2018, 08/01/2018    Qualifies for Shingles Vaccine? yes  Screening Tests Health Maintenance  Topic Date Due  . PNA vac Low Risk Adult (1 of 2 - PCV13) 02/20/2006  . TETANUS/TDAP  01/10/2022  . INFLUENZA VACCINE  Completed  . DEXA SCAN  Completed    Cancer Screenings: Lung: Low Dose CT Chest recommended if Age 91-80 years, 30 pack-year currently smoking OR have quit w/in 15years. Patient does not qualify. Breast:  Up to date on Mammogram? Yes   Up to date of Bone Density/Dexa? Yes Colorectal: 07/2011  Additional Screenings: : Hepatitis C Screening: n/a     Plan:   Wants to take time for self. Eat healthier. Prevnar13 sent to pharmacy  I have personally reviewed and noted the following in the patient's chart:   . Medical and social history . Use of alcohol, tobacco or illicit drugs  . Current medications and supplements . Functional ability and status . Nutritional status . Physical activity . Advanced directives . List of other physicians . Hospitalizations, surgeries, and ER visits in previous 12 months . Vitals . Screenings to include cognitive, depression, and  falls . Referrals and appointments  In addition, I have reviewed and discussed with patient certain preventive protocols, quality metrics, and best practice recommendations. A written personalized care plan for preventive services as well as general preventive health recommendations were provided to patient.     Barb Merino, LPN  09/06/8414

## 2018-11-06 ENCOUNTER — Other Ambulatory Visit: Payer: Self-pay | Admitting: Nurse Practitioner

## 2018-11-17 ENCOUNTER — Telehealth: Payer: Self-pay | Admitting: Pharmacy Technician

## 2018-11-17 DIAGNOSIS — M3219 Other organ or system involvement in systemic lupus erythematosus: Secondary | ICD-10-CM

## 2018-11-17 MED ORDER — HYDROXYCHLOROQUINE SULFATE 200 MG PO TABS: ORAL_TABLET | ORAL | 0 refills | Status: DC

## 2018-11-17 NOTE — Telephone Encounter (Signed)
Patient is on Plaquenil. Please send in prescription  Called patient and advised. Please send to [facility] [address]

## 2019-01-09 ENCOUNTER — Other Ambulatory Visit: Payer: Self-pay | Admitting: Rheumatology

## 2019-01-09 DIAGNOSIS — M3219 Other organ or system involvement in systemic lupus erythematosus: Secondary | ICD-10-CM

## 2019-01-09 NOTE — Telephone Encounter (Signed)
Last Visit: 10/30/18 Next Visit: 04/02/19 Labs: 10/30/18 MCH borderline low. Rest of CBC WNL. CMP WNL. PLQ Eye Exam: 01/04/19 WNL   Okay to refill per Dr. Corliss Skains

## 2019-03-08 ENCOUNTER — Ambulatory Visit: Payer: Medicare Other | Admitting: Internal Medicine

## 2019-03-19 NOTE — Progress Notes (Deleted)
Office Visit Note  Patient: Jessica Booker             Date of Birth: 1941/07/11           MRN: [id number]             PCP: Dorothyann PengSanders, Robyn, MD Referring: Dorothyann PengSanders, Robyn, MD Visit Date: 04/02/2019 Occupation: @GUAROCC @  Subjective:  No chief complaint on file.   History of Present Illness: Jessica Booker is a 78 y.o. female ***   Activities of Daily Living:  Patient reports morning stiffness for *** {minute/hour:19697}.   Patient {ACTIONS;DENIES/REPORTS:21021675::"Denies"} nocturnal pain.  Difficulty dressing/grooming: {ACTIONS;DENIES/REPORTS:21021675::"Denies"} Difficulty climbing stairs: {ACTIONS;DENIES/REPORTS:21021675::"Denies"} Difficulty getting out of chair: {ACTIONS;DENIES/REPORTS:21021675::"Denies"} Difficulty using hands for taps, buttons, cutlery, and/or writing: {ACTIONS;DENIES/REPORTS:21021675::"Denies"}  No Rheumatology ROS completed.   PMFS History:  Patient Active Problem List   Diagnosis Date Noted  . Essential hypertension 11/02/2018  . Lupus (HCC) 11/02/2018  . Primary osteoarthritis of both knees 06/06/2018  . Cough variant asthma 08/25/2017  . Systemic lupus erythematosus (HCC) 02/18/2017  . High risk medication use 02/18/2017  . Raynaud's disease without gangrene 02/18/2017  . DDD (degenerative disc disease), lumbar 02/18/2017  . Primary osteoarthritis of both hands 02/18/2017  . Primary osteoarthritis of both feet 02/18/2017  . Vitamin D deficiency 02/18/2017  . History of hypertension 02/18/2017  . History of neuropathy 02/18/2017    Past Medical History:  Diagnosis Date  . Hypertension   . Lupus (HCC)     Family History  Problem Relation Age of Onset  . Heart attack Mother   . Stomach cancer Father   . Hypertension Sister   . Heart disease Sister    Past Surgical History:  Procedure Laterality Date  . ABDOMINAL HYSTERECTOMY    . CHOLECYSTECTOMY    . SHOULDER ARTHROSCOPY W/ ROTATOR CUFF REPAIR     R shoulder  . TONSILLECTOMY      Social History   Social History Narrative  . Not on file   Immunization History  Administered Date(s) Administered  . Influenza-Unspecified 05/30/2018  . Zoster Recombinat (Shingrix) 05/29/2018, 08/01/2018     Objective: Vital Signs: There were no vitals taken for this visit.   Physical Exam   Musculoskeletal Exam: ***  CDAI Exam: CDAI Score: - Patient Global: -; Provider Global: - Swollen: -; Tender: - Joint Exam   No joint exam has been documented for this visit   There is currently no information documented on the homunculus. Go to the Rheumatology activity and complete the homunculus joint exam.  Investigation: No additional findings.  Imaging: No results found.  Recent Labs: Lab Results  Component Value Date   WBC 8.2 10/30/2018   HGB 11.8 10/30/2018   PLT 336 10/30/2018   NA 138 10/30/2018   K 3.6 10/30/2018   CL 100 10/30/2018   CO2 30 10/30/2018   GLUCOSE 80 10/30/2018   BUN 19 10/30/2018   CREATININE 0.91 10/30/2018   BILITOT 0.5 10/30/2018   ALKPHOS 77 03/24/2017   AST 17 10/30/2018   ALT 11 10/30/2018   PROT 7.9 10/30/2018   ALBUMIN 4.0 03/24/2017   CALCIUM 9.5 10/30/2018   GFRAA 71 10/30/2018    Speciality Comments: PLQ Eye Exam: 01/04/19 WNL @ [facility] Follow up in 1 year.   Procedures:  No procedures performed Allergies: Patient has no known allergies.   Assessment / Plan:     Visit Diagnoses: No diagnosis found.  Orders: No orders of the defined types were placed in  this encounter.  No orders of the defined types were placed in this encounter.   Face-to-face time spent with patient was *** minutes. Greater than 50% of time was spent in counseling and coordination of care.  Follow-Up Instructions: No follow-ups on file.   Ofilia Neas, PA-C  Note - This record has been created using Dragon software.  Chart creation errors have been sought, but may not always  have been located. Such creation errors do not  reflect on  the standard of medical care.

## 2019-03-28 NOTE — Progress Notes (Signed)
Office Visit Note  Patient: Jessica Booker             Date of Birth: 19-Oct-1940           MRN: [id number]             PCP: Glendale Chard, MD Referring: Glendale Chard, MD Visit Date: 04/05/2019 Occupation: @GUAROCC @  Subjective:  Pain in both hands   History of Present Illness: Jessica Booker is a 78 y.o. female with history of systemic lupus erythematosus, Raynaud's syndrome, and osteoarthritis.  Patient is taking Plaquenil 200 mg 1 tablet by mouth twice daily Monday through Friday.  She states that she occasionally will miss the evening dose of Plaquenil.  She denies any signs or symptoms of a lupus flare recently.  She denies any recent rashes, sores in her mouth or nose, sicca symptoms, photosensitivity, fatigue, fevers, swollen lymph nodes, shortness of breath, or palpitations.  She does continue to get symptoms of Raynaud's intermittently.  She has occasional pain in bilateral hands but no joint swelling.   Activities of Daily Living:  Patient reports morning stiffness for 0 minutes.   Patient Denies nocturnal pain.  Difficulty dressing/grooming: Denies Difficulty climbing stairs: Denies Difficulty getting out of chair: Denies Difficulty using hands for taps, buttons, cutlery, and/or writing: Denies  Review of Systems  Constitutional: Negative for fatigue.  HENT: Negative for mouth sores, mouth dryness and nose dryness.   Eyes: Negative for pain, visual disturbance and dryness.  Respiratory: Negative for cough, hemoptysis, shortness of breath and difficulty breathing.   Cardiovascular: Negative for chest pain, palpitations, hypertension and swelling in legs/feet.  Gastrointestinal: Negative for blood in stool, constipation and diarrhea.  Endocrine: Negative for increased urination.  Genitourinary: Negative for painful urination.  Musculoskeletal: Positive for arthralgias and joint pain. Negative for joint swelling, myalgias, muscle weakness, morning stiffness, muscle  tenderness and myalgias.  Skin: Positive for color change. Negative for pallor, rash, hair loss, nodules/bumps, skin tightness, ulcers and sensitivity to sunlight.  Allergic/Immunologic: Negative for susceptible to infections.  Neurological: Negative for dizziness, numbness, headaches and weakness.  Hematological: Negative for swollen glands.  Psychiatric/Behavioral: Negative for depressed mood and sleep disturbance. The patient is not nervous/anxious.     PMFS History:  Patient Active Problem List   Diagnosis Date Noted   Essential hypertension 11/02/2018   Lupus ([place]) 11/02/2018   Primary osteoarthritis of both knees 06/06/2018   Cough variant asthma 08/25/2017   Systemic lupus erythematosus ([place]) 02/18/2017   High risk medication use 02/18/2017   Raynaud's disease without gangrene 02/18/2017   DDD (degenerative disc disease), lumbar 02/18/2017   Primary osteoarthritis of both hands 02/18/2017   Primary osteoarthritis of both feet 02/18/2017   Vitamin D deficiency 02/18/2017   History of hypertension 02/18/2017   History of neuropathy 02/18/2017    Past Medical History:  Diagnosis Date   Hypertension    Lupus ([place])     Family History  Problem Relation Age of Onset   Heart attack Mother    Stomach cancer Father    Hypertension Sister    Heart disease Sister    Past Surgical History:  Procedure Laterality Date   ABDOMINAL HYSTERECTOMY     CHOLECYSTECTOMY     SHOULDER ARTHROSCOPY W/ ROTATOR CUFF REPAIR     R shoulder   TONSILLECTOMY     Social History   Social History Narrative   Not on file   Immunization History  Administered Date(s) Administered   Influenza-Unspecified 05/30/2018  Zoster Recombinat (Shingrix) 05/29/2018, 08/01/2018     Objective: Vital Signs: BP (!) 141/66 (BP Location: Right Arm, Patient Position: Sitting, Cuff Size: Normal)    Pulse 74    Resp 12    Ht 5' 4.5" (1.638 m)    Wt 168 lb (76.2 kg)    BMI 28.39 kg/m      Physical Exam Vitals signs and nursing note reviewed.  Constitutional:      Appearance: She is well-developed.  HENT:     Head: Normocephalic and atraumatic.  Eyes:     Conjunctiva/sclera: Conjunctivae normal.  Neck:     Musculoskeletal: Normal range of motion.  Cardiovascular:     Rate and Rhythm: Normal rate and regular rhythm.     Heart sounds: Normal heart sounds.  Pulmonary:     Effort: Pulmonary effort is normal.     Breath sounds: Normal breath sounds.  Abdominal:     General: Bowel sounds are normal.     Palpations: Abdomen is soft.  Lymphadenopathy:     Cervical: No cervical adenopathy.  Skin:    General: Skin is warm and dry.     Capillary Refill: Capillary refill takes less than 2 seconds.  Neurological:     Mental Status: She is alert and oriented to person, place, and time.  Psychiatric:        Behavior: Behavior normal.      Musculoskeletal Exam: C-spine, thoracic spine, and lumbar spine good ROM.  No midline spinal tenderness.  No SI joint tenderness.  Shoulder joints, elbow joints, wrist joints, MCPs, PIPs, and DIPs good ROM with no synovitis. PIP and DIP synovial thickening consistent with osteoarthritis of both hands.  Hip joints, knee joints, ankle joints, MTPs, PIPs, and DIPs good ROM with no synovitis. No warmth or effusion of knee joints.  No tenderness or swelling of ankle joints.  Bunions noted bilaterally.  PIP and DIP synovial thickening consistent with osteoarthritis of both feet.   CDAI Exam: CDAI Score: -- Patient Global: --; Provider Global: -- Swollen: --; Tender: -- Joint Exam   No joint exam has been documented for this visit   There is currently no information documented on the homunculus. Go to the Rheumatology activity and complete the homunculus joint exam.  Investigation: No additional findings.  Imaging: Xr Hip Unilat W Or W/o Pelvis 2-3 Views Left  Result Date: 04/05/2019 No hip joint narrowing was noted.  No SI joint to  sclerosis was noted.  No chondrocalcinosis was noted.  Bowel gas was noted. Impression: Unremarkable x-ray of the hip joint.   Recent Labs: Lab Results  Component Value Date   WBC 8.2 10/30/2018   HGB 11.8 10/30/2018   PLT 336 10/30/2018   NA 138 10/30/2018   K 3.6 10/30/2018   CL 100 10/30/2018   CO2 30 10/30/2018   GLUCOSE 80 10/30/2018   BUN 19 10/30/2018   CREATININE 0.91 10/30/2018   BILITOT 0.5 10/30/2018   ALKPHOS 77 03/24/2017   AST 17 10/30/2018   ALT 11 10/30/2018   PROT 7.9 10/30/2018   ALBUMIN 4.0 03/24/2017   CALCIUM 9.5 10/30/2018   GFRAA 71 10/30/2018    Speciality Comments: PLQ Eye Exam: 01/04/19 WNL @ [facility] Follow up in 1 year.   Procedures:  No procedures performed Allergies: Patient has no known allergies.   Assessment / Plan:     Visit Diagnoses: Other systemic lupus erythematosus with other organ involvement (HCC) - -ANA, positive Smith, and positive Ro and  RNP: She has not had any signs or symptoms of a lupus flare recently.  She is clinically doing well on Plaquenil 200 mg 1 tablet by mouth twice daily Monday through Friday.  She has no synovitis on exam.  She has intermittent discomfort in both hands but no tenderness or inflammation today.  She has PIP and DIP synovial thickening consistent with osteoarthritis of bilateral hands and bilateral feet.  She has not had any recent rashes or photosensitivity.  She has intermittent symptoms of Raynaud's but no digital ulcerations or signs of gangrene were noted.  She has not had any sicca symptoms recently.  No oral lesions or ulcerations.  She has not had any fatigue, fever, or swollen lymph nodes.  She denies any palpitations or shortness of breath recently.  She will continue taking Plaquenil 200 mg 1 tablet by mouth twice daily Monday through Friday.  She does not need any refills at this time.  We will check autoimmune lab work today.  She was advised to notify us if she develops any new or  worsening symptoms.  She will follow-up in the office in 5 months.- Plan: Urinalysis, Routine w reflex microscopic, COMPLETE METABOLIC PANEL WITH GFR, CBC with Differential/Platelet, Anti-DNA antibody, double-stranded, C3 and C4, Sedimentation rate  High risk medication use - PLQ 200 mg 1 tablet by mouth twice daily M-F. eye exam: 01/04/2019.  Future orders for CBC and CMP were placed today.  She will be due for lab work in September and every 5 months to monitor for drug toxicity.- Plan: COMPLETE METABOLIC PANEL WITH GFR, CBC with Differential/Platelet  Raynaud's disease without gangrene - She has intermittent symptoms of Raynaud's.  No digital ulcerations or signs of gangrene were noted.  She was encouraged to keep her core body temperature warm.   Primary osteoarthritis of both hands - She has PIP and DIP synovial thickening consistent with osteoarthritis of both hands.  No tenderness or synovitis noted.  She has complete fist formation bilaterally.  Joint protection and muscle strengthening were discussed.   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  No warmth or effusion of knee joints noted.  She has no difficulty getting up from a chair or climbing steps.   Primary osteoarthritis of both feet - She has bunions bilaterally. She has PIP and DIP synovial thickening consistent with osteoarthritis of both feet.  She wears proper fitting shoes.   DDD (degenerative disc disease), lumbar -She has good ROM with no discomfort.  No midline spinal tenderness noted.   Pain in left hip: She is good range of motion of the left hip joint on exam.  She has been experiencing some left lower quadrant discomfort intermittently over the past several years.  She has tenderness in the left lower quadrant today on exam.  She denied any constipation or blood in her stool recently.  She is been experiencing some radiating pain into her groin.  We  will obtain an x-ray of the left hip.  Will refer her to GI for further evaluation.  Chronic LLQ pain: She has LLQ tenderness on exam.  She has intemrittent  Other medical conditions are listed as follows:   History of neuropathy   Vitamin D deficiency   History of hypertension  Orders: Orders Placed This Encounter  Procedures   XR HIP UNILAT W OR W/O PELVIS 2-3 VIEWS LEFT   Urinalysis, Routine w reflex microscopic   COMPLETE  METABOLIC PANEL WITH GFR   CBC with Differential/Platelet   Anti-DNA antibody, double-stranded   C3 and C4   Sedimentation rate   Ambulatory referral to Gastroenterology   No orders of the defined types were placed in this encounter.   Face-to-face time spent with patient was 30 minutes.  Patient's autoimmune disease is fairly well controlled.  She has been having discomfort in the left lower quadrant.  Hip joint was in good range of motion with minimal discomfort.  The x-ray obtained today was unremarkable.  We will refer her to GI as per her request.  Greater than 50% of time was spent in counseling and coordination of care.  Follow-Up Instructions: Return in about 5 months (around 09/05/2019) for Systemic lupus erythematosus.   Pollyann SavoyShaili Deveshwar, MD  Note - This record has been created using Animal [employer] software.  Chart creation errors have been sought, but may not always  have been located. Such creation errors do not reflect on  the standard of medical care.

## 2019-04-02 ENCOUNTER — Ambulatory Visit: Payer: Self-pay | Admitting: Physician Assistant

## 2019-04-05 ENCOUNTER — Ambulatory Visit: Payer: Self-pay

## 2019-04-05 ENCOUNTER — Encounter: Payer: Self-pay | Admitting: Rheumatology

## 2019-04-05 ENCOUNTER — Other Ambulatory Visit: Payer: Self-pay

## 2019-04-05 ENCOUNTER — Ambulatory Visit (INDEPENDENT_AMBULATORY_CARE_PROVIDER_SITE_OTHER): Payer: Medicare Other | Admitting: Rheumatology

## 2019-04-05 VITALS — BP 141/66 | HR 74 | Resp 12 | Ht 64.5 in | Wt 168.0 lb

## 2019-04-05 DIAGNOSIS — Z79899 Other long term (current) drug therapy: Secondary | ICD-10-CM

## 2019-04-05 DIAGNOSIS — I7300 Raynaud's syndrome without gangrene: Secondary | ICD-10-CM | POA: Diagnosis not present

## 2019-04-05 DIAGNOSIS — Z8679 Personal history of other diseases of the circulatory system: Secondary | ICD-10-CM

## 2019-04-05 DIAGNOSIS — M3219 Other organ or system involvement in systemic lupus erythematosus: Secondary | ICD-10-CM

## 2019-04-05 DIAGNOSIS — M25552 Pain in left hip: Secondary | ICD-10-CM | POA: Diagnosis not present

## 2019-04-05 DIAGNOSIS — R1032 Left lower quadrant pain: Secondary | ICD-10-CM

## 2019-04-05 DIAGNOSIS — M19042 Primary osteoarthritis, left hand: Secondary | ICD-10-CM

## 2019-04-05 DIAGNOSIS — M5136 Other intervertebral disc degeneration, lumbar region: Secondary | ICD-10-CM

## 2019-04-05 DIAGNOSIS — M19041 Primary osteoarthritis, right hand: Secondary | ICD-10-CM | POA: Diagnosis not present

## 2019-04-05 DIAGNOSIS — M19071 Primary osteoarthritis, right ankle and foot: Secondary | ICD-10-CM

## 2019-04-05 DIAGNOSIS — M170 Bilateral primary osteoarthritis of knee: Secondary | ICD-10-CM

## 2019-04-05 DIAGNOSIS — G8929 Other chronic pain: Secondary | ICD-10-CM

## 2019-04-05 DIAGNOSIS — M19072 Primary osteoarthritis, left ankle and foot: Secondary | ICD-10-CM

## 2019-04-05 DIAGNOSIS — M51369 Other intervertebral disc degeneration, lumbar region without mention of lumbar back pain or lower extremity pain: Secondary | ICD-10-CM

## 2019-04-05 DIAGNOSIS — E559 Vitamin D deficiency, unspecified: Secondary | ICD-10-CM

## 2019-04-05 DIAGNOSIS — Z8669 Personal history of other diseases of the nervous system and sense organs: Secondary | ICD-10-CM

## 2019-04-05 NOTE — Patient Instructions (Signed)
Standing Labs We placed an order today for your standing lab work.    Please come back and get your standing labs in September   We have open lab daily Monday through Thursday from 8:30-12:30 PM and 1:30-4:30 PM and Friday from 8:30-12:30 PM and 1:30 -4:00 PM at the office of Dr. Shaili Deveshwar.   You may experience shorter wait times on Monday and Friday afternoons. The office is located at [address], [address], [place], [place] [address] No appointment is necessary.   Labs are drawn by [facility].  You may receive a bill from [facility] for your lab work.  If you wish to have your labs drawn at another location, please call the office 24 hours in advance to send orders.  If you have any questions regarding directions or hours of operation,  please call [phone].   Just as a reminder please drink plenty of water prior to coming for your lab work. Thanks!   

## 2019-04-06 ENCOUNTER — Other Ambulatory Visit: Payer: Self-pay | Admitting: Internal Medicine

## 2019-04-06 ENCOUNTER — Telehealth: Payer: Self-pay | Admitting: Rheumatology

## 2019-04-06 LAB — URINALYSIS, ROUTINE W REFLEX MICROSCOPIC
Bilirubin Urine: NEGATIVE
Glucose, UA: NEGATIVE
Hgb urine dipstick: NEGATIVE
Nitrite: NEGATIVE
Protein, ur: NEGATIVE
RBC / HPF: NONE SEEN /HPF (ref 0–2)
Specific Gravity, Urine: 1.018 (ref 1.001–1.03)
pH: 5.5 (ref 5.0–8.0)

## 2019-04-06 LAB — COMPLETE METABOLIC PANEL WITH GFR
AG Ratio: 1.1 (calc) (ref 1.0–2.5)
ALT: 10 U/L (ref 6–29)
AST: 17 U/L (ref 10–35)
Albumin: 4.2 g/dL (ref 3.6–5.1)
Alkaline phosphatase (APISO): 76 U/L (ref 37–153)
BUN: 19 mg/dL (ref 7–25)
CO2: 29 mmol/L (ref 20–32)
Calcium: 10.3 mg/dL (ref 8.6–10.4)
Chloride: 102 mmol/L (ref 98–110)
Creat: 0.81 mg/dL (ref 0.60–0.93)
GFR, Est African American: 81 mL/min/{1.73_m2} (ref >=60)
GFR, Est Non African American: 70 mL/min/{1.73_m2} (ref >=60)
Globulin: 3.7 g/dL (calc) (ref 1.9–3.7)
Glucose, Bld: 80 mg/dL (ref 65–99)
Potassium: 3.7 mmol/L (ref 3.5–5.3)
Sodium: 140 mmol/L (ref 135–146)
Total Bilirubin: 0.5 mg/dL (ref 0.2–1.2)
Total Protein: 7.9 g/dL (ref 6.1–8.1)

## 2019-04-06 LAB — CBC WITH DIFFERENTIAL/PLATELET
Absolute Monocytes: 497 cells/uL (ref 200–950)
Basophils Absolute: 21 cells/uL (ref 0–200)
Basophils Relative: 0.3 %
Eosinophils Absolute: 83 cells/uL (ref 15–500)
Eosinophils Relative: 1.2 %
HCT: 37.1 % (ref 35.0–45.0)
Hemoglobin: 12.1 g/dL (ref 11.7–15.5)
Lymphs Abs: 2284 cells/uL (ref 850–3900)
MCH: 26.5 pg — ABNORMAL LOW (ref 27.0–33.0)
MCHC: 32.6 g/dL (ref 32.0–36.0)
MCV: 81.2 fL (ref 80.0–100.0)
MPV: 10.7 fL (ref 7.5–12.5)
Monocytes Relative: 7.2 %
Neutro Abs: 4016 cells/uL (ref 1500–7800)
Neutrophils Relative %: 58.2 %
Platelets: 317 10*3/uL (ref 140–400)
RBC: 4.57 10*6/uL (ref 3.80–5.10)
RDW: 13 % (ref 11.0–15.0)
Total Lymphocyte: 33.1 %
WBC: 6.9 10*3/uL (ref 3.8–10.8)

## 2019-04-06 LAB — C3 AND C4
C3 Complement: 145 mg/dL (ref 83–193)
C4 Complement: 32 mg/dL (ref 15–57)

## 2019-04-06 LAB — SEDIMENTATION RATE: Sed Rate: 31 mm/h — ABNORMAL HIGH (ref 0–30)

## 2019-04-06 LAB — ANTI-DNA ANTIBODY, DOUBLE-STRANDED: ds DNA Ab: 4 IU/mL

## 2019-04-06 NOTE — Progress Notes (Signed)
Sed rate is borderline elevated but stable-31.  Complements WNL.  DsDNA is 4.  No signs of a flare.  CBC and CMP WNL.  UA reveals findings consistent with possible UTI.  Please notify patient and advise her to further discuss with PCP

## 2019-04-06 NOTE — Telephone Encounter (Signed)
Patient states she was in the office on 04/05/19 and a referral was placed for GI. Patient wanted to know if we would be handling it. Advised patient that we have someone who sends the referrals to the office we are referring to and then someone from there office or ours will be in touch with an appointment.

## 2019-04-06 NOTE — Telephone Encounter (Signed)
Patient left a voicemail stating she has questions regarding her referral to Gastroenterology.   Patient requested a return call.

## 2019-04-11 ENCOUNTER — Ambulatory Visit (INDEPENDENT_AMBULATORY_CARE_PROVIDER_SITE_OTHER): Payer: Medicare Other | Admitting: Nurse Practitioner

## 2019-04-11 ENCOUNTER — Encounter: Payer: Self-pay | Admitting: Nurse Practitioner

## 2019-04-11 ENCOUNTER — Ambulatory Visit
Admission: RE | Admit: 2019-04-11 | Discharge: 2019-04-11 | Disposition: A | Discharge status: Home / Self Care | Payer: Medicare Other | Source: Ambulatory Visit | Attending: Nurse Practitioner | Admitting: Nurse Practitioner

## 2019-04-11 ENCOUNTER — Other Ambulatory Visit: Payer: Self-pay | Admitting: Nurse Practitioner

## 2019-04-11 ENCOUNTER — Other Ambulatory Visit: Payer: Self-pay

## 2019-04-11 VITALS — BP 130/62 | HR 90 | Temp 98.5°F | Ht 61.6 in | Wt 165.6 lb

## 2019-04-11 DIAGNOSIS — R142 Eructation: Secondary | ICD-10-CM

## 2019-04-11 DIAGNOSIS — K5900 Constipation, unspecified: Secondary | ICD-10-CM | POA: Insufficient documentation

## 2019-04-11 DIAGNOSIS — I10 Essential (primary) hypertension: Secondary | ICD-10-CM

## 2019-04-11 DIAGNOSIS — R0600 Dyspnea, unspecified: Secondary | ICD-10-CM | POA: Diagnosis not present

## 2019-04-11 DIAGNOSIS — R8290 Unspecified abnormal findings in urine: Secondary | ICD-10-CM

## 2019-04-11 MED ORDER — NITROFURANTOIN MACROCRYSTAL 100 MG PO CAPS: 100.0000 mg | ORAL_CAPSULE | Freq: Two times a day (BID) | ORAL | 0 refills | Status: DC

## 2019-04-11 MED ORDER — NITROFURANTOIN MONOHYD MACRO 100 MG PO CAPS: 100.0000 mg | ORAL_CAPSULE | Freq: Two times a day (BID) | ORAL | 0 refills | Status: AC

## 2019-04-11 MED ORDER — ALBUTEROL SULFATE HFA 108 (90 BASE) MCG/ACT IN AERS: 1.0000 | INHALATION_SPRAY | Freq: Four times a day (QID) | RESPIRATORY_TRACT | 3 refills | Status: DC | PRN

## 2019-04-11 MED ORDER — POLYETHYLENE GLYCOL 3350 17 G PO PACK: 17.0000 g | PACK | Freq: Every day | ORAL | 0 refills | Status: DC

## 2019-04-11 NOTE — Telephone Encounter (Signed)
Change requested.

## 2019-04-11 NOTE — Progress Notes (Signed)
Subjective:     Patient ID: Jessica Booker , female    DOB: [birth year] , 78 y.o.   MRN: [id number]   Chief Complaint  Patient presents with  . Hypertension  . BELCHING    HPI  Belching - she has been having increased   She seen Dr. Bronson Curb last week.  She had an xray of her hips.  Xray revealed she has bowel gas present.  She is also reporting being out of breath when she belches. She is taking a probiotic and gas relief.    Hypertension This is a new problem. Pertinent negatives include no chest pain, headaches or palpitations.     Past Medical History:  Diagnosis Date  . Hypertension   . Lupus ([place])      Family History  Problem Relation Age of Onset  . Heart attack Mother   . Stomach cancer Father   . Hypertension Sister   . Heart disease Sister      Current Outpatient Medications:  .  aspirin 81 MG tablet, Take 81 mg by mouth daily. Takes as needed, Disp: , Rfl:  .  budesonide-formoterol (SYMBICORT) 80-4.5 MCG/ACT inhaler, Take 2 puffs first thing in am and then another 2 puffs about 12 hours later. (Patient taking differently: as needed. Take 2 puffs first thing in am and then another 2 puffs about 12 hours later.), Disp: 1 Inhaler, Rfl: 111 .  cholecalciferol (VITAMIN D3) 25 MCG (1000 UT) tablet, Take 1,000 Units by mouth as needed. , Disp: , Rfl:  .  diclofenac sodium (VOLTAREN) 1 % GEL, Apply 2 g to 4 g to affected joint up to 4 times daily PRN., Disp: 4 Tube, Rfl: 2 .  hydrochlorothiazide (HYDRODIURIL) 25 MG tablet, TAKE 1 TABLET BY MOUTH EVERY DAY, Disp: 90 tablet, Rfl: 2 .  hydroxychloroquine (PLAQUENIL) 200 MG tablet, TAKE 1 TABLET BY MOUTH TWICE A DAY MONDAY THRU FRIDAY, Disp: 120 tablet, Rfl: 0 .  NIFEdipine (ADALAT CC) 60 MG 24 hr tablet, TAKE 1 TABLET BY MOUTH EVERY DAY, Disp: 30 tablet, Rfl: 0 .  albuterol (PROVENTIL HFA;VENTOLIN HFA) 108 (90 BASE) MCG/ACT inhaler, Inhale 1-2 puffs into the lungs every 6 (six) hours as needed for wheezing. (Patient not  taking: Reported on 04/11/2019), Disp: 1 Inhaler, Rfl: 0   No Known Allergies   Review of Systems  Constitutional: Negative.   Respiratory: Negative.   Cardiovascular: Negative.  Negative for chest pain, palpitations and leg swelling.  Neurological: Negative for dizziness and headaches.     Today's Vitals   04/11/19 1207  BP: 130/62  Pulse: 90  Temp: 98.5 F (36.9 C)  TempSrc: Oral  Weight: 165 lb 9.6 oz (75.1 kg)  Height: 5' 1.6" (1.565 m)  PainSc: 0-No pain   Body mass index is 30.68 kg/m.   Objective:  Physical Exam Constitutional:      Appearance: Normal appearance.  Cardiovascular:     Rate and Rhythm: Normal rate and regular rhythm.     Pulses: Normal pulses.     Heart sounds: Normal heart sounds. No murmur.  Pulmonary:     Effort: Pulmonary effort is normal. No respiratory distress.     Breath sounds: Normal breath sounds.  Skin:    General: Skin is warm and dry.     Capillary Refill: Capillary refill takes less than 2 seconds.  Neurological:     General: No focal deficit present.     Mental Status: She is alert and oriented to person, place,  and time.  Psychiatric:        Mood and Affect: Mood normal.        Behavior: Behavior normal.        Thought Content: Thought content normal.        Judgment: Judgment normal.         Assessment And Plan:     1. Essential hypertension Chronic, good control Continue with current medications Will check kidney functions - CMP14 + Anion Gap  2. Dyspnea, unspecified type Intermittent episodes of dyspnea when she is having belching episodes Will send for chest xray to evaluate structural damage No abnormal findings on physical exam She has a previous history of wheezing I will refill her albuterol - DG Chest 2 View; Future  3. Belching Continue with probiotic Will check for H Pylori, pending results will consider treating or refer to GI  - CBC no Diff - CMP14 + Anion Gap - H Pylori, IGM, IGG, IGA AB        Jessica FeltsJanece Emonee Winkowski, FNP    THE PATIENT IS ENCOURAGED TO PRACTICE SOCIAL DISTANCING DUE TO THE COVID-19 PANDEMIC.

## 2019-04-12 LAB — URINE CULTURE

## 2019-04-13 LAB — CBC
Hematocrit: 38.2 % (ref 34.0–46.6)
Hemoglobin: 12.4 g/dL (ref 11.1–15.9)
MCH: 26.8 pg (ref 26.6–33.0)
MCHC: 32.5 g/dL (ref 31.5–35.7)
MCV: 83 fL (ref 79–97)
Platelets: 305 10*3/uL (ref 150–450)
RBC: 4.62 x10E6/uL (ref 3.77–5.28)
RDW: 13.4 % (ref 11.7–15.4)
WBC: 6.9 10*3/uL (ref 3.4–10.8)

## 2019-04-13 LAB — H PYLORI, IGM, IGG, IGA AB
H pylori, IgM Abs: <9 units (ref 0.0–8.9)
H. pylori, IgA Abs: <9 units (ref 0.0–8.9)
H. pylori, IgG AbS: 1.1 Index Value — ABNORMAL HIGH (ref 0.00–0.79)

## 2019-04-13 LAB — CMP14 + ANION GAP
ALT: 10 IU/L (ref 0–32)
AST: 19 IU/L (ref 0–40)
Albumin/Globulin Ratio: 1.3 (ref 1.2–2.2)
Albumin: 4.5 g/dL (ref 3.7–4.7)
Alkaline Phosphatase: 94 IU/L (ref 39–117)
Anion Gap: 16 mmol/L (ref 10.0–18.0)
BUN/Creatinine Ratio: 16 (ref 12–28)
BUN: 17 mg/dL (ref 8–27)
Bilirubin Total: 0.4 mg/dL (ref 0.0–1.2)
CO2: 22 mmol/L (ref 20–29)
Calcium: 10.5 mg/dL — ABNORMAL HIGH (ref 8.7–10.3)
Chloride: 102 mmol/L (ref 96–106)
Creatinine, Ser: 1.04 mg/dL — ABNORMAL HIGH (ref 0.57–1.00)
GFR calc Af Amer: 59 mL/min/{1.73_m2} — ABNORMAL LOW (ref >59)
GFR calc non Af Amer: 52 mL/min/{1.73_m2} — ABNORMAL LOW (ref >59)
Globulin, Total: 3.6 g/dL (ref 1.5–4.5)
Glucose: 89 mg/dL (ref 65–99)
Potassium: 3.6 mmol/L (ref 3.5–5.2)
Sodium: 140 mmol/L (ref 134–144)
Total Protein: 8.1 g/dL (ref 6.0–8.5)

## 2019-04-16 ENCOUNTER — Other Ambulatory Visit: Payer: Self-pay | Admitting: Internal Medicine

## 2019-04-16 DIAGNOSIS — Z1231 Encounter for screening mammogram for malignant neoplasm of breast: Secondary | ICD-10-CM

## 2019-04-17 ENCOUNTER — Ambulatory Visit
Admission: RE | Admit: 2019-04-17 | Discharge: 2019-04-17 | Disposition: A | Discharge status: Home / Self Care | Payer: Medicare Other | Source: Ambulatory Visit | Attending: Internal Medicine | Admitting: Internal Medicine

## 2019-04-17 ENCOUNTER — Other Ambulatory Visit: Payer: Self-pay

## 2019-04-17 DIAGNOSIS — Z1231 Encounter for screening mammogram for malignant neoplasm of breast: Secondary | ICD-10-CM

## 2019-04-20 ENCOUNTER — Encounter: Payer: Self-pay | Admitting: Cardiology

## 2019-04-20 ENCOUNTER — Other Ambulatory Visit: Payer: Self-pay

## 2019-04-20 ENCOUNTER — Ambulatory Visit (INDEPENDENT_AMBULATORY_CARE_PROVIDER_SITE_OTHER): Payer: Medicare Other | Admitting: Cardiology

## 2019-04-20 VITALS — BP 140/62 | HR 77 | Ht 64.0 in | Wt 162.0 lb

## 2019-04-20 DIAGNOSIS — I10 Essential (primary) hypertension: Secondary | ICD-10-CM

## 2019-04-20 DIAGNOSIS — Z8639 Personal history of other endocrine, nutritional and metabolic disease: Secondary | ICD-10-CM

## 2019-04-20 DIAGNOSIS — R0600 Dyspnea, unspecified: Secondary | ICD-10-CM

## 2019-04-20 DIAGNOSIS — R0789 Other chest pain: Secondary | ICD-10-CM

## 2019-04-20 DIAGNOSIS — R0609 Other forms of dyspnea: Secondary | ICD-10-CM

## 2019-04-20 MED ORDER — LOSARTAN POTASSIUM 25 MG PO TABS: 25.0000 mg | ORAL_TABLET | Freq: Every day | ORAL | 1 refills | Status: DC

## 2019-04-20 NOTE — Progress Notes (Signed)
Primary Physician:  Glendale Chard, MD   Patient ID: Jessica Booker, female    DOB: 03-28-1941, 79 y.o.   MRN: [id number]  Subjective:    Chief Complaint  Patient presents with  . Shortness of Breath  . Follow-up    HPI: Jessica Booker  is a 78 y.o. female  with  lupus arthritis, hypertension, moderate obesity, hyperglycemia last seen by [place] in 2019 for chest heaviness. She underwent exercise nuclear stress test in April 2019 that was considered low risk study. Echo was also performed at that time showing grade 2 diastolic dysfunction, normal LVEF, mild aortic stenosis with mild to mod aortic regurgitation. She was supposed to come back to see [place]; however, was lost to follow up.   She had initially had improvement in chest heaviness; however, has now recurred and is also having shortness of breath. Heaviness is with emotional upset or with eating. No pain radiation. She does notice with changing positions of her neck or rubbing on her shoulder this helps her symptoms. She is also having stomach pain, and is going to GI doctor next week.   She is walking regularly and does not have any symptoms of chest pain. She does have dyspnea on exertion that is stable, activity is not limited by this. No PND or orthopnea. Denies leg edema. She reports that she is under significant stress with caring for her husband who is disabled. She feels a lot pressure regarding this.   Past Medical History:  Diagnosis Date  . Hypertension   . Lupus [facility])     Past Surgical History:  Procedure Laterality Date  . ABDOMINAL HYSTERECTOMY    . CHOLECYSTECTOMY    . SHOULDER ARTHROSCOPY W/ ROTATOR CUFF REPAIR     R shoulder  . TONSILLECTOMY      Social History   Socioeconomic History  . Marital status: Married    Spouse name: Not on file  . Number of children: 2  . Years of education: Not on file  . Highest education level: Not on file  Occupational History  . Occupation: retired  Scientific laboratory technician   . Financial resource strain: Not hard at all  . Food insecurity    Worry: Never true    Inability: Never true  . Transportation needs    Medical: No    Non-medical: No  Tobacco Use  . Smoking status: Never Smoker  . Smokeless tobacco: Never Used  Substance and Sexual Activity  . Alcohol use: No  . Drug use: No  . Sexual activity: Yes    Birth control/protection: Surgical  Lifestyle  . Physical activity    Days per week: 0 days    Minutes per session: 0 min  . Stress: Not at all  Relationships  . Social Herbalist on phone: Not on file    Gets together: Not on file    Attends religious service: Not on file    Active member of club or organization: Not on file    Attends meetings of clubs or organizations: Not on file    Relationship status: Not on file  . Intimate partner violence    Fear of current or ex partner: No    Emotionally abused: No    Physically abused: No    Forced sexual activity: No  Other Topics Concern  . Not on file  Social History Narrative  . Not on file    Review of Systems  Constitution: Negative for decreased  appetite, malaise/fatigue, weight gain and weight loss.  Eyes: Negative for visual disturbance.  Cardiovascular: Positive for dyspnea on exertion. Negative for chest pain, claudication, leg swelling, orthopnea, palpitations and syncope.  Respiratory: Negative for hemoptysis and wheezing.   Endocrine: Negative for cold intolerance and heat intolerance.  Hematologic/Lymphatic: Does not bruise/bleed easily.  Skin: Negative for nail changes.  Musculoskeletal: Positive for back pain, joint pain and neck pain. Negative for muscle weakness and myalgias.  Gastrointestinal: Positive for heartburn. Negative for abdominal pain, change in bowel habit, nausea and vomiting.  Neurological: Negative for difficulty with concentration, dizziness, focal weakness and headaches.  Psychiatric/Behavioral: Negative for altered mental status and suicidal  ideas.  All other systems reviewed and are negative.     Objective:  Blood pressure 140/62, pulse 77, height 5\' 4"  (1.626 m), weight 162 lb (73.5 kg), SpO2 93 %. Body mass index is 27.81 kg/m.    Physical Exam  Constitutional: She is oriented to person, place, and time. Vital signs are normal. She appears well-developed and well-nourished.  HENT:  Head: Normocephalic and atraumatic.  Neck: Normal range of motion.  Cardiovascular: Normal rate, regular rhythm, normal heart sounds and intact distal pulses.  Pulses:      Carotid pulses are on the right side with bruit and on the left side with bruit. Pulmonary/Chest: Effort normal and breath sounds normal. No accessory muscle usage. No respiratory distress.  Abdominal: Soft. Bowel sounds are normal.  Musculoskeletal: Normal range of motion.  Neurological: She is alert and oriented to person, place, and time.  Skin: Skin is warm and dry.  Vitals reviewed.  Radiology: No results found.  Laboratory examination:    CMP Latest Ref Rng & Units 04/23/2019 04/11/2019 04/05/2019  Glucose 65 - 99 mg/dL 93 89 80  BUN 8 - 27 mg/dL 21 17 19   Creatinine 0.57 - 1.00 mg/dL 1.610.98 0.96(E1.04(H) 4.540.81  Sodium 134 - 144 mmol/L 142 140 140  Potassium 3.5 - 5.2 mmol/L 3.9 3.6 3.7  Chloride 96 - 106 mmol/L 104 102 102  CO2 20 - 29 mmol/L 24 22 29   Calcium 8.7 - 10.3 mg/dL 9.8 10.5(H) 10.3  Total Protein 6.0 - 8.5 g/dL - 8.1 7.9  Total Bilirubin 0.0 - 1.2 mg/dL - 0.4 0.5  Alkaline Phos 39 - 117 IU/L - 94 -  AST 0 - 40 IU/L - 19 17  ALT 0 - 32 IU/L - 10 10   CBC Latest Ref Rng & Units 04/11/2019 04/05/2019 10/30/2018  WBC 3.4 - 10.8 x10E3/uL 6.9 6.9 8.2  Hemoglobin 11.1 - 15.9 g/dL 09.812.4 11.912.1 14.711.8  Hematocrit 34.0 - 46.6 % 38.2 37.1 36.8  Platelets 150 - 450 x10E3/uL 305 317 336   Lipid Panel     Component Value Date/Time   CHOL 149 04/23/2019 0906   TRIG 44 04/23/2019 0906   HDL 60 04/23/2019 0906   CHOLHDL 2.5 04/23/2019 0906   LDLCALC 80 04/23/2019 0906    HEMOGLOBIN A1C No results found for: HGBA1C, MPG TSH No results for input(s): TSH in the last 8760 hours.  PRN Meds:. There are no discontinued medications. Current Meds  Medication Sig  . albuterol (VENTOLIN HFA) 108 (90 Base) MCG/ACT inhaler Inhale 1-2 puffs into the lungs every 6 (six) hours as needed for wheezing.  Marland Kitchen. aspirin 81 MG tablet Take 81 mg by mouth daily. Takes as needed  . budesonide-formoterol (SYMBICORT) 80-4.5 MCG/ACT inhaler Take 2 puffs first thing in am and then another 2 puffs about 12 hours  later. (Patient taking differently: as needed. Take 2 puffs first thing in am and then another 2 puffs about 12 hours later.)  . diclofenac sodium (VOLTAREN) 1 % GEL Apply 2 g to 4 g to affected joint up to 4 times daily PRN.  . hydrochlorothiazide (HYDRODIURIL) 25 MG tablet TAKE 1 TABLET BY MOUTH EVERY DAY  . hydroxychloroquine (PLAQUENIL) 200 MG tablet TAKE 1 TABLET BY MOUTH TWICE A DAY MONDAY THRU FRIDAY  . NIFEdipine (ADALAT CC) 60 MG 24 hr tablet TAKE 1 TABLET BY MOUTH EVERY DAY  . polyethylene glycol (MIRALAX / GLYCOLAX) 17 g packet Take 17 g by mouth daily.    Cardiac Studies:   Echocardiogram 12/14/2017: Left ventricle cavity is normal in size. Mild concentric hypertrophy of the left ventricle. Normal global wall motion. Doppler evidence of grade II (pseudonormal) diastolic dysfunction, elevated LAP. Calculated EF 55%. Left atrial cavity is moderately dilated. Mild aortic valve leaflet calcification. Mild aortic valve stenosis. Aortic valve mean gradient of 11 mmHg, Vmax of 2.2 m/s. Calculated aortic valve area by continuity equation is 1.4 cm. Mild to moderate aortic regurgitation. Mild (Grade I) mitral regurgitation. Mild tricuspid regurgitation. Estimated pulmonary artery systolic pressure 23 mmHg. Compared to previous echocardiogram in 2013, aortic stenosis and diastolic dysfunction is new. Interval increase in aortic regurgitation severity.  Exercise myoview  stress 12/02/2017: 1. The patient performed treadmill exercise using a Bruce protocol, completing 5 minutes. The patient completed an estimated workload of 7.03 METS, reaching 97% of the maximum predicted heart rate. Resting hypertension with exaggerated exercise response. Peak BP 248/48 mmHg. No stress symptoms reported. Fair exercise capacity. No ischemic changes seen on stress electrocardiogram. Frequent PVC's seen at rest, and during exercise. 2. The overall quality of the study is excellent. There is no evidence of abnormal lung activity. Stress and rest SPECT images demonstrate homogeneous tracer distribution throughout the myocardium. Gated SPECT imaging reveals normal myocardial thickening and wall motion. The left ventricular ejection fraction was normal (72%).  3. Low risk study.  Carotid artery duplex 12/13/2017: No hemodynamically significant arterial disease in the internal carotid artery bilaterally. There He is heterogeneous plaque noted in the right external carotid and carotid bifurcations with less than 50%( minimal) stenosis. Further studies if clinically indicated. No significant change from 07/03/2014.  Assessment:   Essential hypertension - Plan: EKG 12-Lead, Lipid Profile, Lipid Profile, Basic metabolic panel, Basic metabolic panel  Atypical chest pain  Dyspnea on exertion - Plan: EKG 12-Lead, PCV ECHOCARDIOGRAM COMPLETE  History of vitamin D deficiency - Plan: VITAMIN D 25 Hydroxy (Vit-D Deficiency, Fractures)  EKG 04/20/2019: Normal sinus rhythm at rate of 79 bpm, normal axis, poor R-wave progression, probably normal variant. No evidence of ischemia.   Recommendations:   Patient was last seen in April 2019 for chest heaviness and dyspnea on exertion, but was lost to follow-up.  She initially had improvement in symptoms after seen by us 1 year ago; however, has recently had again worsening symptoms.  Symptoms are associated with emotional upset or after eating.  She  is under significant amount of stress in caring for her disabled husband and I suspect this may be contributing to her symptoms.  Also question if she is also having some symptoms of GERD that could explain her chest tightness and shortness of breath.  I have discussed stress test results from 1 year ago with the patient, considered low risk study, she was noted to be markedly hypertensive.  Her blood pressure is borderline elevated today.  I discussed echocardiogram results as well, she does have grade 2 diastolic dysfunction and mild LVH likely related to her hypertension.  Compared to her echocardiogram in 2013, has mild aortic stenosis and now mild to moderate aortic regurgitation.  She is on nifedipine, would recommend continuing the same.  Given the findings on her echocardiogram, I recommended aggressive blood pressure control, will start losartan 25 mg daily.  Will check BMP in 2 weeks as well as a lipid panel as this does not appear to have been performed recently.  She was to be evaluated by GI next week.  Given her worsening symptoms we will also repeat her echocardiogram for surveillance of valvular disease.  No clinical evidence of heart failure by clinical exam.  I will see her back in 4 weeks to follow-up on hypertension and discuss echocardiogram results.  Toniann FailAshton Haynes Josey Forcier, MSN, APRN, FNP-C [facility] Office: [phone] Fax: [phone]

## 2019-04-23 ENCOUNTER — Telehealth: Payer: Self-pay

## 2019-04-23 NOTE — Telephone Encounter (Signed)
-----   Message from Minette Brine, [place] sent at 04/19/2019  5:46 PM EDT ----- Regarding: Labs The lab to test for infection in stomach is negative for active infection but does show she has had an infection in her stomach in the past, has she ever been treated for H. Pylori in the past?  If not we can treat with antibiotics to see if this helps with her belching/abdomen discomfort. Would you like to be treated with antibiotics to see if this helps your symptoms.  Your kidney functions are declined as well, this can come from being dehydrated.  Also make sure you are not taking any ibuprofen/aleve.   Kindest Regards,   Minette Brine, DNP, FNP-BC

## 2019-04-23 NOTE — Telephone Encounter (Signed)
I spoke with patient and she stated her bone doctor set her up with a GI specialist and she is going to see them next week. She stated would like to wait on the antibiotics to see what the GI doctor says. YRL,RMA

## 2019-04-24 LAB — BASIC METABOLIC PANEL
BUN/Creatinine Ratio: 21 (ref 12–28)
BUN: 21 mg/dL (ref 8–27)
CO2: 24 mmol/L (ref 20–29)
Calcium: 9.8 mg/dL (ref 8.7–10.3)
Chloride: 104 mmol/L (ref 96–106)
Creatinine, Ser: 0.98 mg/dL (ref 0.57–1.00)
GFR calc Af Amer: 64 mL/min/{1.73_m2} (ref >59)
GFR calc non Af Amer: 55 mL/min/{1.73_m2} — ABNORMAL LOW (ref >59)
Glucose: 93 mg/dL (ref 65–99)
Potassium: 3.9 mmol/L (ref 3.5–5.2)
Sodium: 142 mmol/L (ref 134–144)

## 2019-04-24 LAB — LIPID PANEL
Chol/HDL Ratio: 2.5 ratio (ref 0.0–4.4)
Cholesterol, Total: 149 mg/dL (ref 100–199)
HDL: 60 mg/dL (ref >39)
LDL Calculated: 80 mg/dL (ref 0–99)
Triglycerides: 44 mg/dL (ref 0–149)
VLDL Cholesterol Cal: 9 mg/dL (ref 5–40)

## 2019-04-24 NOTE — Progress Notes (Signed)
Kidney function within normal limits. Lipids are well controlled.

## 2019-04-27 NOTE — Progress Notes (Signed)
S/w pt husband gave him blood work results

## 2019-05-01 ENCOUNTER — Ambulatory Visit (INDEPENDENT_AMBULATORY_CARE_PROVIDER_SITE_OTHER): Payer: Medicare Other | Admitting: Physician Assistant

## 2019-05-01 ENCOUNTER — Encounter: Payer: Self-pay | Admitting: Physician Assistant

## 2019-05-01 VITALS — BP 128/64 | HR 56 | Temp 98.2°F | Ht 64.0 in | Wt 170.1 lb

## 2019-05-01 DIAGNOSIS — K219 Gastro-esophageal reflux disease without esophagitis: Secondary | ICD-10-CM | POA: Diagnosis not present

## 2019-05-01 DIAGNOSIS — R1030 Lower abdominal pain, unspecified: Secondary | ICD-10-CM | POA: Diagnosis not present

## 2019-05-01 DIAGNOSIS — R1013 Epigastric pain: Secondary | ICD-10-CM

## 2019-05-01 DIAGNOSIS — K5900 Constipation, unspecified: Secondary | ICD-10-CM

## 2019-05-01 MED ORDER — OMEPRAZOLE 40 MG PO CPDR: 40.0000 mg | DELAYED_RELEASE_CAPSULE | Freq: Every day | ORAL | 3 refills | Status: DC

## 2019-05-01 NOTE — Patient Instructions (Addendum)
We have given you a GERD handout.   We have sent the following medications to your pharmacy for you to pick up at your convenience:  Omeprazole 40 mg 20-30 minutes before breakfast   A high fiber diet with plenty of fluids (up to 8 glasses of water daily) is suggested to relieve these symptoms.  Citrucel, 1 tablespoon once or twice daily can be used to keep bowels regular if needed.  We have given you a high fiber diet handout. Please strive to have 25-30 grams of fiber daily.   Take your Miralax daily.

## 2019-05-01 NOTE — Progress Notes (Signed)
Chief Complaint: Epigastric pain, lower abdominal pain, constipation and GERD  HPI:    Jessica Booker is a 78 year old African-American female with past medical history as listed below including lupus, previously known to Dr. Maurene Capes, who was referred to me by Ofilia Neas, PA-C for a complaint of epigastric pain, lower abdominal pain, constipation and GERD.      Today, the patient presents to clinic and explains that her lupus doctor sent her because she has a constant hurting at the bottom of her abdomen, sometimes slightly more to her left side.  This comes and goes but is there on most days.  It has gotten worse over the past month.  Along with this patient has noticed that her bowel movements have changed.  She still "moves but it does not feel empty".  Tried MiraLAX once daily for a week but did not see much benefit from this.  Tells me she drinks a lot of water and does exercise on a daily basis and feels as though she has a high-fiber diet.  This pain has not changed recently, she tells me it has been there for over a year and is not much different now.    Also complains today of an epigastric pain and reflux with a lot of belching over the past 3 weeks.  She does use Aleve occasionally but "only when I need to go out and function".  This is not every day.  Patient did try what sounds like Prevacid for about a week but tells me after stopping this medication all of her symptoms came back.  Does tell me she loves fresh tomatoes and has noticed an increase in symptoms after eating these.  Also associates some of this pain coming from her gallbladder surgery which was "1,000,000 years ago".  Tells me ever since then she has had some hurting in her right upper quadrant/epigastrium.    Admits to a colonoscopy "years ago with Dr. Collene Mares".    Denies fever, chills, blood in her stool, anorexia, nausea or vomiting.  Past Medical History:  Diagnosis Date  . Hypertension   . Lupus [facility])     Past  Surgical History:  Procedure Laterality Date  . ABDOMINAL HYSTERECTOMY    . CHOLECYSTECTOMY    . SHOULDER ARTHROSCOPY W/ ROTATOR CUFF REPAIR     R shoulder  . TONSILLECTOMY      Current Outpatient Medications  Medication Sig Dispense Refill  . albuterol (VENTOLIN HFA) 108 (90 Base) MCG/ACT inhaler Inhale 1-2 puffs into the lungs every 6 (six) hours as needed for wheezing. 18 g 3  . aspirin 81 MG tablet Take 81 mg by mouth daily. Takes as needed    . budesonide-formoterol (SYMBICORT) 80-4.5 MCG/ACT inhaler Take 2 puffs first thing in am and then another 2 puffs about 12 hours later. (Patient taking differently: as needed. Take 2 puffs first thing in am and then another 2 puffs about 12 hours later.) 1 Inhaler 111  . cholecalciferol (VITAMIN D3) 25 MCG (1000 UT) tablet Take 1,000 Units by mouth as needed.     . diclofenac sodium (VOLTAREN) 1 % GEL Apply 2 g to 4 g to affected joint up to 4 times daily PRN. 4 Tube 2  . hydrochlorothiazide (HYDRODIURIL) 25 MG tablet TAKE 1 TABLET BY MOUTH EVERY DAY 90 tablet 2  . hydroxychloroquine (PLAQUENIL) 200 MG tablet TAKE 1 TABLET BY MOUTH TWICE A DAY MONDAY THRU FRIDAY 120 tablet 0  . losartan (COZAAR) 25 MG  tablet Take 1 tablet (25 mg total) by mouth daily. 30 tablet 1  . NIFEdipine (ADALAT CC) 60 MG 24 hr tablet TAKE 1 TABLET BY MOUTH EVERY DAY 30 tablet 0  . polyethylene glycol (MIRALAX / GLYCOLAX) 17 g packet Take 17 g by mouth daily. 14 each 0   No current facility-administered medications for this visit.     Allergies as of 05/01/2019  . (No Known Allergies)    Family History  Problem Relation Age of Onset  . Heart attack Mother   . Stomach cancer Father   . Hypertension Sister   . Heart disease Sister     Social History   Socioeconomic History  . Marital status: Married    Spouse name: Not on file  . Number of children: 2  . Years of education: Not on file  . Highest education level: Not on file  Occupational History  .  Occupation: retired  Engineer, production  . Financial resource strain: Not hard at all  . Food insecurity    Worry: Never true    Inability: Never true  . Transportation needs    Medical: No    Non-medical: No  Tobacco Use  . Smoking status: Never Smoker  . Smokeless tobacco: Never Used  Substance and Sexual Activity  . Alcohol use: No  . Drug use: No  . Sexual activity: Yes    Birth control/protection: Surgical  Lifestyle  . Physical activity    Days per week: 0 days    Minutes per session: 0 min  . Stress: Not at all  Relationships  . Social Musician on phone: Not on file    Gets together: Not on file    Attends religious service: Not on file    Active member of club or organization: Not on file    Attends meetings of clubs or organizations: Not on file    Relationship status: Not on file  . Intimate partner violence    Fear of current or ex partner: No    Emotionally abused: No    Physically abused: No    Forced sexual activity: No  Other Topics Concern  . Not on file  Social History Narrative  . Not on file    Review of Systems:    Constitutional: No weight loss, fever or chills Skin: No rash  Cardiovascular: No chest pain  Respiratory: No SOB Gastrointestinal: See HPI and otherwise negative Genitourinary: No dysuria Neurological: No headache, dizziness or syncope Musculoskeletal: No new muscle or joint pain Hematologic: No bleeding  Psychiatric: No history of depression or anxiety   Physical Exam:  Vital signs: BP 128/64 (BP Location: Right Arm, Patient Position: Sitting, Cuff Size: Large)   Pulse (!) 56   Temp 98.2 F (36.8 C) (Oral)   Ht 5\' 4"  (1.626 m)   Wt 170 lb 2 oz (77.2 kg)   BMI 29.20 kg/m   Constitutional:   Pleasant overweight AA female appears to be in NAD, Well developed, Well nourished, alert and cooperative Head:  Normocephalic and atraumatic. Eyes:   PEERL, EOMI. No icterus. Conjunctiva pink. Ears:  Normal auditory acuity.  Neck:  Supple Throat: Oral cavity and pharynx without inflammation, swelling or lesion.  Respiratory: Respirations even and unlabored. Lungs clear to auscultation bilaterally.   No wheezes, crackles, or rhonchi.  Cardiovascular: Normal S1, S2. No MRG. Regular rate and rhythm. No peripheral edema, cyanosis or pallor.  Gastrointestinal:  Soft, nondistended,moderate epigastric ttp and mild lower  abdominal ttp, scar in RUQ. No rebound or guarding. Normal bowel sounds. No appreciable masses or hepatomegaly. Rectal:  Not performed.  Msk:  Symmetrical without gross deformities. Without edema, no deformity or joint abnormality.  Neurologic:  Alert and  oriented x4;  grossly normal neurologically.  Skin:   Dry and intact without significant lesions or rashes. Psychiatric: Demonstrates good judgement and reason without abnormal affect or behaviors.  MOST RECENT LABS AND IMAGING: CBC    Component Value Date/Time   WBC 6.9 04/11/2019 1336   WBC 6.9 04/05/2019 1420   RBC 4.62 04/11/2019 1336   RBC 4.57 04/05/2019 1420   HGB 12.4 04/11/2019 1336   HCT 38.2 04/11/2019 1336   PLT 305 04/11/2019 1336   MCV 83 04/11/2019 1336   MCH 26.8 04/11/2019 1336   MCH 26.5 (L) 04/05/2019 1420   MCHC 32.5 04/11/2019 1336   MCHC 32.6 04/05/2019 1420   RDW 13.4 04/11/2019 1336   LYMPHSABS 2,284 04/05/2019 1420   MONOABS 528 03/24/2017 1059   EOSABS 83 04/05/2019 1420   BASOSABS 21 04/05/2019 1420    CMP     Component Value Date/Time   NA 142 04/23/2019 0907   K 3.9 04/23/2019 0907   CL 104 04/23/2019 0907   CO2 24 04/23/2019 0907   GLUCOSE 93 04/23/2019 0907   GLUCOSE 80 04/05/2019 1420   BUN 21 04/23/2019 0907   CREATININE 0.98 04/23/2019 0907   CREATININE 0.81 04/05/2019 1420   CALCIUM 9.8 04/23/2019 0907   PROT 8.1 04/11/2019 1336   ALBUMIN 4.5 04/11/2019 1336   AST 19 04/11/2019 1336   ALT 10 04/11/2019 1336   ALKPHOS 94 04/11/2019 1336   BILITOT 0.4 04/11/2019 1336   GFRNONAA 55 (L)  04/23/2019 0907   GFRNONAA 70 04/05/2019 1420   GFRAA 64 04/23/2019 0907   GFRAA 81 04/05/2019 1420    Assessment: 1.  Constipation: Worse over the past month or so, also with associated lower abdominal pain; consider IBS-C+/-diverticular disease 2.  Lower abdominal pain: Likely with above 3.  GERD: New for the patient over the past 3 weeks, occasional Aleve use, diet likely exacerbating symptoms; consider gastritis +/- PUD 4.  Epigastric pain: With above, also with some pain from overlapping adhesions after cholecystectomy years ago  Plan: 1.  Requesting records from Dr. Loreta AveMann re-colonoscopy "years ago" per patient 2.  Discussed constipation with the patient in detail, believe this is related to her lower abdominal pain.  Would recommend she use her MiraLAX once daily for the next month at least. 3.  Increase fiber to 25 to 35 g/day of the use of fiber supplement such as Citrucel which is less gas causing. 4.  Continue to drink increased water amounts and exercise. 5.  Prescribed Omeprazole 40 mg daily, 30 to 60 minutes before breakfast in the morning #30 with 3 refills 6.  Reviewed antireflux diet and lifestyle modifications.  Provided her with a handout. 7.  Patient will follow in clinic in 4 to 6 weeks with me.  Hyacinth MeekerJennifer Lemmon, PA-C [facility] 05/01/2019, 8:30 AM  Cc: Gearldine Bienenstockale, Taylor M, PA-C

## 2019-05-01 NOTE — Progress Notes (Signed)
Reviewed and agree with management plan.  Traniyah Hallett T. Abelina Ketron, MD FACG [facility]  

## 2019-05-03 ENCOUNTER — Other Ambulatory Visit: Payer: Self-pay | Admitting: Nurse Practitioner

## 2019-05-09 ENCOUNTER — Ambulatory Visit (INDEPENDENT_AMBULATORY_CARE_PROVIDER_SITE_OTHER): Payer: Medicare Other

## 2019-05-09 ENCOUNTER — Other Ambulatory Visit: Payer: Self-pay

## 2019-05-09 DIAGNOSIS — R0609 Other forms of dyspnea: Secondary | ICD-10-CM

## 2019-05-09 DIAGNOSIS — R0600 Dyspnea, unspecified: Secondary | ICD-10-CM

## 2019-05-12 ENCOUNTER — Other Ambulatory Visit: Payer: Self-pay | Admitting: Cardiology

## 2019-05-14 NOTE — Progress Notes (Signed)
Will discuss at upcoming appt. Slight progression of aortic valvular disease

## 2019-05-17 ENCOUNTER — Encounter: Payer: Self-pay | Admitting: Internal Medicine

## 2019-05-22 ENCOUNTER — Ambulatory Visit: Payer: Medicare Other | Admitting: Cardiology

## 2019-05-22 ENCOUNTER — Other Ambulatory Visit: Payer: Self-pay

## 2019-05-22 ENCOUNTER — Encounter: Payer: Self-pay | Admitting: Cardiology

## 2019-05-22 VITALS — BP 160/65 | HR 66 | Ht 64.0 in | Wt 167.3 lb

## 2019-05-23 ENCOUNTER — Encounter: Payer: Self-pay | Admitting: Nurse Practitioner

## 2019-05-23 ENCOUNTER — Ambulatory Visit (INDEPENDENT_AMBULATORY_CARE_PROVIDER_SITE_OTHER): Payer: Medicare Other | Admitting: Nurse Practitioner

## 2019-05-23 ENCOUNTER — Other Ambulatory Visit: Payer: Self-pay

## 2019-05-23 VITALS — BP 120/62 | HR 79 | Temp 98.1°F | Ht 61.8 in | Wt 168.0 lb

## 2019-05-23 DIAGNOSIS — I10 Essential (primary) hypertension: Secondary | ICD-10-CM | POA: Diagnosis not present

## 2019-05-23 DIAGNOSIS — M25562 Pain in left knee: Secondary | ICD-10-CM | POA: Diagnosis not present

## 2019-05-23 DIAGNOSIS — G8929 Other chronic pain: Secondary | ICD-10-CM

## 2019-05-23 NOTE — Progress Notes (Signed)
Subjective:     Patient ID: Jessica Booker , female    DOB: 08/06/1941 , 78 y.o.   MRN: [id number]   Chief Complaint  Patient presents with  . Hypertension    HPI  Hypertension This is a chronic problem. The current episode started more than 1 year ago. The problem has been gradually improving since onset. The problem is controlled. Associated symptoms include anxiety and chest pain (with emotional eating). Pertinent negatives include no headaches, palpitations or peripheral edema. Risk factors for coronary artery disease include sedentary lifestyle. Past treatments include diuretics. There are no compliance problems.  There is no history of angina. There is no history of chronic renal disease.     Past Medical History:  Diagnosis Date  . Hypertension   . Lupus (HCC)      Family History  Problem Relation Age of Onset  . Heart attack Mother   . Stomach cancer Father   . Hypertension Sister   . Heart disease Sister      Current Outpatient Medications:  .  albuterol (VENTOLIN HFA) 108 (90 Base) MCG/ACT inhaler, Inhale 1-2 puffs into the lungs every 6 (six) hours as needed for wheezing., Disp: 18 g, Rfl: 3 .  aspirin 81 MG tablet, Take 81 mg by mouth daily. Takes as needed, Disp: , Rfl:  .  budesonide-formoterol (SYMBICORT) 80-4.5 MCG/ACT inhaler, Take 2 puffs first thing in am and then another 2 puffs about 12 hours later. (Patient taking differently: as needed. Take 2 puffs first thing in am and then another 2 puffs about 12 hours later.), Disp: 1 Inhaler, Rfl: 111 .  diclofenac sodium (VOLTAREN) 1 % GEL, Apply 2 g to 4 g to affected joint up to 4 times daily PRN., Disp: 4 Tube, Rfl: 2 .  hydrochlorothiazide (HYDRODIURIL) 25 MG tablet, TAKE 1 TABLET BY MOUTH EVERY DAY, Disp: 90 tablet, Rfl: 2 .  hydroxychloroquine (PLAQUENIL) 200 MG tablet, TAKE 1 TABLET BY MOUTH TWICE A DAY MONDAY THRU FRIDAY, Disp: 120 tablet, Rfl: 0 .  losartan (COZAAR) 25 MG tablet, TAKE 1 TABLET BY MOUTH  EVERY DAY, Disp: 90 tablet, Rfl: 1 .  NIFEdipine (ADALAT CC) 60 MG 24 hr tablet, TAKE 1 TABLET BY MOUTH EVERY DAY, Disp: 30 tablet, Rfl: 0 .  polyethylene glycol (MIRALAX / GLYCOLAX) 17 g packet, Take 17 g by mouth daily. (Patient taking differently: Take 17 g by mouth as needed. ), Disp: 14 each, Rfl: 0 .  cholecalciferol (VITAMIN D3) 25 MCG (1000 UT) tablet, Take 1,000 Units by mouth as needed. , Disp: , Rfl:  .  omeprazole (PRILOSEC) 40 MG capsule, Take 1 capsule (40 mg total) by mouth daily. (Patient not taking: Reported on 05/23/2019), Disp: 30 capsule, Rfl: 3   No Known Allergies   Review of Systems  Respiratory: Negative.   Cardiovascular: Positive for chest pain (with emotional eating). Negative for palpitations and leg swelling.  Musculoskeletal:       Left knee pain - she has had injections by Dr. Loni Beckwith.   Neurological: Negative for dizziness and headaches.  Psychiatric/Behavioral: Negative.      Today's Vitals   05/23/19 1053  BP: 120/62  Pulse: 79  Temp: 98.1 F (36.7 C)  TempSrc: Oral  Weight: 168 lb (76.2 kg)  Height: 5' 1.8" (1.57 m)  PainSc: 0-No pain   Body mass index is 30.93 kg/m.   Objective:  Physical Exam Constitutional:      Appearance: Normal appearance.  Cardiovascular:  Rate and Rhythm: Normal rate and regular rhythm.     Pulses: Normal pulses.     Heart sounds: Normal heart sounds. No murmur.  Pulmonary:     Effort: Pulmonary effort is normal.     Breath sounds: Normal breath sounds.  Musculoskeletal:        General: No swelling, tenderness or signs of injury.     Right lower leg: No edema.     Left lower leg: No edema.     Comments: Crepitus noted to bilateral knees   Skin:    General: Skin is warm and dry.     Capillary Refill: Capillary refill takes less than 2 seconds.  Neurological:     General: No focal deficit present.     Mental Status: She is alert and oriented to person, place, and time.         Assessment And Plan:      1. Essential hypertension  Blood pressure is well controlled  Continue with current medications  2. Chronic pain of left knee  Crepitus noted to left knee  Would like a referral to orthopedic surgery  Negative drawer and lachman's test - Ambulatory referral to Orthopedic Surgery   Minette Brine, FNP    THE PATIENT IS ENCOURAGED TO PRACTICE SOCIAL DISTANCING DUE TO THE COVID-19 PANDEMIC.

## 2019-05-28 ENCOUNTER — Encounter: Payer: Self-pay | Admitting: Nurse Practitioner

## 2019-05-28 ENCOUNTER — Ambulatory Visit: Payer: Medicare Other | Admitting: Cardiology

## 2019-05-28 ENCOUNTER — Encounter: Payer: Self-pay | Admitting: Cardiology

## 2019-05-28 ENCOUNTER — Other Ambulatory Visit: Payer: Self-pay

## 2019-05-28 VITALS — BP 134/60 | HR 88 | Temp 97.8°F | Ht 61.5 in | Wt 170.8 lb

## 2019-05-28 DIAGNOSIS — I340 Nonrheumatic mitral (valve) insufficiency: Secondary | ICD-10-CM | POA: Diagnosis not present

## 2019-05-28 DIAGNOSIS — I10 Essential (primary) hypertension: Secondary | ICD-10-CM

## 2019-05-28 DIAGNOSIS — I351 Nonrheumatic aortic (valve) insufficiency: Secondary | ICD-10-CM

## 2019-05-28 MED ORDER — LOSARTAN POTASSIUM 50 MG PO TABS: 50.0000 mg | ORAL_TABLET | Freq: Every evening | ORAL | 3 refills | Status: DC

## 2019-05-28 NOTE — Progress Notes (Signed)
Primary Physician:  Jessica Chard, MD   Patient ID: Jessica Booker, female    DOB: April 14, 1941, 78 y.o.   MRN: [id number]  Subjective:    Chief Complaint  Patient presents with  . Follow-up    4 week, echo results  . Hypertension  . dyspenea on exertion  . Chest Pain    HPI: Jessica Booker  is a 78 y.o. female  with  lupus arthritis, hypertension, moderate obesity, hyperglycemia , exercise nuclear stress test in April 2019 that was considered low risk study. Echo was also performed at that time showing grade 2 diastolic dysfunction, normal LVEF, mild aortic stenosis with mild to mod aortic regurgitation.   Seen by [place] again about 6 weeks ago for chest pain and dyspnea, felt that the chest pain was related to GERD.  She was also started on losartan for hypertension.  She now presents for follow-up, has not had any further chest pain but states that she's been having abdominal discomfort.  She is being worked up by the GI.  Dyspnea has remained stable, no leg edema, no PND or orthopnea.  Past Medical History:  Diagnosis Date  . Hypertension   . Lupus [facility])     Past Surgical History:  Procedure Laterality Date  . ABDOMINAL HYSTERECTOMY    . CHOLECYSTECTOMY    . SHOULDER ARTHROSCOPY W/ ROTATOR CUFF REPAIR     R shoulder  . TONSILLECTOMY      Social History   Socioeconomic History  . Marital status: Married    Spouse name: Not on file  . Number of children: 2  . Years of education: Not on file  . Highest education level: Not on file  Occupational History  . Occupation: retired  Scientific laboratory technician  . Financial resource strain: Not hard at all  . Food insecurity    Worry: Never true    Inability: Never true  . Transportation needs    Medical: No    Non-medical: No  Tobacco Use  . Smoking status: Never Smoker  . Smokeless tobacco: Never Used  Substance and Sexual Activity  . Alcohol use: No  . Drug use: No  . Sexual activity: Yes    Birth control/protection:  Surgical  Lifestyle  . Physical activity    Days per week: 0 days    Minutes per session: 0 min  . Stress: Not at all  Relationships  . Social Herbalist on phone: Not on file    Gets together: Not on file    Attends religious service: Not on file    Active member of club or organization: Not on file    Attends meetings of clubs or organizations: Not on file    Relationship status: Not on file  . Intimate partner violence    Fear of current or ex partner: No    Emotionally abused: No    Physically abused: No    Forced sexual activity: No  Other Topics Concern  . Not on file  Social History Narrative  . Not on file    Review of Systems  Constitution: Negative for decreased appetite, malaise/fatigue, weight gain and weight loss.  Eyes: Negative for visual disturbance.  Cardiovascular: Positive for dyspnea on exertion. Negative for chest pain, claudication, leg swelling, orthopnea, palpitations and syncope.  Respiratory: Negative for hemoptysis and wheezing.   Endocrine: Negative for cold intolerance and heat intolerance.  Hematologic/Lymphatic: Does not bruise/bleed easily.  Skin: Negative for nail changes.  Musculoskeletal: Positive for back pain, joint pain and neck pain. Negative for muscle weakness and myalgias.  Gastrointestinal: Positive for abdominal pain. Negative for change in bowel habit, heartburn, nausea and vomiting.  Neurological: Negative for difficulty with concentration, dizziness, focal weakness and headaches.  Psychiatric/Behavioral: Negative for altered mental status and suicidal ideas.  All other systems reviewed and are negative.     Objective:  Blood pressure 134/60, pulse 88, temperature 97.8 F (36.6 C), height 5' 1.5" (1.562 m), weight 170 lb 12.8 oz (77.5 kg), SpO2 100 %. Body mass index is 31.75 kg/m.    Physical Exam  Constitutional: She is oriented to person, place, and time. Vital signs are normal. She appears well-developed and  well-nourished.  HENT:  Head: Normocephalic and atraumatic.  Neck: Normal range of motion.  Cardiovascular: Normal rate, regular rhythm and intact distal pulses.  Murmur heard.  Midsystolic murmur is present with a grade of 3/6 at the upper right sternal border and apex radiating to the neck. Pulses:      Carotid pulses are on the right side with bruit and on the left side with bruit. Pulmonary/Chest: Effort normal and breath sounds normal. No accessory muscle usage. No respiratory distress.  Abdominal: Soft. Bowel sounds are normal.  Musculoskeletal: Normal range of motion.  Neurological: She is alert and oriented to person, place, and time.  Skin: Skin is warm and dry.  Vitals reviewed.  Radiology: No results found.  Laboratory examination:    CMP Latest Ref Rng & Units 04/23/2019 04/11/2019 04/05/2019  Glucose 65 - 99 mg/dL 93 89 80  BUN 8 - 27 mg/dL 21 17 19   Creatinine 0.57 - 1.00 mg/dL 9.83) 3.82(N  Sodium 134 - 144 mmol/L 142 140 140  Potassium 3.5 - 5.2 mmol/L 3.9 3.6 3.7  Chloride 96 - 106 mmol/L 104 102 102  CO2 20 - 29 mmol/L 24 22 29   Calcium 8.7 - 10.3 mg/dL 9.8 10.5(H) 10.3  Total Protein 6.0 - 8.5 g/dL - 8.1 7.9  Total Bilirubin 0.0 - 1.2 mg/dL - 0.4 0.5  Alkaline Phos 39 - 117 IU/L - 94 -  AST 0 - 40 IU/L - 19 17  ALT 0 - 32 IU/L - 10 10   CBC Latest Ref Rng & Units 04/11/2019 04/05/2019 10/30/2018  WBC 3.4 - 10.8 x10E3/uL 6.9 6.9 8.2  Hemoglobin 11.1 - 15.9 g/dL 06/05/2019 12/30/2018 97.6  Hematocrit 34.0 - 46.6 % 38.2 37.1 36.8  Platelets 150 - 450 x10E3/uL 305 317 336   Lipid Panel     Component Value Date/Time   CHOL 149 04/23/2019 0906   TRIG 44 04/23/2019 0906   HDL 60 04/23/2019 0906   CHOLHDL 2.5 04/23/2019 0906   LDLCALC 80 04/23/2019 0906   HEMOGLOBIN A1C No results found for: HGBA1C, MPG TSH No results for input(s): TSH in the last 8760 hours.  PRN Meds:. Medications Discontinued During This Encounter  Medication Reason  . cholecalciferol  (VITAMIN D3) 25 MCG (1000 UT) tablet Error  . losartan (COZAAR) 25 MG tablet Reorder   Current Meds  Medication Sig  . albuterol (VENTOLIN HFA) 108 (90 Base) MCG/ACT inhaler Inhale 1-2 puffs into the lungs every 6 (six) hours as needed for wheezing.  04/25/2019 aspirin 81 MG tablet Take 81 mg by mouth daily. Takes as needed  . budesonide-formoterol (SYMBICORT) 80-4.5 MCG/ACT inhaler Take 2 puffs first thing in am and then another 2 puffs about 12 hours later. (Patient taking differently: as needed. Take 2 puffs  first thing in am and then another 2 puffs about 12 hours later.)  . diclofenac sodium (VOLTAREN) 1 % GEL Apply 2 g to 4 g to affected joint up to 4 times daily PRN.  . hydrochlorothiazide (HYDRODIURIL) 25 MG tablet TAKE 1 TABLET BY MOUTH EVERY DAY  . hydroxychloroquine (PLAQUENIL) 200 MG tablet TAKE 1 TABLET BY MOUTH TWICE A DAY MONDAY THRU FRIDAY  . losartan (COZAAR) 50 MG tablet Take 1 tablet (50 mg total) by mouth every evening.  Marland Kitchen NIFEdipine (ADALAT CC) 60 MG 24 hr tablet TAKE 1 TABLET BY MOUTH EVERY DAY  . omeprazole (PRILOSEC) 40 MG capsule Take 1 capsule (40 mg total) by mouth daily.  . polyethylene glycol (MIRALAX / GLYCOLAX) 17 g packet Take 17 g by mouth daily. (Patient taking differently: Take 17 g by mouth as needed. )  . [DISCONTINUED] losartan (COZAAR) 25 MG tablet TAKE 1 TABLET BY MOUTH EVERY DAY    Cardiac Studies:   Echocardiogram 12/14/2017: Left ventricle cavity is normal in size. Mild concentric hypertrophy of the left ventricle. Normal global wall motion. Doppler evidence of grade II (pseudonormal) diastolic dysfunction, elevated LAP. Calculated EF 55%. Left atrial cavity is moderately dilated. Mild aortic valve leaflet calcification. Mild aortic valve stenosis. Aortic valve mean gradient of 11 mmHg, Vmax of 2.2 m/s. Calculated aortic valve area by continuity equation is 1.4 cm. Mild to moderate aortic regurgitation. Mild (Grade I) mitral regurgitation. Mild tricuspid  regurgitation. Estimated pulmonary artery systolic pressure 23 mmHg. Compared to previous echocardiogram in 2013, aortic stenosis and diastolic dysfunction is new. Interval increase in aortic regurgitation severity.  Exercise myoview stress 12/02/2017: 1. The patient performed treadmill exercise using a Bruce protocol, completing 5 minutes. The patient completed an estimated workload of 7.03 METS, reaching 97% of the maximum predicted heart rate. Resting hypertension with exaggerated exercise response. Peak BP 248/48 mmHg. No stress symptoms reported. Fair exercise capacity. No ischemic changes seen on stress electrocardiogram. Frequent PVC's seen at rest, and during exercise. 2. The overall quality of the study is excellent. There is no evidence of abnormal lung activity. Stress and rest SPECT images demonstrate homogeneous tracer distribution throughout the myocardium. Gated SPECT imaging reveals normal myocardial thickening and wall motion. The left ventricular ejection fraction was normal (72%).  3. Low risk study.  Carotid artery duplex 12/13/2017: No hemodynamically significant arterial disease in the internal carotid artery bilaterally. There is heterogeneous plaque noted in the right external carotid and carotid bifurcations with less than 50%( minimal) stenosis. Further studies if clinically indicated. No significant change from 07/03/2014.  Echocardiogram 05/09/2019: Left ventricle cavity is normal in size. Mild concentric hypertrophy of the left ventricle. Normal LV systolic function with EF 69%. Normal global wall motion. Doppler evidence of grade I (impaired) diastolic dysfunction, normal LAP.  Trileaflet aortic valve. Mild aortic valve leaflet calcification. Mild aortic valve stenosis. Aortic valve mean gradient of 12 mmHg, Vmax of 2.3 m/s. Calculated aortic valve area by continuity equation is 1.7 cm. Moderate (Grade III) aortic regurgitation. Mild prolapse with moderate (Grade II)  mitral regurgitation. Mild tricuspid regurgitation. No evidence of pulmonary hypertension. Compared to previous study on 12/14/2017, aortic regurgitation severity is minimally increased.  Assessment:   Essential hypertension - Plan: losartan (COZAAR) 50 MG tablet  Moderate aortic regurgitation  Moderate mitral regurgitation  EKG 04/20/2019: Normal sinus rhythm at rate of 79 bpm, normal axis, poor R-wave progression, probably normal variant. No evidence of ischemia.   Recommendations:   Patient is here on a  6 week office visit and follow-up of echocardiogram and aortic valve disease, she also has mild mitral valve prolapse with moderate MR.  No significant change in physical exam.  She was started on losartan on her last office visit for hypertension and also mitral and aortic valve disease, she is also on Procardia for the same.  Continue the same, blood pressure is minimally elevated, will increase valsartan from 25 mg to 50 mg.  I reviewed her labs, BMP has been stable after starting losartan.  Her symptoms of chest pain are clearly related to GERD-like symptoms, she is now being evaluated by GI for the same.  Since being on omeprazole, she has not had any heartburn but still continues to have mild abdominal discomfort.  Otherwise stable from cardiac standpoint, I'll see her back in a year for follow-up of valvular heart disease. Fortunately echocardiogram does not reveal any right ventricle is strain or pulmonary hypertension, which is especially important in view of patient with history of lupus arthritis.  Jessica DecampJay Wynette Jersey, MD, [facility] Joseph CampusFACC 05/28/2019, 12:49 PM [facility] Pager: [phone] Office: [phone] If no answer Cell [phone]

## 2019-05-28 NOTE — Progress Notes (Signed)
Patient appointment changed to next week

## 2019-05-29 ENCOUNTER — Encounter: Payer: Self-pay | Admitting: Physician Assistant

## 2019-05-29 ENCOUNTER — Ambulatory Visit (INDEPENDENT_AMBULATORY_CARE_PROVIDER_SITE_OTHER): Payer: Medicare Other | Admitting: Physician Assistant

## 2019-05-29 ENCOUNTER — Other Ambulatory Visit (INDEPENDENT_AMBULATORY_CARE_PROVIDER_SITE_OTHER): Payer: Medicare Other

## 2019-05-29 VITALS — BP 130/50 | HR 51 | Temp 98.5°F | Ht 61.0 in | Wt 169.0 lb

## 2019-05-29 DIAGNOSIS — R1032 Left lower quadrant pain: Secondary | ICD-10-CM

## 2019-05-29 DIAGNOSIS — K5900 Constipation, unspecified: Secondary | ICD-10-CM | POA: Diagnosis not present

## 2019-05-29 DIAGNOSIS — K219 Gastro-esophageal reflux disease without esophagitis: Secondary | ICD-10-CM

## 2019-05-29 LAB — BASIC METABOLIC PANEL
BUN: 15 mg/dL (ref 6–23)
CO2: 29 mEq/L (ref 19–32)
Calcium: 10.1 mg/dL (ref 8.4–10.5)
Chloride: 103 mEq/L (ref 96–112)
Creatinine, Ser: 0.79 mg/dL (ref 0.40–1.20)
GFR: 85.11 mL/min (ref >60.00)
Glucose, Bld: 97 mg/dL (ref 70–99)
Potassium: 3.6 mEq/L (ref 3.5–5.1)
Sodium: 140 mEq/L (ref 135–145)

## 2019-05-29 MED ORDER — HYOSCYAMINE SULFATE 0.125 MG SL SUBL: 0.1250 mg | SUBLINGUAL_TABLET | SUBLINGUAL | 0 refills | Status: DC | PRN

## 2019-05-29 NOTE — Progress Notes (Signed)
Reviewed and agree with management plan.  Monica Zahler T. Marvis Saefong, MD FACG [facility]  

## 2019-05-29 NOTE — Patient Instructions (Signed)
Continue Omeprazole.   Continue Miralax   We have sent the following medications to your pharmacy for you to pick up at your convenience: Hyoscyamine 0.125 mg every 4-6 hours as needed   You have been scheduled for a CT scan of the abdomen and pelvis at [facility] ([address]---this is in the same building as Press [facility]).   You are scheduled on 06-06-2019 at 1:45 pm. You should arrive 15 minutes prior to your appointment time for registration. Please follow the written instructions below on the day of your exam:  WARNING: IF YOU ARE ALLERGIC TO IODINE/X-RAY DYE, PLEASE NOTIFY RADIOLOGY IMMEDIATELY AT [phone]! YOU WILL BE GIVEN A 13 HOUR PREMEDICATION PREP.  1) Do not eat or drink anything after 9:45 am (4 hours prior to your test) 2) You have been given 2 bottles of oral contrast to drink. The solution may taste better if refrigerated, but do NOT add ice or any other liquid to this solution. Shake well before drinking.    Drink 1 bottle of contrast @ 11:45 am (2 hours prior to your exam)  Drink 1 bottle of contrast @ 12:45 pm (1 hour prior to your exam)  You may take any medications as prescribed with a small amount of water, if necessary. If you take any of the following medications: METFORMIN, GLUCOPHAGE, GLUCOVANCE, AVANDAMET, RIOMET, FORTAMET, Sherrill MET, JANUMET, GLUMETZA or METAGLIP, you MAY be asked to HOLD this medication 48 hours AFTER the exam.  The purpose of you drinking the oral contrast is to aid in the visualization of your intestinal tract. The contrast solution may cause some diarrhea. Depending on your individual set of symptoms, you may also receive an intravenous injection of x-ray contrast/dye. Plan on being at [facility] for 30 minutes or longer, depending on the type of exam you are having performed.  This test typically takes 30-45 minutes to complete.  If you have any questions regarding your exam or if you need to  reschedule, you may call the CT department at [phone] between the hours of 8:00 am and 5:00 pm, Monday-Friday.  ________________________________________________________________________

## 2019-05-29 NOTE — Progress Notes (Signed)
Chief Complaint: Follow-up constipation abdominal pain and GERD  HPI:    Jessica Booker is a 78 year old African-American female with a past medical history as listed below including lupus, last echo 05/09/2019 with LVEF 69% and mild aortic valve stenosis, assigned to Dr. Fuller Plan at last visit, who returns to clinic today for follow-up of epigastric pain, lower abdominal pain, constipation and GERD.    05/01/2019 patient seen in clinic and described a constant hurting at the bottom of her abdomen sometimes slightly more to the left side which seem to come and go, also described that she did not feel empty when she moved her bowels.  Also complained of epigastric pain and reflux a lot of belching.  At that time discussed constipation, discussed that I thought this is related to her lower abdominal pain recommend she use MiraLAX once daily for the next month, also increase fiber and drink more water.  Prescribed omeprazole 40 mg daily.    Today, the patient tells me that she has been using her MiraLAX once a day and is having more full regular bowel movements that are still soft solid with no diarrhea.  Has also been using her Omeprazole 40 mg daily and has no further reflux symptoms but does continue with a small amount of epigastric pain which radiates across underneath her diaphragm, this is 70% better than it was before.    Most concerning to the patient today is that she continues with a left lower quadrant pain which sometimes wraps around to her hip and at times radiates down her leg.  This pain is not typically there in the morning when she is walking but later in the day when she is "messing around the house" it starts to bother her and feels like a "cramping/heaviness".  Sometimes if she sits down this will feel slightly better but she cannot sit too long or her hip start bothering her.  This discomfort is rated as a 5-6/10.  Having a bowel movement does not seem to relieve it.    Denies fever, chills or  blood in her stool.    Past Medical History:  Diagnosis Date  . Hypertension   . Lupus [facility])     Past Surgical History:  Procedure Laterality Date  . ABDOMINAL HYSTERECTOMY    . CHOLECYSTECTOMY    . SHOULDER ARTHROSCOPY W/ ROTATOR CUFF REPAIR     R shoulder  . TONSILLECTOMY      Current Outpatient Medications  Medication Sig Dispense Refill  . albuterol (VENTOLIN HFA) 108 (90 Base) MCG/ACT inhaler Inhale 1-2 puffs into the lungs every 6 (six) hours as needed for wheezing. 18 g 3  . aspirin 81 MG tablet Take 81 mg by mouth daily. Takes as needed    . budesonide-formoterol (SYMBICORT) 80-4.5 MCG/ACT inhaler Take 2 puffs first thing in am and then another 2 puffs about 12 hours later. (Patient taking differently: as needed. Take 2 puffs first thing in am and then another 2 puffs about 12 hours later.) 1 Inhaler 111  . diclofenac sodium (VOLTAREN) 1 % GEL Apply 2 g to 4 g to affected joint up to 4 times daily PRN. 4 Tube 2  . hydrochlorothiazide (HYDRODIURIL) 25 MG tablet TAKE 1 TABLET BY MOUTH EVERY DAY 90 tablet 2  . hydroxychloroquine (PLAQUENIL) 200 MG tablet TAKE 1 TABLET BY MOUTH TWICE A DAY MONDAY THRU FRIDAY 120 tablet 0  . losartan (COZAAR) 50 MG tablet Take 1 tablet (50 mg total) by mouth every  evening. 90 tablet 3  . NIFEdipine (ADALAT CC) 60 MG 24 hr tablet TAKE 1 TABLET BY MOUTH EVERY DAY 30 tablet 0  . omeprazole (PRILOSEC) 40 MG capsule Take 1 capsule (40 mg total) by mouth daily. 30 capsule 3  . polyethylene glycol (MIRALAX / GLYCOLAX) 17 g packet Take 17 g by mouth daily. (Patient taking differently: Take 17 g by mouth as needed. ) 14 each 0   No current facility-administered medications for this visit.     Allergies as of 05/29/2019  . (No Known Allergies)    Family History  Problem Relation Age of Onset  . Heart attack Mother   . Stomach cancer Father   . Hypertension Sister   . Heart disease Sister     Social History   Socioeconomic History  . Marital  status: Married    Spouse name: Not on file  . Number of children: 2  . Years of education: Not on file  . Highest education level: Not on file  Occupational History  . Occupation: retired  Engineer, productionocial Needs  . Financial resource strain: Not hard at all  . Food insecurity    Worry: Never true    Inability: Never true  . Transportation needs    Medical: No    Non-medical: No  Tobacco Use  . Smoking status: Never Smoker  . Smokeless tobacco: Never Used  Substance and Sexual Activity  . Alcohol use: No  . Drug use: No  . Sexual activity: Yes    Birth control/protection: Surgical  Lifestyle  . Physical activity    Days per week: 0 days    Minutes per session: 0 min  . Stress: Not at all  Relationships  . Social Musicianconnections    Talks on phone: Not on file    Gets together: Not on file    Attends religious service: Not on file    Active member of club or organization: Not on file    Attends meetings of clubs or organizations: Not on file    Relationship status: Not on file  . Intimate partner violence    Fear of current or ex partner: No    Emotionally abused: No    Physically abused: No    Forced sexual activity: No  Other Topics Concern  . Not on file  Social History Narrative  . Not on file    Review of Systems:    Constitutional: No weight loss, fever or chills Cardiovascular: No chest pain Respiratory: No SOB Gastrointestinal: See HPI and otherwise negative   Physical Exam:  Vital signs: BP (!) 130/50   Pulse (!) 51   Temp 98.5 F (36.9 C)   Ht 5\' 1"  (1.549 m)   Wt 169 lb (76.7 kg)   BMI 31.93 kg/m   Constitutional:   Pleasant AA female appears to be in NAD, Well developed, Well nourished, alert and cooperative Respiratory: Respirations even and unlabored. Lungs clear to auscultation bilaterally.   No wheezes, crackles, or rhonchi.  Cardiovascular: Normal S1, S2. No MRG. Regular rate and rhythm. No peripheral edema, cyanosis or pallor.  Gastrointestinal:   Soft, nondistended, mild epigastric ttp, mild LLQ ttp No rebound or guarding. Normal bowel sounds. No appreciable masses or hepatomegaly. Rectal:  Not performed.  Msk:  Symmetrical without gross deformities. Without edema, no deformity or joint abnormality. No tenderness over left hip joint with manipulation Psychiatric:  Demonstrates good judgement and reason without abnormal affect or behaviors.  MOST RECENT LABS AND IMAGING:  CBC    Component Value Date/Time   WBC 6.9 04/11/2019 1336   WBC 6.9 04/05/2019 1420   RBC 4.62 04/11/2019 1336   RBC 4.57 04/05/2019 1420   HGB 12.4 04/11/2019 1336   HCT 38.2 04/11/2019 1336   PLT 305 04/11/2019 1336   MCV 83 04/11/2019 1336   MCH 26.8 04/11/2019 1336   MCH 26.5 (L) 04/05/2019 1420   MCHC 32.5 04/11/2019 1336   MCHC 32.6 04/05/2019 1420   RDW 13.4 04/11/2019 1336   LYMPHSABS 2,284 04/05/2019 1420   MONOABS 528 03/24/2017 1059   EOSABS 83 04/05/2019 1420   BASOSABS 21 04/05/2019 1420    CMP     Component Value Date/Time   NA 142 04/23/2019 0907   K 3.9 04/23/2019 0907   CL 104 04/23/2019 0907   CO2 24 04/23/2019 0907   GLUCOSE 93 04/23/2019 0907   GLUCOSE 80 04/05/2019 1420   BUN 21 04/23/2019 0907   CREATININE 0.98 04/23/2019 0907   CREATININE 0.81 04/05/2019 1420   CALCIUM 9.8 04/23/2019 0907   PROT 8.1 04/11/2019 1336   ALBUMIN 4.5 04/11/2019 1336   AST 19 04/11/2019 1336   ALT 10 04/11/2019 1336   ALKPHOS 94 04/11/2019 1336   BILITOT 0.4 04/11/2019 1336   GFRNONAA 55 (L) 04/23/2019 0907   GFRNONAA 70 04/05/2019 1420   GFRAA 64 04/23/2019 0907   GFRAA 81 04/05/2019 1420    Assessment: 1.  Left lower quadrant abdominal pain: Continues per patient even though her bowel movements are more regular ; consider diverticulitis versus referred back pain versus other  2.  Epigastric pain/GERD: Epigastric pain is improving and reflux is gone on Omeprazole 40 mg daily 3.  Constipation: Better with MiraLAX daily  Plan: 1.   Continue MiraLAX daily 2.  Continue Omeprazole 40 mg daily for at least the next 2 months, can discuss decreasing after that 3.  Ordered CT abdomen pelvis with contrast for further evaluation of continued left lower quadrant pain.  Ordered BMP for creatinine update. 4.  Prescribed Levsin #10 every 4-6 hours with no refill.  Discussed she can use this as needed for left lower quadrant pain to see if it helps. 4.  Patient to follow in clinic per recommendations after imaging above.  Hyacinth Meeker, PA-C [facility] 05/29/2019, 8:23 AM  Cc: Dorothyann Peng, MD

## 2019-06-06 ENCOUNTER — Ambulatory Visit (INDEPENDENT_AMBULATORY_CARE_PROVIDER_SITE_OTHER)
Admission: RE | Admit: 2019-06-06 | Discharge: 2019-06-06 | Disposition: A | Discharge status: Home / Self Care | Payer: Medicare Other | Source: Ambulatory Visit | Attending: Physician Assistant | Admitting: Physician Assistant

## 2019-06-06 ENCOUNTER — Other Ambulatory Visit: Payer: Self-pay

## 2019-06-06 DIAGNOSIS — R1032 Left lower quadrant pain: Secondary | ICD-10-CM | POA: Diagnosis not present

## 2019-06-06 MED ORDER — IOHEXOL 300 MG/ML  SOLN
100.0000 mL | Freq: Once | INTRAMUSCULAR | Status: AC | PRN
  Administered 2019-06-06: 100 mL via INTRAVENOUS

## 2019-06-07 ENCOUNTER — Other Ambulatory Visit: Payer: Self-pay | Admitting: Nurse Practitioner

## 2019-06-08 ENCOUNTER — Telehealth: Payer: Self-pay | Admitting: Physician Assistant

## 2019-06-08 NOTE — Telephone Encounter (Signed)
Pt returns Jennifer's call from yesterday. Since she is off this afternoon, could you please call pt back? Thank you.

## 2019-06-11 NOTE — Telephone Encounter (Signed)
See result phone note 

## 2019-06-11 NOTE — Telephone Encounter (Signed)
Please call patient. I believe I sent you results from CT. Ask if Levsin helping. Thanks-JLL

## 2019-07-04 ENCOUNTER — Other Ambulatory Visit: Payer: Self-pay | Admitting: Nurse Practitioner

## 2019-08-01 ENCOUNTER — Other Ambulatory Visit: Payer: Self-pay | Admitting: Nurse Practitioner

## 2019-08-17 ENCOUNTER — Other Ambulatory Visit: Payer: Self-pay | Admitting: Nurse Practitioner

## 2019-08-20 ENCOUNTER — Other Ambulatory Visit: Payer: Self-pay | Admitting: Physician Assistant

## 2019-08-20 NOTE — Telephone Encounter (Signed)
Would you like this rx refilled. Per your last note you stated 2 months then you will reevaluate if needed.

## 2019-08-22 ENCOUNTER — Ambulatory Visit (INDEPENDENT_AMBULATORY_CARE_PROVIDER_SITE_OTHER): Payer: Medicare Other | Admitting: Internal Medicine

## 2019-08-22 ENCOUNTER — Encounter: Payer: Self-pay | Admitting: Internal Medicine

## 2019-08-22 ENCOUNTER — Other Ambulatory Visit: Payer: Self-pay

## 2019-08-22 VITALS — BP 126/74 | HR 55 | Temp 98.4°F | Ht 61.0 in | Wt 173.2 lb

## 2019-08-22 DIAGNOSIS — E6609 Other obesity due to excess calories: Secondary | ICD-10-CM

## 2019-08-22 DIAGNOSIS — R0982 Postnasal drip: Secondary | ICD-10-CM | POA: Diagnosis not present

## 2019-08-22 DIAGNOSIS — I10 Essential (primary) hypertension: Secondary | ICD-10-CM | POA: Diagnosis not present

## 2019-08-22 DIAGNOSIS — Z6832 Body mass index (BMI) 32.0-32.9, adult: Secondary | ICD-10-CM

## 2019-08-22 DIAGNOSIS — Z20828 Contact with and (suspected) exposure to other viral communicable diseases: Secondary | ICD-10-CM

## 2019-08-24 LAB — NOVEL CORONAVIRUS, NAA: SARS-CoV-2, NAA: NOT DETECTED

## 2019-08-25 ENCOUNTER — Other Ambulatory Visit: Payer: Self-pay | Admitting: Nurse Practitioner

## 2019-08-25 NOTE — Progress Notes (Signed)
This visit occurred during the SARS-CoV-2 public health emergency.  Safety protocols were in place, including screening questions prior to the visit, additional usage of staff PPE, and extensive cleaning of exam room while observing appropriate contact time as indicated for disinfecting solutions.  Subjective:     Patient ID: Jessica Booker , female    DOB: 1941-04-22 , 78 y.o.   MRN: [id number]   Chief Complaint  Patient presents with  . Hypertension    HPI  Hypertension This is a new problem. The problem has been gradually improving since onset. The problem is controlled. Pertinent negatives include no chest pain, headaches or palpitations. The current treatment provides moderate improvement. Compliance problems include exercise.      Past Medical History:  Diagnosis Date  . Asthma   . Hypertension   . Lupus (HCC)      Family History  Problem Relation Age of Onset  . Heart attack Mother   . Stomach cancer Father   . Hypertension Sister   . Heart disease Sister      Current Outpatient Medications:  .  albuterol (VENTOLIN HFA) 108 (90 Base) MCG/ACT inhaler, Inhale 1-2 puffs into the lungs every 6 (six) hours as needed for wheezing., Disp: 18 g, Rfl: 3 .  aspirin 81 MG tablet, Take 81 mg by mouth daily. Takes as needed, Disp: , Rfl:  .  budesonide-formoterol (SYMBICORT) 80-4.5 MCG/ACT inhaler, Take 2 puffs first thing in am and then another 2 puffs about 12 hours later. (Patient taking differently: as needed. Take 2 puffs first thing in am and then another 2 puffs about 12 hours later.), Disp: 1 Inhaler, Rfl: 111 .  diclofenac sodium (VOLTAREN) 1 % GEL, Apply 2 g to 4 g to affected joint up to 4 times daily PRN., Disp: 4 Tube, Rfl: 2 .  hydrochlorothiazide (HYDRODIURIL) 25 MG tablet, TAKE 1 TABLET BY MOUTH EVERY DAY, Disp: 90 tablet, Rfl: 1 .  hydroxychloroquine (PLAQUENIL) 200 MG tablet, TAKE 1 TABLET BY MOUTH TWICE A DAY MONDAY THRU FRIDAY, Disp: 120 tablet, Rfl: 0 .   losartan (COZAAR) 50 MG tablet, Take 1 tablet (50 mg total) by mouth every evening., Disp: 90 tablet, Rfl: 3 .  NIFEdipine (ADALAT CC) 60 MG 24 hr tablet, TAKE 1 TABLET BY MOUTH EVERY DAY, Disp: 30 tablet, Rfl: 0 .  polyethylene glycol (MIRALAX / GLYCOLAX) 17 g packet, Take 17 g by mouth daily. (Patient taking differently: Take 17 g by mouth as needed. ), Disp: 14 each, Rfl: 0 .  hyoscyamine (LEVSIN SL) 0.125 MG SL tablet, Place 1 tablet (0.125 mg total) under the tongue every 4 (four) hours as needed for cramping. (Patient not taking: Reported on 08/22/2019), Disp: 10 tablet, Rfl: 0 .  omeprazole (PRILOSEC) 40 MG capsule, TAKE 1 CAPSULE BY MOUTH EVERY DAY, Disp: 90 capsule, Rfl: 1   No Known Allergies   Review of Systems  Constitutional: Negative.   HENT: Positive for postnasal drip.   Respiratory: Negative.   Cardiovascular: Negative.  Negative for chest pain and palpitations.  Gastrointestinal: Negative.   Neurological: Negative.  Negative for headaches.  Psychiatric/Behavioral: Negative.      Today's Vitals   08/22/19 1009  BP: 126/74  Pulse: (!) 55  Temp: 98.4 F (36.9 C)  TempSrc: Oral  Weight: 173 lb 3.2 oz (78.6 kg)  Height: 5\' 1"  (1.549 m)  PainSc: 5   PainLoc: Abdomen   Body mass index is 32.73 kg/m.   Objective:  Physical Exam Vitals  and nursing note reviewed.  Constitutional:      Appearance: Normal appearance.  HENT:     Head: Normocephalic and atraumatic.     Nose:     Comments: Deferred, masked    Mouth/Throat:     Comments: Deferred, masked Cardiovascular:     Rate and Rhythm: Normal rate and regular rhythm.     Heart sounds: Normal heart sounds.  Pulmonary:     Effort: Pulmonary effort is normal.     Breath sounds: Normal breath sounds.  Skin:    General: Skin is warm.  Neurological:     General: No focal deficit present.     Mental Status: She is alert.  Psychiatric:        Mood and Affect: Mood normal.        Behavior: Behavior normal.          Assessment And Plan:     1. Essential hypertension  Chronic, well controlled. She will continue with current meds. She is encouraged to incorporate more exercise into her daily routine.    2. Postnasal drip  I will perform Covid testing as requested. She is also advised to try loratadine 10mg  daily as needed.   - Novel Coronavirus, NAA ([facility])  3. Class 1 obesity due to excess calories with serious comorbidity and body mass index (BMI) of 32.0 to 32.9 in adult  She is encouraged to incorporate more exercise into her daily routine. She is encouraged to strive for BMI less than 29 to decrease cardiac risk.   Maximino Greenland, MD    THE PATIENT IS ENCOURAGED TO PRACTICE SOCIAL DISTANCING DUE TO THE COVID-19 PANDEMIC.

## 2019-09-06 ENCOUNTER — Ambulatory Visit: Payer: Medicare Other | Admitting: Rheumatology

## 2019-09-12 ENCOUNTER — Telehealth: Payer: Self-pay

## 2019-09-12 NOTE — Telephone Encounter (Signed)
I returned the pt's call and left a message at the pt's request on her voicemail with the number to call and make an appt to get her covid vaccination.  The number for the appts is [phone].

## 2019-09-13 ENCOUNTER — Other Ambulatory Visit: Payer: Self-pay | Admitting: Rheumatology

## 2019-09-13 DIAGNOSIS — M3219 Other organ or system involvement in systemic lupus erythematosus: Secondary | ICD-10-CM

## 2019-09-13 NOTE — Telephone Encounter (Signed)
Last Visit: 04/05/19 Next Visit: 10/29/19 Labs: 04/11/19 Creat. 1.04 GFR 59 Calcium 10.5  PLQ Eye Exam: 01/04/19 WNL  Patient advised she is due to update labs. Patient will come 09/18/19  Okay to refill per Dr. Corliss Skains

## 2019-09-16 ENCOUNTER — Other Ambulatory Visit: Payer: Self-pay | Admitting: Nurse Practitioner

## 2019-09-18 ENCOUNTER — Other Ambulatory Visit: Payer: Self-pay | Admitting: *Deleted

## 2019-09-18 DIAGNOSIS — Z79899 Other long term (current) drug therapy: Secondary | ICD-10-CM

## 2019-09-18 LAB — COMPLETE METABOLIC PANEL WITH GFR
AG Ratio: 1.3 (calc) (ref 1.0–2.5)
ALT: 9 U/L (ref 6–29)
AST: 15 U/L (ref 10–35)
Albumin: 4.1 g/dL (ref 3.6–5.1)
Alkaline phosphatase (APISO): 79 U/L (ref 37–153)
BUN: 16 mg/dL (ref 7–25)
CO2: 30 mmol/L (ref 20–32)
Calcium: 9.3 mg/dL (ref 8.6–10.4)
Chloride: 100 mmol/L (ref 98–110)
Creat: 0.91 mg/dL (ref 0.60–0.93)
GFR, Est African American: 70 mL/min/{1.73_m2} (ref >=60)
GFR, Est Non African American: 60 mL/min/{1.73_m2} (ref >=60)
Globulin: 3.2 g/dL (calc) (ref 1.9–3.7)
Glucose, Bld: 78 mg/dL (ref 65–99)
Potassium: 3.5 mmol/L (ref 3.5–5.3)
Sodium: 137 mmol/L (ref 135–146)
Total Bilirubin: 0.4 mg/dL (ref 0.2–1.2)
Total Protein: 7.3 g/dL (ref 6.1–8.1)

## 2019-09-18 LAB — CBC WITH DIFFERENTIAL/PLATELET
Absolute Monocytes: 733 cells/uL (ref 200–950)
Basophils Absolute: 7 cells/uL (ref 0–200)
Basophils Relative: 0.1 %
Eosinophils Absolute: 89 cells/uL (ref 15–500)
Eosinophils Relative: 1.2 %
HCT: 35 % (ref 35.0–45.0)
Hemoglobin: 11.5 g/dL — ABNORMAL LOW (ref 11.7–15.5)
Lymphs Abs: 2190 cells/uL (ref 850–3900)
MCH: 27 pg (ref 27.0–33.0)
MCHC: 32.9 g/dL (ref 32.0–36.0)
MCV: 82.2 fL (ref 80.0–100.0)
MPV: 10.8 fL (ref 7.5–12.5)
Monocytes Relative: 9.9 %
Neutro Abs: 4381 cells/uL (ref 1500–7800)
Neutrophils Relative %: 59.2 %
Platelets: 273 10*3/uL (ref 140–400)
RBC: 4.26 10*6/uL (ref 3.80–5.10)
RDW: 12.4 % (ref 11.0–15.0)
Total Lymphocyte: 29.6 %
WBC: 7.4 10*3/uL (ref 3.8–10.8)

## 2019-09-19 NOTE — Progress Notes (Signed)
CBC and CMP are within normal limits except mild anemia noted.  Please forward labs to her PCP.

## 2019-09-25 ENCOUNTER — Ambulatory Visit: Payer: Medicare PPO

## 2019-10-04 ENCOUNTER — Ambulatory Visit: Payer: Medicare PPO | Attending: Internal Medicine

## 2019-10-04 DIAGNOSIS — Z23 Encounter for immunization: Secondary | ICD-10-CM

## 2019-10-04 NOTE — Progress Notes (Signed)
   Covid-19 Vaccination Clinic  Name:  Jessica Booker    MRN: [id number] DOB: 05-Jul-1941  10/04/2019  [name] was observed post Covid-19 immunization for 15 minutes without incidence. She was provided with Vaccine Information Sheet and instruction to access the V-Safe system.   [name] was instructed to call 911 with any severe reactions post vaccine: Marland Kitchen Difficulty breathing  . Swelling of your face and throat  . A fast heartbeat  . A bad rash all over your body  . Dizziness and weakness    Immunizations Administered    Name Date Dose VIS Date Route   Pfizer COVID-19 Vaccine 10/04/2019  3:51 PM 0.3 mL 08/10/2019 Intramuscular   Manufacturer: [employer], [employer]   Lot: [id number]   NDC: [address]-3

## 2019-10-05 ENCOUNTER — Other Ambulatory Visit: Payer: Self-pay | Admitting: Rheumatology

## 2019-10-05 DIAGNOSIS — M3219 Other organ or system involvement in systemic lupus erythematosus: Secondary | ICD-10-CM

## 2019-10-05 NOTE — Telephone Encounter (Signed)
Last Visit: 04/05/19 Next Visit: 10/29/19 Labs: 09/18/19 within normal limits except mild anemia noted PLQ Eye Exam: 01/04/19 WNL  Okay to refill per Dr. Corliss Skains

## 2019-10-10 ENCOUNTER — Other Ambulatory Visit: Payer: Self-pay | Admitting: Internal Medicine

## 2019-10-23 ENCOUNTER — Other Ambulatory Visit: Payer: Self-pay | Admitting: Internal Medicine

## 2019-10-23 NOTE — Progress Notes (Deleted)
Office Visit Note  Patient: Jessica Booker             Date of Birth: Apr 19, 1941           MRN: [id number]             PCP: Dorothyann Peng, MD Referring: Dorothyann Peng, MD Visit Date: 10/29/2019 Occupation: @GUAROCC @  Subjective:  No chief complaint on file.   History of Present Illness: Jessica Booker is a 79 y.o. female ***   Activities of Daily Living:  Patient reports morning stiffness for *** {minute/hour:19697}.   Patient {ACTIONS;DENIES/REPORTS:21021675::"Denies"} nocturnal pain.  Difficulty dressing/grooming: {ACTIONS;DENIES/REPORTS:21021675::"Denies"} Difficulty climbing stairs: {ACTIONS;DENIES/REPORTS:21021675::"Denies"} Difficulty getting out of chair: {ACTIONS;DENIES/REPORTS:21021675::"Denies"} Difficulty using hands for taps, buttons, cutlery, and/or writing: {ACTIONS;DENIES/REPORTS:21021675::"Denies"}  No Rheumatology ROS completed.   PMFS History:  Patient Active Problem List   Diagnosis Date Noted  . Dyspnea 04/11/2019  . Belching 04/11/2019  . Constipation 04/11/2019  . Essential hypertension 11/02/2018  . Lupus (HCC) 11/02/2018  . Primary osteoarthritis of both knees 06/06/2018  . Cough variant asthma 08/25/2017  . Systemic lupus erythematosus (HCC) 02/18/2017  . High risk medication use 02/18/2017  . Raynaud's disease without gangrene 02/18/2017  . DDD (degenerative disc disease), lumbar 02/18/2017  . Primary osteoarthritis of both hands 02/18/2017  . Primary osteoarthritis of both feet 02/18/2017  . Vitamin D deficiency 02/18/2017  . History of hypertension 02/18/2017  . History of neuropathy 02/18/2017    Past Medical History:  Diagnosis Date  . Asthma   . Hypertension   . Lupus (HCC)     Family History  Problem Relation Age of Onset  . Heart attack Mother   . Stomach cancer Father   . Hypertension Sister   . Heart disease Sister    Past Surgical History:  Procedure Laterality Date  . ABDOMINAL HYSTERECTOMY    .  CHOLECYSTECTOMY    . SHOULDER ARTHROSCOPY W/ ROTATOR CUFF REPAIR     R shoulder  . TONSILLECTOMY     Social History   Social History Narrative  . Not on file   Immunization History  Administered Date(s) Administered  . Influenza, High Dose Seasonal PF 06/21/2017, 05/29/2018, 05/16/2019  . Influenza-Unspecified 05/30/2018, 05/16/2019  . PFIZER SARS-COV-2 Vaccination 10/04/2019  . Pneumococcal Conjugate-13 07/06/2019  . Zoster Recombinat (Shingrix) 05/29/2018, 08/01/2018     Objective: Vital Signs: There were no vitals taken for this visit.   Physical Exam   Musculoskeletal Exam: ***  CDAI Exam: CDAI Score: -- Patient Global: --; Provider Global: -- Swollen: --; Tender: -- Joint Exam 10/29/2019   No joint exam has been documented for this visit   There is currently no information documented on the homunculus. Go to the Rheumatology activity and complete the homunculus joint exam.  Investigation: No additional findings.  Imaging: No results found.  Recent Labs: Lab Results  Component Value Date   WBC 7.4 09/18/2019   HGB 11.5 (L) 09/18/2019   PLT 273 09/18/2019   NA 137 09/18/2019   K 3.5 09/18/2019   CL 100 09/18/2019   CO2 30 09/18/2019   GLUCOSE 78 09/18/2019   BUN 16 09/18/2019   CREATININE 0.91 09/18/2019   BILITOT 0.4 09/18/2019   ALKPHOS 94 04/11/2019   AST 15 09/18/2019   ALT 9 09/18/2019   PROT 7.3 09/18/2019   ALBUMIN 4.5 04/11/2019   CALCIUM 9.3 09/18/2019   GFRAA 70 09/18/2019    Speciality Comments: PLQ Eye Exam: 01/04/19 WNL @ [facility] Follow up in  1 year.   Procedures:  No procedures performed Allergies: Patient has no known allergies.   Assessment / Plan:     Visit Diagnoses: No diagnosis found.  Orders: No orders of the defined types were placed in this encounter.  No orders of the defined types were placed in this encounter.   Face-to-face time spent with patient was *** minutes. Greater than 50% of time was  spent in counseling and coordination of care.  Follow-Up Instructions: No follow-ups on file.   Earnestine Mealing, CMA  Note - This record has been created using Editor, commissioning.  Chart creation errors have been sought, but may not always  have been located. Such creation errors do not reflect on  the standard of medical care.

## 2019-10-29 ENCOUNTER — Ambulatory Visit: Payer: Medicare PPO | Admitting: Physician Assistant

## 2019-10-30 ENCOUNTER — Ambulatory Visit: Payer: Medicare PPO | Attending: Internal Medicine

## 2019-10-30 DIAGNOSIS — Z23 Encounter for immunization: Secondary | ICD-10-CM | POA: Insufficient documentation

## 2019-10-30 NOTE — Progress Notes (Signed)
   Covid-19 Vaccination Clinic  Name:  KATHRINE RIEVES    MRN: [id number] DOB: 03-28-1941  10/30/2019  Ms. Bonillas was observed post Covid-19 immunization for 15 minutes without incident. She was provided with Vaccine Information Sheet and instruction to access the V-Safe system.   Ms. Reddish was instructed to call 911 with any severe reactions post vaccine: Marland Kitchen Difficulty breathing  . Swelling of face and throat  . A fast heartbeat  . A bad rash all over body  . Dizziness and weakness   Immunizations Administered    Name Date Dose VIS Date Route   Pfizer COVID-19 Vaccine 10/30/2019 12:19 PM 0.3 mL 08/10/2019 Intramuscular   Manufacturer: [employer], [employer]   Lot: [id number]   NDC: [address]-3

## 2019-11-14 ENCOUNTER — Other Ambulatory Visit: Payer: Self-pay

## 2019-11-14 ENCOUNTER — Ambulatory Visit: Payer: Medicare Other

## 2019-11-14 ENCOUNTER — Encounter: Payer: Self-pay | Admitting: Internal Medicine

## 2019-11-14 ENCOUNTER — Ambulatory Visit (INDEPENDENT_AMBULATORY_CARE_PROVIDER_SITE_OTHER): Payer: Medicare PPO

## 2019-11-14 ENCOUNTER — Ambulatory Visit (INDEPENDENT_AMBULATORY_CARE_PROVIDER_SITE_OTHER): Payer: Medicare PPO | Admitting: Internal Medicine

## 2019-11-14 VITALS — BP 126/64 | HR 80 | Temp 98.5°F | Ht 61.4 in | Wt 166.6 lb

## 2019-11-14 DIAGNOSIS — Z0000 Encounter for general adult medical examination without abnormal findings: Secondary | ICD-10-CM

## 2019-11-14 DIAGNOSIS — R0982 Postnasal drip: Secondary | ICD-10-CM | POA: Diagnosis not present

## 2019-11-14 DIAGNOSIS — M3219 Other organ or system involvement in systemic lupus erythematosus: Secondary | ICD-10-CM

## 2019-11-14 DIAGNOSIS — I10 Essential (primary) hypertension: Secondary | ICD-10-CM

## 2019-11-14 DIAGNOSIS — E6609 Other obesity due to excess calories: Secondary | ICD-10-CM

## 2019-11-14 DIAGNOSIS — Z6831 Body mass index (BMI) 31.0-31.9, adult: Secondary | ICD-10-CM

## 2019-11-14 LAB — POCT URINALYSIS DIPSTICK
Blood, UA: NEGATIVE
Glucose, UA: NEGATIVE
Nitrite, UA: NEGATIVE
Protein, UA: POSITIVE — AB
Spec Grav, UA: 1.025 (ref 1.010–1.025)
Urobilinogen, UA: 0.2 E.U./dL
pH, UA: 6 (ref 5.0–8.0)

## 2019-11-14 LAB — POCT UA - MICROALBUMIN
Creatinine, POC: 300 mg/dL
Microalbumin Ur, POC: 80 mg/L

## 2019-11-14 MED ORDER — LORATADINE 10 MG PO TABS: 10.0000 mg | ORAL_TABLET | Freq: Every day | ORAL | 1 refills | Status: DC

## 2019-11-14 NOTE — Progress Notes (Signed)
This visit occurred during the SARS-CoV-2 public health emergency.  Safety protocols were in place, including screening questions prior to the visit, additional usage of staff PPE, and extensive cleaning of exam room while observing appropriate contact time as indicated for disinfecting solutions.  Subjective:   Jessica Booker is a 79 y.o. female who presents for Medicare Annual (Subsequent) preventive examination.  Review of Systems:  n/a Cardiac Risk Factors include: advanced age (>69men, >32 women);hypertension;obesity (BMI >30kg/m2)     Objective:     Vitals: BP 126/64 (BP Location: Left Arm, Patient Position: Sitting, Cuff Size: Normal)   Pulse 80   Temp 98.5 F (36.9 C) (Oral)   Ht 5' 1.4" (1.56 m)   Wt 166 lb 9.6 oz (75.6 kg)   BMI 31.07 kg/m   Body mass index is 31.07 kg/m.  Advanced Directives 11/14/2019 11/02/2018 02/23/2016 08/04/2015  Does Patient Have a Medical Advance Directive? Yes Yes No No  Type of Estate agent of [place];Living will Living will;Healthcare Power of Attorney - -  Does patient want to make changes to medical advance directive? - No - Patient declined - -  Copy of Healthcare Power of Attorney in Chart? No - copy requested No - copy requested - -    Tobacco Social History   Tobacco Use  Smoking Status Never Smoker  Smokeless Tobacco Never Used     Counseling given: Not Answered   Clinical Intake:  Pre-visit preparation completed: Yes  Pain : No/denies pain     Nutritional Status: BMI > 30  Obese Nutritional Risks: None Diabetes: No  How often do you need to have someone help you when you read instructions, pamphlets, or other written materials from your doctor or pharmacy?: 1 - Never What is the last grade level you completed in school?: college  Interpreter Needed?: No  Information entered by :: NAllen LPN  Past Medical History:  Diagnosis Date  . Asthma   . Hypertension   . Lupus [facility])    Past  Surgical History:  Procedure Laterality Date  . ABDOMINAL HYSTERECTOMY    . CHOLECYSTECTOMY    . SHOULDER ARTHROSCOPY W/ ROTATOR CUFF REPAIR     R shoulder  . TONSILLECTOMY     Family History  Problem Relation Age of Onset  . Heart attack Mother   . Stomach cancer Father   . Hypertension Sister   . Heart disease Sister    Social History   Socioeconomic History  . Marital status: Married    Spouse name: Not on file  . Number of children: 2  . Years of education: Not on file  . Highest education level: Not on file  Occupational History  . Occupation: retired  Tobacco Use  . Smoking status: Never Smoker  . Smokeless tobacco: Never Used  Substance and Sexual Activity  . Alcohol use: No  . Drug use: No  . Sexual activity: Not Currently    Birth control/protection: Surgical  Other Topics Concern  . Not on file  Social History Narrative  . Not on file   Social Determinants of Health   Financial Resource Strain: Low Risk   . Difficulty of Paying Living Expenses: Not hard at all  Food Insecurity: No Food Insecurity  . Worried About Programme researcher, broadcasting/film/video in the Last Year: Never true  . Ran Out of Food in the Last Year: Never true  Transportation Needs: No Transportation Needs  . Lack of Transportation (Medical): No  . Lack  of Transportation (Non-Medical): No  Physical Activity: Sufficiently Active  . Days of Exercise per Week: 7 days  . Minutes of Exercise per Session: 60 min  Stress: No Stress Concern Present  . Feeling of Stress : Not at all  Social Connections:   . Frequency of Communication with Friends and Family:   . Frequency of Social Gatherings with Friends and Family:   . Attends Religious Services:   . Active Member of Clubs or Organizations:   . Attends Banker Meetings:   Marland Kitchen Marital Status:     Outpatient Encounter Medications as of 11/14/2019  Medication Sig  . albuterol (VENTOLIN HFA) 108 (90 Base) MCG/ACT inhaler Inhale 1-2 puffs into the  lungs every 6 (six) hours as needed for wheezing.  Marland Kitchen aspirin 81 MG tablet Take 81 mg by mouth daily. Takes as needed  . budesonide-formoterol (SYMBICORT) 80-4.5 MCG/ACT inhaler Take 2 puffs first thing in am and then another 2 puffs about 12 hours later. (Patient taking differently: as needed. Take 2 puffs first thing in am and then another 2 puffs about 12 hours later.)  . diclofenac sodium (VOLTAREN) 1 % GEL Apply 2 g to 4 g to affected joint up to 4 times daily PRN.  . hydrochlorothiazide (HYDRODIURIL) 25 MG tablet TAKE 1 TABLET BY MOUTH EVERY DAY  . hydroxychloroquine (PLAQUENIL) 200 MG tablet TAKE 1 TABLET BY MOUTH TWICE A DAY MONDAY THRU FRIDAY  . losartan (COZAAR) 50 MG tablet Take 1 tablet (50 mg total) by mouth every evening.  Marland Kitchen NIFEdipine (PROCARDIA XL/NIFEDICAL XL) 60 MG 24 hr tablet TAKE 1 TABLET BY MOUTH EVERY DAY  . polyethylene glycol (MIRALAX / GLYCOLAX) 17 g packet Take 17 g by mouth daily. (Patient taking differently: Take 17 g by mouth as needed. )  . hyoscyamine (LEVSIN SL) 0.125 MG SL tablet Place 1 tablet (0.125 mg total) under the tongue every 4 (four) hours as needed for cramping. (Patient not taking: Reported on 08/22/2019)  . omeprazole (PRILOSEC) 40 MG capsule TAKE 1 CAPSULE BY MOUTH EVERY DAY (Patient not taking: Reported on 11/14/2019)   No facility-administered encounter medications on file as of 11/14/2019.    Activities of Daily Living In your present state of health, do you have any difficulty performing the following activities: 11/14/2019  Hearing? N  Vision? N  Difficulty concentrating or making decisions? N  Walking or climbing stairs? N  Dressing or bathing? N  Doing errands, shopping? N  Preparing Food and eating ? N  Using the Toilet? N  In the past six months, have you accidently leaked urine? N  Do you have problems with loss of bowel control? N  Managing your Medications? N  Managing your Finances? N  Housekeeping or managing your Housekeeping? N    Some recent data might be hidden    Patient Care Team: Dorothyann Peng, MD as PCP - General (Internal Medicine) Pollyann Savoy, MD as Consulting Physician (Rheumatology) Antony Contras, MD as Consulting Physician (Ophthalmology)    Assessment:   This is a routine wellness examination for [place].  Exercise Activities and Dietary recommendations Current Exercise Habits: Home exercise routine, Type of exercise: walking, Time (Minutes): 60, Frequency (Times/Week): 7, Weekly Exercise (Minutes/Week): 420  Goals    . Patient Stated (pt-stated)     Take time for self. Eat healthier and go to the gym    . Patient Stated     11/14/2019, wants to get healthier       Fall Risk  Fall Risk  11/14/2019 05/23/2019 04/11/2019 11/02/2018 06/06/2018  Falls in the past year? 0 0 0 0 No  Risk for fall due to : Medication side effect - - Medication side effect;Impaired mobility -  Follow up Falls evaluation completed;Education provided;Falls prevention discussed - - Falls prevention discussed;Education provided -   Is the patient's home free of loose throw rugs in walkways, pet beds, electrical cords, etc?   yes      Grab bars in the bathroom? yes      Handrails on the stairs?   yes      Adequate lighting?   yes  Timed Get Up and Go performed: n/a  Depression Screen PHQ 2/9 Scores 11/14/2019 05/23/2019 04/11/2019 11/02/2018  PHQ - 2 Score 0 0 0 0  PHQ- 9 Score 1 - - 0     Cognitive Function     6CIT Screen 11/14/2019 11/02/2018  What Year? 0 points 0 points  What month? 0 points 0 points  What time? 0 points 0 points  Count back from 20 0 points 0 points  Months in reverse 0 points 0 points  Repeat phrase 2 points 2 points  Total Score 2 2    Immunization History  Administered Date(s) Administered  . Influenza, High Dose Seasonal PF 06/21/2017, 05/29/2018, 05/16/2019  . Influenza-Unspecified 05/30/2018, 05/16/2019  . PFIZER SARS-COV-2 Vaccination 10/04/2019, 10/30/2019  . Pneumococcal  Conjugate-13 07/06/2019  . Zoster Recombinat (Shingrix) 05/29/2018, 08/01/2018    Qualifies for Shingles Vaccine? yes  Screening Tests Health Maintenance  Topic Date Due  . PNA vac Low Risk Adult (2 of 2 - PPSV23) 07/05/2020  . TETANUS/TDAP  01/10/2022  . INFLUENZA VACCINE  Completed  . DEXA SCAN  Completed    Cancer Screenings: Lung: Low Dose CT Chest recommended if Age 17-80 years, 30 pack-year currently smoking OR have quit w/in 15years. Patient does not qualify. Breast:  Up to date on Mammogram? Yes   Up to date of Bone Density/Dexa? Yes Colorectal: not required  Additional Screenings: : Hepatitis C Screening: n/a     Plan:    Patient wants to get healthier.   I have personally reviewed and noted the following in the patient's chart:   . Medical and social history . Use of alcohol, tobacco or illicit drugs  . Current medications and supplements . Functional ability and status . Nutritional status . Physical activity . Advanced directives . List of other physicians . Hospitalizations, surgeries, and ER visits in previous 12 months . Vitals . Screenings to include cognitive, depression, and falls . Referrals and appointments  In addition, I have reviewed and discussed with patient certain preventive protocols, quality metrics, and best practice recommendations. A written personalized care plan for preventive services as well as general preventive health recommendations were provided to patient.     Kellie Simmering, LPN  [id number]/61/[id number]

## 2019-11-14 NOTE — Patient Instructions (Signed)
Ms. Huettner , Thank you for taking time to come for your Medicare Wellness Visit. I appreciate your ongoing commitment to your health goals. Please review the following plan we discussed and let me know if I can assist you in the future.   Screening recommendations/referrals: Colonoscopy: not required Mammogram: 03/2019 Bone Density: 07/2018 Recommended yearly ophthalmology/optometry visit for glaucoma screening and checkup Recommended yearly dental visit for hygiene and checkup  Vaccinations: Influenza vaccine: 05/2019 Pneumococcal vaccine: 07/2019 Tdap vaccine: 12/2011 Shingles vaccine: discussed    Advanced directives: Please bring a copy of your POA (Power of [place]) and/or Living Will to your next appointment.    Conditions/risks identified: obesity  Next appointment:    Preventive Care 65 Years and Older, Female Preventive care refers to lifestyle choices and visits with your health care provider that can promote health and wellness. What does preventive care include?  A yearly physical exam. This is also called an annual well check.  Dental exams once or twice a year.  Routine eye exams. Ask your health care provider how often you should have your eyes checked.  Personal lifestyle choices, including:  Daily care of your teeth and gums.  Regular physical activity.  Eating a healthy diet.  Avoiding tobacco and drug use.  Limiting alcohol use.  Practicing safe sex.  Taking low-dose aspirin every day.  Taking vitamin and mineral supplements as recommended by your health care provider. What happens during an annual well check? The services and screenings done by your health care provider during your annual well check will depend on your age, overall health, lifestyle risk factors, and family history of disease. Counseling  Your health care provider may ask you questions about your:  Alcohol use.  Tobacco use.  Drug use.  Emotional well-being.  Home  and relationship well-being.  Sexual activity.  Eating habits.  History of falls.  Memory and ability to understand (cognition).  Work and work Astronomer.  Reproductive health. Screening  You may have the following tests or measurements:  Height, weight, and BMI.  Blood pressure.  Lipid and cholesterol levels. These may be checked every 5 years, or more frequently if you are over 17 years old.  Skin check.  Lung cancer screening. You may have this screening every year starting at age 68 if you have a 30-pack-year history of smoking and currently smoke or have quit within the past 15 years.  Fecal occult blood test (FOBT) of the stool. You may have this test every year starting at age 63.  Flexible sigmoidoscopy or colonoscopy. You may have a sigmoidoscopy every 5 years or a colonoscopy every 10 years starting at age 44.  Hepatitis C blood test.  Hepatitis B blood test.  Sexually transmitted disease (STD) testing.  Diabetes screening. This is done by checking your blood sugar (glucose) after you have not eaten for a while (fasting). You may have this done every 1-3 years.  Bone density scan. This is done to screen for osteoporosis. You may have this done starting at age 21.  Mammogram. This may be done every 1-2 years. Talk to your health care provider about how often you should have regular mammograms. Talk with your health care provider about your test results, treatment options, and if necessary, the need for more tests. Vaccines  Your health care provider may recommend certain vaccines, such as:  Influenza vaccine. This is recommended every year.  Tetanus, diphtheria, and acellular pertussis (Tdap, Td) vaccine. You may need a Td booster every  10 years.  Zoster vaccine. You may need this after age 108.  Pneumococcal 13-valent conjugate (PCV13) vaccine. One dose is recommended after age 23.  Pneumococcal polysaccharide (PPSV23) vaccine. One dose is recommended  after age 17. Talk to your health care provider about which screenings and vaccines you need and how often you need them. This information is not intended to replace advice given to you by your health care provider. Make sure you discuss any questions you have with your health care provider. Document Released: 09/12/2015 Document Revised: 05/05/2016 Document Reviewed: 06/17/2015 Elsevier Interactive Patient Education  2017 [place] Prevention in the Home Falls can cause injuries. They can happen to people of all ages. There are many things you can do to make your home safe and to help prevent falls. What can I do on the outside of my home?  Regularly fix the edges of walkways and driveways and fix any cracks.  Remove anything that might make you trip as you walk through a door, such as a raised step or threshold.  Trim any bushes or trees on the path to your home.  Use bright outdoor lighting.  Clear any walking paths of anything that might make someone trip, such as rocks or tools.  Regularly check to see if handrails are loose or broken. Make sure that both sides of any steps have handrails.  Any raised decks and porches should have guardrails on the edges.  Have any leaves, snow, or ice cleared regularly.  Use sand or salt on walking paths during winter.  Clean up any spills in your garage right away. This includes oil or grease spills. What can I do in the bathroom?  Use night lights.  Install grab bars by the toilet and in the tub and shower. Do not use towel bars as grab bars.  Use non-skid mats or decals in the tub or shower.  If you need to sit down in the shower, use a plastic, non-slip stool.  Keep the floor dry. Clean up any water that spills on the floor as soon as it happens.  Remove soap buildup in the tub or shower regularly.  Attach bath mats securely with double-sided non-slip rug tape.  Do not have throw rugs and other things on the floor  that can make you trip. What can I do in the bedroom?  Use night lights.  Make sure that you have a light by your bed that is easy to reach.  Do not use any sheets or blankets that are too big for your bed. They should not hang down onto the floor.  Have a firm chair that has side arms. You can use this for support while you get dressed.  Do not have throw rugs and other things on the floor that can make you trip. What can I do in the kitchen?  Clean up any spills right away.  Avoid walking on wet floors.  Keep items that you use a lot in easy-to-reach places.  If you need to reach something above you, use a strong step stool that has a grab bar.  Keep electrical cords out of the way.  Do not use floor polish or wax that makes floors slippery. If you must use wax, use non-skid floor wax.  Do not have throw rugs and other things on the floor that can make you trip. What can I do with my stairs?  Do not leave any items on the stairs.  Make sure  that there are handrails on both sides of the stairs and use them. Fix handrails that are broken or loose. Make sure that handrails are as long as the stairways.  Check any carpeting to make sure that it is firmly attached to the stairs. Fix any carpet that is loose or worn.  Avoid having throw rugs at the top or bottom of the stairs. If you do have throw rugs, attach them to the floor with carpet tape.  Make sure that you have a light switch at the top of the stairs and the bottom of the stairs. If you do not have them, ask someone to add them for you. What else can I do to help prevent falls?  Wear shoes that:  Do not have high heels.  Have rubber bottoms.  Are comfortable and fit you well.  Are closed at the toe. Do not wear sandals.  If you use a stepladder:  Make sure that it is fully opened. Do not climb a closed stepladder.  Make sure that both sides of the stepladder are locked into place.  Ask someone to hold it  for you, if possible.  Clearly mark and make sure that you can see:  Any grab bars or handrails.  First and last steps.  Where the edge of each step is.  Use tools that help you move around (mobility aids) if they are needed. These include:  Canes.  Walkers.  Scooters.  Crutches.  Turn on the lights when you go into a dark area. Replace any light bulbs as soon as they burn out.  Set up your furniture so you have a clear path. Avoid moving your furniture around.  If any of your floors are uneven, fix them.  If there are any pets around you, be aware of where they are.  Review your medicines with your doctor. Some medicines can make you feel dizzy. This can increase your chance of falling. Ask your doctor what other things that you can do to help prevent falls. This information is not intended to replace advice given to you by your health care provider. Make sure you discuss any questions you have with your health care provider. Document Released: 06/12/2009 Document Revised: 01/22/2016 Document Reviewed: 09/20/2014 Elsevier Interactive Patient Education  2017 [employer].

## 2019-11-14 NOTE — Progress Notes (Signed)
This visit occurred during the SARS-CoV-2 public health emergency.  Safety protocols were in place, including screening questions prior to the visit, additional usage of staff PPE, and extensive cleaning of exam room while observing appropriate contact time as indicated for disinfecting solutions.  Subjective:     Patient ID: Jessica Booker , female    DOB: 1941/03/10 , 79 y.o.   MRN: [id number]   Chief Complaint  Patient presents with  . Annual Exam  . Hypertension    HPI  She is here today for a full physical examination. She is no longer followed by GYN. She reports compliance with meds.   Hypertension This is a chronic problem. The problem has been gradually improving since onset. The problem is controlled. Pertinent negatives include no chest pain, headaches or palpitations. The current treatment provides moderate improvement. Compliance problems include exercise.      Past Medical History:  Diagnosis Date  . Asthma   . Hypertension   . Lupus ([place])      Family History  Problem Relation Age of Onset  . Heart attack Mother   . Stomach cancer Father   . Hypertension Sister   . Heart disease Sister      Current Outpatient Medications:  .  albuterol (VENTOLIN HFA) 108 (90 Base) MCG/ACT inhaler, Inhale 1-2 puffs into the lungs every 6 (six) hours as needed for wheezing., Disp: 18 g, Rfl: 3 .  aspirin 81 MG tablet, Take 81 mg by mouth daily. Takes as needed, Disp: , Rfl:  .  budesonide-formoterol (SYMBICORT) 80-4.5 MCG/ACT inhaler, Take 2 puffs first thing in am and then another 2 puffs about 12 hours later. (Patient taking differently: as needed. Take 2 puffs first thing in am and then another 2 puffs about 12 hours later.), Disp: 1 Inhaler, Rfl: 111 .  diclofenac sodium (VOLTAREN) 1 % GEL, Apply 2 g to 4 g to affected joint up to 4 times daily PRN., Disp: 4 Tube, Rfl: 2 .  hydrochlorothiazide (HYDRODIURIL) 25 MG tablet, TAKE 1 TABLET BY MOUTH EVERY DAY, Disp: 90 tablet,  Rfl: 1 .  hydroxychloroquine (PLAQUENIL) 200 MG tablet, TAKE 1 TABLET BY MOUTH TWICE A DAY MONDAY THRU FRIDAY, Disp: 120 tablet, Rfl: 0 .  hyoscyamine (LEVSIN SL) 0.125 MG SL tablet, Place 1 tablet (0.125 mg total) under the tongue every 4 (four) hours as needed for cramping. (Patient not taking: Reported on 08/22/2019), Disp: 10 tablet, Rfl: 0 .  losartan (COZAAR) 50 MG tablet, Take 1 tablet (50 mg total) by mouth every evening., Disp: 90 tablet, Rfl: 3 .  NIFEdipine (PROCARDIA XL/NIFEDICAL XL) 60 MG 24 hr tablet, TAKE 1 TABLET BY MOUTH EVERY DAY, Disp: 90 tablet, Rfl: 1 .  omeprazole (PRILOSEC) 40 MG capsule, TAKE 1 CAPSULE BY MOUTH EVERY DAY (Patient not taking: Reported on 11/14/2019), Disp: 90 capsule, Rfl: 1 .  polyethylene glycol (MIRALAX / GLYCOLAX) 17 g packet, Take 17 g by mouth daily. (Patient taking differently: Take 17 g by mouth as needed. ), Disp: 14 each, Rfl: 0   No Known Allergies    The patient states she uses post menopausal status for birth control. Last LMP was No LMP recorded. Patient is postmenopausal.. Negative for DysmenorrheaNegative for: breast discharge, breast lump(s), breast pain and breast self exam. Associated symptoms include abnormal vaginal bleeding. Pertinent negatives include abnormal bleeding (hematology), anxiety, decreased libido, depression, difficulty falling sleep, dyspareunia, history of infertility, nocturia, sexual dysfunction, sleep disturbances, urinary incontinence, urinary urgency, vaginal discharge and vaginal  itching. Diet regular.The patient states her exercise level is  intermittent.   . The patient's tobacco use is:  Social History   Tobacco Use  Smoking Status Never Smoker  Smokeless Tobacco Never Used  . She has been exposed to passive smoke. The patient's alcohol use is:  Social History   Substance and Sexual Activity  Alcohol Use No   Review of Systems  Constitutional: Negative.   HENT: Positive for postnasal drip.   Eyes:  Negative.   Respiratory: Negative.   Cardiovascular: Negative.  Negative for chest pain and palpitations.  Endocrine: Negative.   Genitourinary: Negative.   Musculoskeletal: Negative.   Skin: Negative.   Allergic/Immunologic: Negative.   Neurological: Negative.  Negative for headaches.  Hematological: Negative.   Psychiatric/Behavioral: Negative.      Today's Vitals   11/14/19 1432  BP: 126/64  Pulse: 80  Temp: 98.5 F (36.9 C)  TempSrc: Oral  Weight: 166 lb 9.6 oz (75.6 kg)  Height: 5' 1.4" (1.56 m)  PainSc: 0-No pain   Body mass index is 31.07 kg/m.   Objective:  Physical Exam Vitals and nursing note reviewed.  Constitutional:      Appearance: Normal appearance.  HENT:     Head: Normocephalic and atraumatic.     Right Ear: Tympanic membrane, ear canal and external ear normal.     Left Ear: Tympanic membrane, ear canal and external ear normal.     Nose:     Comments: Deferred, masked    Mouth/Throat:     Comments: Deferred, masked Eyes:     Extraocular Movements: Extraocular movements intact.     Conjunctiva/sclera: Conjunctivae normal.     Pupils: Pupils are equal, round, and reactive to light.  Cardiovascular:     Rate and Rhythm: Normal rate and regular rhythm.     Pulses: Normal pulses.     Heart sounds: Normal heart sounds.  Pulmonary:     Effort: Pulmonary effort is normal.     Breath sounds: Normal breath sounds.  Chest:     Breasts: Tanner Score is 5.        Right: Normal.        Left: Normal.  Abdominal:     General: Bowel sounds are normal.     Palpations: Abdomen is soft.     Comments: Rounded, soft.   Genitourinary:    Comments: deferred Musculoskeletal:        General: Normal range of motion.     Cervical back: Normal range of motion and neck supple.  Skin:    General: Skin is warm and dry.  Neurological:     General: No focal deficit present.     Mental Status: She is alert and oriented to person, place, and time.  Psychiatric:         Mood and Affect: Mood normal.        Behavior: Behavior normal.         Assessment And Plan:   1. Routine general medical examination at health care facility  A full exam was performed.  Importance of monthly self breast exams was discussed with the patient. PATIENT IS ADVISED TO GET 30-45 MINUTES REGULAR EXERCISE NO LESS THAN FOUR TO FIVE DAYS PER WEEK - BOTH WEIGHTBEARING EXERCISES AND AEROBIC ARE RECOMMENDED.  SHE IS ADVISED TO FOLLOW A HEALTHY DIET WITH AT LEAST SIX FRUITS/VEGGIES PER DAY, DECREASE INTAKE OF RED MEAT, AND TO INCREASE FISH INTAKE TO TWO DAYS PER WEEK.  MEATS/FISH SHOULD NOT BE  FRIED, BAKED OR BROILED IS PREFERABLE.  I SUGGEST WEARING SPF 50 SUNSCREEN ON EXPOSED PARTS AND ESPECIALLY WHEN IN THE DIRECT SUNLIGHT FOR AN EXTENDED PERIOD OF TIME.  PLEASE AVOID FAST FOOD RESTAURANTS AND INCREASE YOUR WATER INTAKE.   2. Essential hypertension  Chronic, well controlled.  She will continue with current meds. She is encouraged to avoid adding salt to her foods. EKG performed, no new changes noted - NSR w/o acute changes. She will rto in six months for re-evaluation.   - EKG 12-Lead - CBC with Diff - CMP14+EGFR - Lipid panel - TSH  3. 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 Standing orders for Rheum drawn and results will be sent to Dr. Estanislado Pandy.   4. Postnasal drip  She is encouraged to cut back on dairy intake and rx loratadine 69m daily was sent to the pharmacy.   5. Class 1 obesity due to excess calories with serious comorbidity and body mass index (BMI) of 31.0 to 31.9 in adult  She is encouraged to strive for BMI less than 28 to decrease cardiac risk. She is encouraged to walk 30 minutes five days per week.    RMaximino Greenland MD    THE PATIENT IS ENCOURAGED TO PRACTICE SOCIAL DISTANCING DUE TO THE COVID-19 PANDEMIC.

## 2019-11-14 NOTE — Patient Instructions (Signed)
Health Maintenance, Female Adopting a healthy lifestyle and getting preventive care are important in promoting health and wellness. Ask your health care provider about:  The right schedule for you to have regular tests and exams.  Things you can do on your own to prevent diseases and keep yourself healthy. What should I know about diet, weight, and exercise? Eat a healthy diet   Eat a diet that includes plenty of vegetables, fruits, low-fat dairy products, and lean protein.  Do not eat a lot of foods that are high in solid fats, added sugars, or sodium. Maintain a healthy weight Body mass index (BMI) is used to identify weight problems. It estimates body fat based on height and weight. Your health care provider can help determine your BMI and help you achieve or maintain a healthy weight. Get regular exercise Get regular exercise. This is one of the most important things you can do for your health. Most adults should:  Exercise for at least 150 minutes each week. The exercise should increase your heart rate and make you sweat (moderate-intensity exercise).  Do strengthening exercises at least twice a week. This is in addition to the moderate-intensity exercise.  Spend less time sitting. Even light physical activity can be beneficial. Watch cholesterol and blood lipids Have your blood tested for lipids and cholesterol at 79 years of age, then have this test every 5 years. Have your cholesterol levels checked more often if:  Your lipid or cholesterol levels are high.  You are older than 79 years of age.  You are at high risk for heart disease. What should I know about cancer screening? Depending on your health history and family history, you may need to have cancer screening at various ages. This may include screening for:  Breast cancer.  Cervical cancer.  Colorectal cancer.  Skin cancer.  Lung cancer. What should I know about heart disease, diabetes, and high blood  pressure? Blood pressure and heart disease  High blood pressure causes heart disease and increases the risk of stroke. This is more likely to develop in people who have high blood pressure readings, are of African descent, or are overweight.  Have your blood pressure checked: ? Every 3-5 years if you are 18-39 years of age. ? Every year if you are 40 years old or older. Diabetes Have regular diabetes screenings. This checks your fasting blood sugar level. Have the screening done:  Once every three years after age 40 if you are at a normal weight and have a low risk for diabetes.  More often and at a younger age if you are overweight or have a high risk for diabetes. What should I know about preventing infection? Hepatitis B If you have a higher risk for hepatitis B, you should be screened for this virus. Talk with your health care provider to find out if you are at risk for hepatitis B infection. Hepatitis C Testing is recommended for:  Everyone born from 1945 through 1965.  Anyone with known risk factors for hepatitis C. Sexually transmitted infections (STIs)  Get screened for STIs, including gonorrhea and chlamydia, if: ? You are sexually active and are younger than 79 years of age. ? You are older than 79 years of age and your health care provider tells you that you are at risk for this type of infection. ? Your sexual activity has changed since you were last screened, and you are at increased risk for chlamydia or gonorrhea. Ask your health care provider if   you are at risk.  Ask your health care provider about whether you are at high risk for HIV. Your health care provider may recommend a prescription medicine to help prevent HIV infection. If you choose to take medicine to prevent HIV, you should first get tested for HIV. You should then be tested every 3 months for as long as you are taking the medicine. Pregnancy  If you are about to stop having your period (premenopausal) and  you may become pregnant, seek counseling before you get pregnant.  Take 400 to 800 micrograms (mcg) of folic acid every day if you become pregnant.  Ask for birth control (contraception) if you want to prevent pregnancy. Osteoporosis and menopause Osteoporosis is a disease in which the bones lose minerals and strength with aging. This can result in bone fractures. If you are 65 years old or older, or if you are at risk for osteoporosis and fractures, ask your health care provider if you should:  Be screened for bone loss.  Take a calcium or vitamin D supplement to lower your risk of fractures.  Be given hormone replacement therapy (HRT) to treat symptoms of menopause. Follow these instructions at home: Lifestyle  Do not use any products that contain nicotine or tobacco, such as cigarettes, e-cigarettes, and chewing tobacco. If you need help quitting, ask your health care provider.  Do not use street drugs.  Do not share needles.  Ask your health care provider for help if you need support or information about quitting drugs. Alcohol use  Do not drink alcohol if: ? Your health care provider tells you not to drink. ? You are pregnant, may be pregnant, or are planning to become pregnant.  If you drink alcohol: ? Limit how much you use to 0-1 drink a day. ? Limit intake if you are breastfeeding.  Be aware of how much alcohol is in your drink. In the [place] one drink equals one 12 oz bottle of beer (355 mL), one 5 oz glass of wine (148 mL), or one 1 oz glass of hard liquor (44 mL). General instructions  Schedule regular health, dental, and eye exams.  Stay current with your vaccines.  Tell your health care provider if: ? You often feel depressed. ? You have ever been abused or do not feel safe at home. Summary  Adopting a healthy lifestyle and getting preventive care are important in promoting health and wellness.  Follow your health care provider's instructions about healthy  diet, exercising, and getting tested or screened for diseases.  Follow your health care provider's instructions on monitoring your cholesterol and blood pressure. This information is not intended to replace advice given to you by your health care provider. Make sure you discuss any questions you have with your health care provider. Document Revised: 08/09/2018 Document Reviewed: 08/09/2018 Elsevier Patient Education  2020 [employer]  

## 2019-11-15 LAB — CBC WITH DIFFERENTIAL/PLATELET
Basophils Absolute: 0 10*3/uL (ref 0.0–0.2)
Basos: 0 %
EOS (ABSOLUTE): 0.1 10*3/uL (ref 0.0–0.4)
Eos: 1 %
Hematocrit: 36 % (ref 34.0–46.6)
Hemoglobin: 12 g/dL (ref 11.1–15.9)
Immature Grans (Abs): 0 10*3/uL (ref 0.0–0.1)
Immature Granulocytes: 0 %
Lymphocytes Absolute: 2.3 10*3/uL (ref 0.7–3.1)
Lymphs: 32 %
MCH: 27.1 pg (ref 26.6–33.0)
MCHC: 33.3 g/dL (ref 31.5–35.7)
MCV: 81 fL (ref 79–97)
Monocytes Absolute: 0.6 10*3/uL (ref 0.1–0.9)
Monocytes: 9 %
Neutrophils Absolute: 4.2 10*3/uL (ref 1.4–7.0)
Neutrophils: 58 %
Platelets: 277 10*3/uL (ref 150–450)
RBC: 4.42 x10E6/uL (ref 3.77–5.28)
RDW: 13.1 % (ref 11.7–15.4)
WBC: 7.3 10*3/uL (ref 3.4–10.8)

## 2019-11-15 LAB — CMP14+EGFR
ALT: 14 IU/L (ref 0–32)
AST: 21 IU/L (ref 0–40)
Albumin/Globulin Ratio: 1.5 (ref 1.2–2.2)
Albumin: 4.5 g/dL (ref 3.7–4.7)
Alkaline Phosphatase: 104 IU/L (ref 39–117)
BUN/Creatinine Ratio: 17 (ref 12–28)
BUN: 19 mg/dL (ref 8–27)
Bilirubin Total: 0.5 mg/dL (ref 0.0–1.2)
CO2: 24 mmol/L (ref 20–29)
Calcium: 9.9 mg/dL (ref 8.7–10.3)
Chloride: 99 mmol/L (ref 96–106)
Creatinine, Ser: 1.11 mg/dL — ABNORMAL HIGH (ref 0.57–1.00)
GFR calc Af Amer: 55 mL/min/{1.73_m2} — ABNORMAL LOW (ref >59)
GFR calc non Af Amer: 48 mL/min/{1.73_m2} — ABNORMAL LOW (ref >59)
Globulin, Total: 3.1 g/dL (ref 1.5–4.5)
Glucose: 75 mg/dL (ref 65–99)
Potassium: 4.1 mmol/L (ref 3.5–5.2)
Sodium: 138 mmol/L (ref 134–144)
Total Protein: 7.6 g/dL (ref 6.0–8.5)

## 2019-11-15 LAB — LIPID PANEL
Chol/HDL Ratio: 2.6 ratio (ref 0.0–4.4)
Cholesterol, Total: 174 mg/dL (ref 100–199)
HDL: 66 mg/dL (ref >39)
LDL Chol Calc (NIH): 98 mg/dL (ref 0–99)
Triglycerides: 49 mg/dL (ref 0–149)
VLDL Cholesterol Cal: 10 mg/dL (ref 5–40)

## 2019-11-15 LAB — TSH: TSH: 0.983 u[IU]/mL (ref 0.450–4.500)

## 2019-11-15 NOTE — Progress Notes (Signed)
Patient is on diuretics.  Creatinine is mildly elevated most likely due to diuretic use.  Rest the labs are stable.  Please forward labs to her PCP.

## 2019-11-20 ENCOUNTER — Telehealth: Payer: Self-pay

## 2019-11-20 NOTE — Telephone Encounter (Signed)
-----   Message from Dorothyann Peng, MD sent at 11/17/2019  9:26 PM EDT ----- Here are your lab results:  Your blood count is normal.  Kidney fxn has decreased. Be sure to stay well hydrated. How much water do you drink per day?   Cholesterol looks great. Thyroid fxn is normal.   Please let me know if you have any questions or concerns. Stay safe!   Sincerely,    Robyn N. Allyne Gee, MD

## 2019-11-20 NOTE — Telephone Encounter (Signed)
Left vm for pt to return call for lab results  

## 2019-12-07 ENCOUNTER — Other Ambulatory Visit: Payer: Self-pay | Admitting: Internal Medicine

## 2020-01-09 ENCOUNTER — Other Ambulatory Visit: Payer: Self-pay | Admitting: Internal Medicine

## 2020-01-11 ENCOUNTER — Encounter: Payer: Self-pay | Admitting: Rheumatology

## 2020-01-11 DIAGNOSIS — Z79899 Other long term (current) drug therapy: Secondary | ICD-10-CM | POA: Diagnosis not present

## 2020-01-11 DIAGNOSIS — Z961 Presence of intraocular lens: Secondary | ICD-10-CM | POA: Diagnosis not present

## 2020-01-11 DIAGNOSIS — H25012 Cortical age-related cataract, left eye: Secondary | ICD-10-CM | POA: Diagnosis not present

## 2020-01-11 DIAGNOSIS — H52203 Unspecified astigmatism, bilateral: Secondary | ICD-10-CM | POA: Diagnosis not present

## 2020-01-14 ENCOUNTER — Other Ambulatory Visit: Payer: Self-pay | Admitting: Internal Medicine

## 2020-02-27 ENCOUNTER — Ambulatory Visit: Payer: Medicare PPO | Admitting: Internal Medicine

## 2020-03-11 ENCOUNTER — Other Ambulatory Visit: Payer: Self-pay | Admitting: Internal Medicine

## 2020-03-11 DIAGNOSIS — Z1231 Encounter for screening mammogram for malignant neoplasm of breast: Secondary | ICD-10-CM

## 2020-04-07 ENCOUNTER — Other Ambulatory Visit: Payer: Self-pay | Admitting: Internal Medicine

## 2020-04-07 ENCOUNTER — Other Ambulatory Visit: Payer: Self-pay | Admitting: Cardiology

## 2020-04-07 DIAGNOSIS — I10 Essential (primary) hypertension: Secondary | ICD-10-CM

## 2020-04-08 ENCOUNTER — Other Ambulatory Visit: Payer: Self-pay | Admitting: Rheumatology

## 2020-04-08 DIAGNOSIS — M3219 Other organ or system involvement in systemic lupus erythematosus: Secondary | ICD-10-CM

## 2020-04-17 ENCOUNTER — Ambulatory Visit
Admission: RE | Admit: 2020-04-17 | Discharge: 2020-04-17 | Disposition: A | Discharge status: Home / Self Care | Payer: Medicare PPO | Source: Ambulatory Visit | Attending: Internal Medicine | Admitting: Internal Medicine

## 2020-04-17 DIAGNOSIS — Z1231 Encounter for screening mammogram for malignant neoplasm of breast: Secondary | ICD-10-CM | POA: Diagnosis not present

## 2020-04-21 ENCOUNTER — Encounter: Payer: Self-pay | Admitting: Internal Medicine

## 2020-04-21 ENCOUNTER — Ambulatory Visit (INDEPENDENT_AMBULATORY_CARE_PROVIDER_SITE_OTHER): Payer: Medicare PPO | Admitting: Internal Medicine

## 2020-04-21 ENCOUNTER — Other Ambulatory Visit: Payer: Self-pay

## 2020-04-21 VITALS — BP 130/79 | HR 55 | Temp 98.1°F | Ht 61.4 in | Wt 165.8 lb

## 2020-04-21 DIAGNOSIS — R1032 Left lower quadrant pain: Secondary | ICD-10-CM

## 2020-04-21 DIAGNOSIS — E6609 Other obesity due to excess calories: Secondary | ICD-10-CM | POA: Diagnosis not present

## 2020-04-21 DIAGNOSIS — Z1159 Encounter for screening for other viral diseases: Secondary | ICD-10-CM

## 2020-04-21 DIAGNOSIS — Z6830 Body mass index (BMI) 30.0-30.9, adult: Secondary | ICD-10-CM | POA: Diagnosis not present

## 2020-04-21 DIAGNOSIS — R0982 Postnasal drip: Secondary | ICD-10-CM | POA: Diagnosis not present

## 2020-04-21 DIAGNOSIS — I10 Essential (primary) hypertension: Secondary | ICD-10-CM

## 2020-04-21 DIAGNOSIS — M542 Cervicalgia: Secondary | ICD-10-CM

## 2020-04-21 DIAGNOSIS — N289 Disorder of kidney and ureter, unspecified: Secondary | ICD-10-CM

## 2020-04-21 NOTE — Progress Notes (Signed)
This visit occurred during the SARS-CoV-2 public health emergency.  Safety protocols were in place, including screening questions prior to the visit, additional usage of staff PPE, and extensive cleaning of exam room while observing appropriate contact time as indicated for disinfecting solutions.  Subjective:     Patient ID: Jessica Booker , female    DOB: 04/26/1941 , 79 y.o.   MRN: [id number]   Chief Complaint  Patient presents with  . Hypertension    HPI  She presents for bp check. Wants to have her kidney function checked.   Hypertension This is a chronic problem. The current episode started more than 1 year ago. The problem has been gradually improving since onset. The problem is controlled. Associated symptoms include anxiety and neck pain (c/o neck pain. Denies UEweakness/tingling. Muscles feel tight. ). Pertinent negatives include no headaches, palpitations or peripheral edema. Risk factors for coronary artery disease include sedentary lifestyle. Past treatments include diuretics. There are no compliance problems.  There is no history of angina. There is no history of chronic renal disease.     Past Medical History:  Diagnosis Date  . Asthma   . Hypertension   . Lupus ([place])      Family History  Problem Relation Age of Onset  . Heart attack Mother   . Stomach cancer Father   . Hypertension Sister   . Heart disease Sister      Current Outpatient Medications:  .  albuterol (VENTOLIN HFA) 108 (90 Base) MCG/ACT inhaler, Inhale 1-2 puffs into the lungs every 6 (six) hours as needed for wheezing., Disp: 18 g, Rfl: 3 .  aspirin 81 MG tablet, Take 81 mg by mouth daily. Takes as needed, Disp: , Rfl:  .  budesonide-formoterol (SYMBICORT) 80-4.5 MCG/ACT inhaler, Take 2 puffs first thing in am and then another 2 puffs about 12 hours later. (Patient taking differently: as needed. Take 2 puffs first thing in am and then another 2 puffs about 12 hours later.), Disp: 1 Inhaler, Rfl:  111 .  diclofenac sodium (VOLTAREN) 1 % GEL, Apply 2 g to 4 g to affected joint up to 4 times daily PRN., Disp: 4 Tube, Rfl: 2 .  hydrochlorothiazide (HYDRODIURIL) 25 MG tablet, TAKE 1 TABLET BY MOUTH EVERY DAY, Disp: 90 tablet, Rfl: 1 .  hydroxychloroquine (PLAQUENIL) 200 MG tablet, TAKE 1 TABLET BY MOUTH TWICE A DAY MONDAY THRU FRIDAY, Disp: 120 tablet, Rfl: 0 .  losartan (COZAAR) 50 MG tablet, TAKE 1 TABLET BY MOUTH EVERY EVENING, Disp: 90 tablet, Rfl: 3 .  NIFEdipine (PROCARDIA XL/NIFEDICAL XL) 60 MG 24 hr tablet, TAKE 1 TABLET BY MOUTH EVERY DAY, Disp: 90 tablet, Rfl: 1 .  polyethylene glycol (MIRALAX / GLYCOLAX) 17 g packet, Take 17 g by mouth daily. (Patient taking differently: Take 17 g by mouth as needed. ), Disp: 14 each, Rfl: 0 .  hyoscyamine (LEVSIN SL) 0.125 MG SL tablet, Place 1 tablet (0.125 mg total) under the tongue every 4 (four) hours as needed for cramping. (Patient not taking: Reported on 08/22/2019), Disp: 10 tablet, Rfl: 0 .  loratadine (CLARITIN) 10 MG tablet, TAKE 1 TABLET BY MOUTH EVERY DAY (Patient not taking: Reported on 04/21/2020), Disp: 30 tablet, Rfl: 1 .  omeprazole (PRILOSEC) 40 MG capsule, TAKE 1 CAPSULE BY MOUTH EVERY DAY (Patient not taking: Reported on 11/14/2019), Disp: 90 capsule, Rfl: 1   No Known Allergies   Review of Systems  Constitutional: Negative.   HENT:  She feels like there is a lump in her throat. She denies having sore throat, fever or chills. No pain with swallowing. Denies hoarseness. Has to clear her throat upon awakening.   Respiratory: Negative.   Cardiovascular: Negative for palpitations.  Gastrointestinal: Positive for abdominal pain.       She c/o lower abdominal pain. Not sure what is contributing to her sx. NO n/v/d. Does not have bowel movement daily. No BRBPR. Unable to state what triggers her sx.   Musculoskeletal: Positive for neck pain (c/o neck pain. Denies UEweakness/tingling. Muscles feel tight. ).  Neurological: Negative.   Negative for headaches.  Psychiatric/Behavioral: Negative.      Today's Vitals   04/21/20 1448  BP: 130/79  Pulse: (!) 55  Temp: 98.1 F (36.7 C)  TempSrc: Oral  Weight: 165 lb 12.8 oz (75.2 kg)  Height: 5' 1.4" (1.56 m)  PainSc: 0-No pain   Body mass index is 30.92 kg/m.   Objective:  Physical Exam Vitals and nursing note reviewed.  Constitutional:      Appearance: Normal appearance.  HENT:     Head: Normocephalic and atraumatic.  Cardiovascular:     Rate and Rhythm: Normal rate and regular rhythm.     Heart sounds: Normal heart sounds.  Pulmonary:     Effort: Pulmonary effort is normal.     Breath sounds: Normal breath sounds.  Abdominal:     General: Bowel sounds are normal.     Palpations: Abdomen is soft.  Musculoskeletal:     Cervical back: Normal range of motion. Tenderness present.  Skin:    General: Skin is warm.  Neurological:     General: No focal deficit present.     Mental Status: She is alert.  Psychiatric:        Mood and Affect: Mood normal.        Behavior: Behavior normal.         Assessment And Plan:     1. Essential hypertension Comments: Chronic, fair control. Encouraged to aim for at least 30 minutes of exercise four to five days per week. She will continue with current meds for now.   2. Renal insufficiency Comments: Previous labs reviewed. Encouraged to stay well hydrated. I will check repeat BMP today.  - BMP8+EGFR  3. Postnasal drip Comments: Advised to resume loratadine. If persistent, will refer to ENT for further evaluation.   4. Cervicalgia Comments: Advised to apply muscle rub to b/l shoulders nightly prn. Also advised to perform stretching exercises. She will let me know if her sx persist.   5. LLQ pain Comments: Possibly due to constipation.Exam is unrevealing.  Advised to try Miralax. She will let me know if her sx persist.   6. Class 1 obesity due to excess calories with serious comorbidity and body mass index (BMI)  of 30.0 to 30.9 in adult Comments: Her BMI is acceptable for her demographic. Encouraged to aim for at least 30 minutes of exercise five days weekly.   7. Encounter for HCV screening test for low risk patient Comments: I will check HCV antibody.  - Hepatitis C antibody     Patient was given opportunity to ask questions. Patient verbalized understanding of the plan and was able to repeat key elements of the plan. All questions were answered to their satisfaction.  Maximino Greenland, MD   I, Maximino Greenland, MD, have reviewed all documentation for this visit. The documentation on 05/10/20 for the exam, diagnosis, procedures, and orders are  all accurate and complete.  THE PATIENT IS ENCOURAGED TO PRACTICE SOCIAL DISTANCING DUE TO THE COVID-19 PANDEMIC.

## 2020-04-22 LAB — BMP8+EGFR
BUN/Creatinine Ratio: 16 (ref 12–28)
BUN: 12 mg/dL (ref 8–27)
CO2: 28 mmol/L (ref 20–29)
Calcium: 9.9 mg/dL (ref 8.7–10.3)
Chloride: 102 mmol/L (ref 96–106)
Creatinine, Ser: 0.75 mg/dL (ref 0.57–1.00)
GFR calc Af Amer: 88 mL/min/{1.73_m2} (ref >59)
GFR calc non Af Amer: 76 mL/min/{1.73_m2} (ref >59)
Glucose: 78 mg/dL (ref 65–99)
Potassium: 4.2 mmol/L (ref 3.5–5.2)
Sodium: 139 mmol/L (ref 134–144)

## 2020-04-22 LAB — HEPATITIS C ANTIBODY: Hep C Virus Ab: <0.1 s/co ratio (ref 0.0–0.9)

## 2020-05-15 DIAGNOSIS — H43811 Vitreous degeneration, right eye: Secondary | ICD-10-CM | POA: Diagnosis not present

## 2020-05-19 ENCOUNTER — Ambulatory Visit: Payer: Medicare PPO | Admitting: Internal Medicine

## 2020-05-28 ENCOUNTER — Telehealth: Payer: Self-pay

## 2020-05-28 NOTE — Telephone Encounter (Signed)
The pt was notified that samples of Linzess is ready for pickup.

## 2020-05-29 ENCOUNTER — Other Ambulatory Visit: Payer: Self-pay

## 2020-05-29 ENCOUNTER — Encounter: Payer: Self-pay | Admitting: Cardiology

## 2020-05-29 ENCOUNTER — Ambulatory Visit: Payer: Medicare Other | Admitting: Cardiology

## 2020-05-29 VITALS — BP 148/65 | HR 70 | Resp 16 | Ht 61.0 in | Wt 164.0 lb

## 2020-05-29 DIAGNOSIS — I10 Essential (primary) hypertension: Secondary | ICD-10-CM | POA: Diagnosis not present

## 2020-05-29 DIAGNOSIS — I340 Nonrheumatic mitral (valve) insufficiency: Secondary | ICD-10-CM

## 2020-05-29 DIAGNOSIS — I351 Nonrheumatic aortic (valve) insufficiency: Secondary | ICD-10-CM

## 2020-05-29 MED ORDER — VALSARTAN-HYDROCHLOROTHIAZIDE 320-12.5 MG PO TABS: 1.0000 | ORAL_TABLET | ORAL | 3 refills | Status: DC

## 2020-05-29 NOTE — Progress Notes (Signed)
Primary Physician:  Dorothyann Peng, MD   Patient ID: Jessica Booker, female    DOB: 1941/07/22, 79 y.o.   MRN: [id number]  Subjective:    Chief Complaint  Patient presents with  . Hypertension  . Aortic and Mitral regurgitation  . Follow-up    1 year     HPI: Jessica Booker  is a 79 y.o. female  with  lupus arthritis, hypertension, mild obesity, hyperglycemia , exercise nuclear stress test in April 2019 that was considered low risk study, moderate aortic regurgitation and MVP with moderate mitral regurgitation presents here for annual visit.  States that since last office visit symptoms of dyspnea has improved and she has not had any further chest pain.  She has been walking at least 1 mile a day.  Denies PND or orthopnea or leg edema.  Her main complaint is neck pain.  Past Medical History:  Diagnosis Date  . Asthma   . Hypertension   . Lupus [facility])     Past Surgical History:  Procedure Laterality Date  . ABDOMINAL HYSTERECTOMY    . CHOLECYSTECTOMY    . SHOULDER ARTHROSCOPY W/ ROTATOR CUFF REPAIR     R shoulder  . TONSILLECTOMY     Social History   Tobacco Use  . Smoking status: Never Smoker  . Smokeless tobacco: Never Used  Substance Use Topics  . Alcohol use: No    Review of Systems  Cardiovascular: Negative for chest pain, dyspnea on exertion and leg swelling.  Musculoskeletal: Positive for back pain, joint pain and neck pain.  Gastrointestinal: Negative for melena.   Objective:  Blood pressure (!) 148/65, pulse 70, resp. rate 16, height 5\' 1"  (1.549 m), weight 164 lb (74.4 kg), SpO2 99 %. Body mass index is 30.99 kg/m.  Vitals with BMI 05/29/2020 04/21/2020 11/14/2019  Height 5\' 1"  5' 1.4" 5' 1.4"  Weight 164 lbs 165 lbs 13 oz 166 lbs 10 oz  BMI 31 30.9 31.05  Systolic 148 130 11/16/2019  Diastolic 65 79 64  Pulse 70 55 80      Physical Exam Vitals reviewed.  Constitutional:      Appearance: She is well-developed.  Cardiovascular:     Rate and Rhythm:  Normal rate and regular rhythm.     Pulses: Normal pulses and intact distal pulses. A midsystolic click.          Carotid pulses are on the right side with bruit and on the left side with bruit.    Heart sounds: Murmur heard.  Crescendo-decrescendo mid to late systolic murmur is present with a grade of 3/6 at the apex.  Harsh early systolic murmur of grade 2/6 is also present at the upper right sternal border.   Pulmonary:     Effort: Pulmonary effort is normal. No accessory muscle usage or respiratory distress.     Breath sounds: Normal breath sounds.  Abdominal:     General: Bowel sounds are normal.     Palpations: Abdomen is soft.  Musculoskeletal:        General: Normal range of motion.     Cervical back: Normal range of motion.   Radiology: No results found.  Laboratory examination:    CMP Latest Ref Rng & Units 04/21/2020 11/14/2019 09/18/2019  Glucose 65 - 99 mg/dL 78 75 78  BUN 8 - 27 mg/dL 12 19 16   Creatinine 0.57 - 1.00 mg/dL 11/16/2019 09/20/2019)  Sodium 134 - 144 mmol/L 139 138 137  Potassium 3.5 -  5.2 mmol/L 4.2 4.1 3.5  Chloride 96 - 106 mmol/L 102 99 100  CO2 20 - 29 mmol/L 28 24 30   Calcium 8.7 - 10.3 mg/dL 9.9 9.9 9.3  Total Protein 6.0 - 8.5 g/dL - 7.6 7.3  Total Bilirubin 0.0 - 1.2 mg/dL - 0.5 0.4  Alkaline Phos 39 - 117 IU/L - 104 -  AST 0 - 40 IU/L - 21 15  ALT 0 - 32 IU/L - 14 9   CBC Latest Ref Rng & Units 11/14/2019 09/18/2019 04/11/2019  WBC 3.4 - 10.8 x10E3/uL 7.3 7.4 6.9  Hemoglobin 11.1 - 15.9 g/dL 06/11/2019 11.5(L) 12.4  Hematocrit 34.0 - 46.6 % 36.0 35.0 38.2  Platelets 150 - 450 x10E3/uL 277 273 305   Lipid Panel Recent Labs    11/14/19 1614  CHOL 174  TRIG 49  LDLCALC 98  HDL 66  CHOLHDL 2.6     HEMOGLOBIN A1C No results found for: HGBA1C, MPG TSH Recent Labs    11/14/19 1614  TSH 0.983    PRN Meds:. Medications Discontinued During This Encounter  Medication Reason  . omeprazole (PRILOSEC) 40 MG capsule Patient Preference  .  hydrochlorothiazide (HYDRODIURIL) 25 MG tablet Change in therapy  . losartan (COZAAR) 50 MG tablet Change in therapy   Current Outpatient Medications on File Prior to Visit  Medication Sig Dispense Refill  . albuterol (VENTOLIN HFA) 108 (90 Base) MCG/ACT inhaler Inhale 1-2 puffs into the lungs every 6 (six) hours as needed for wheezing. 18 g 3  . aspirin 81 MG tablet Take 81 mg by mouth daily. Takes as needed    . budesonide-formoterol (SYMBICORT) 80-4.5 MCG/ACT inhaler Take 2 puffs first thing in am and then another 2 puffs about 12 hours later. (Patient taking differently: as needed. Take 2 puffs first thing in am and then another 2 puffs about 12 hours later.) 1 Inhaler 111  . diclofenac sodium (VOLTAREN) 1 % GEL Apply 2 g to 4 g to affected joint up to 4 times daily PRN. 4 Tube 2  . hydroxychloroquine (PLAQUENIL) 200 MG tablet TAKE 1 TABLET BY MOUTH TWICE A DAY MONDAY THRU FRIDAY 120 tablet 0  . hyoscyamine (LEVSIN SL) 0.125 MG SL tablet Place 1 tablet (0.125 mg total) under the tongue every 4 (four) hours as needed for cramping. 10 tablet 0  . linaclotide (LINZESS) 72 MCG capsule Take 72 mcg by mouth daily before breakfast.    . loratadine (CLARITIN) 10 MG tablet TAKE 1 TABLET BY MOUTH EVERY DAY 30 tablet 1  . NIFEdipine (PROCARDIA XL/NIFEDICAL XL) 60 MG 24 hr tablet TAKE 1 TABLET BY MOUTH EVERY DAY 90 tablet 1  . polyethylene glycol (MIRALAX / GLYCOLAX) 17 g packet Take 17 g by mouth daily. (Patient taking differently: Take 17 g by mouth as needed. ) 14 each 0   No current facility-administered medications on file prior to visit.    Cardiac Studies:   Exercise myoview stress 12/02/2017: 1. The patient performed treadmill exercise using a Bruce protocol, completing 5 minutes. The patient completed an estimated workload of 7.03 METS, reaching 97% of the maximum predicted heart rate. Resting hypertension with exaggerated exercise response. Peak BP 248/48 mmHg. No stress symptoms reported.  Fair exercise capacity. No ischemic changes seen on stress electrocardiogram. Frequent PVC's seen at rest, and during exercise. 2. The overall quality of the study is excellent. There is no evidence of abnormal lung activity. Stress and rest SPECT images demonstrate homogeneous tracer distribution throughout the myocardium.  Gated SPECT imaging reveals normal myocardial thickening and wall motion. The left ventricular ejection fraction was normal (72%).  3. Low risk study.  Carotid artery duplex 12/13/2017: No hemodynamically significant arterial disease in the internal carotid artery bilaterally. There is heterogeneous plaque noted in the right external carotid and carotid bifurcations with less than 50%( minimal) stenosis. Further studies if clinically indicated. No significant change from 07/03/2014.  Echocardiogram 05/09/2019: Left ventricle cavity is normal in size. Mild concentric hypertrophy of the left ventricle. Normal LV systolic function with EF 69%. Normal global wall motion. Doppler evidence of grade I (impaired) diastolic dysfunction, normal LAP.  Trileaflet aortic valve. Mild aortic valve leaflet calcification. Mild aortic valve stenosis. Aortic valve mean gradient of 12 mmHg, Vmax of 2.3 m/s. Calculated aortic valve area by continuity equation is 1.7 cm. Moderate (Grade III) aortic regurgitation. Mild prolapse with moderate (Grade II) mitral regurgitation. Mild tricuspid regurgitation. No evidence of pulmonary hypertension. Compared to previous study on 12/14/2017, aortic regurgitation severity is minimally increased.  EKG:  EKG 05/29/2020: Normal sinus rhythm at rate of 60 bpm, left atrial enlargement, left axis deviation.  Poor R wave progression, cannot exclude anteroseptal infarct old.  No evident ischemia, normal QT interval.  No significant change from 04/20/2019.  Assessment:     ICD-10-CM   1. Essential hypertension  I10 EKG 12-Lead    valsartan-hydrochlorothiazide  (DIOVAN-HCT) 320-12.5 MG tablet  2. Moderate mitral regurgitation  I34.0 PCV ECHOCARDIOGRAM COMPLETE    valsartan-hydrochlorothiazide (DIOVAN-HCT) 320-12.5 MG tablet  3. Moderate aortic regurgitation  I35.1 PCV ECHOCARDIOGRAM COMPLETE    Meds ordered this encounter  Medications  . valsartan-hydrochlorothiazide (DIOVAN-HCT) 320-12.5 MG tablet    Sig: Take 1 tablet by mouth every morning.    Dispense:  30 tablet    Refill:  3    D/C Losartan and HCTZ    Medications Discontinued During This Encounter  Medication Reason  . omeprazole (PRILOSEC) 40 MG capsule Patient Preference  . hydrochlorothiazide (HYDRODIURIL) 25 MG tablet Change in therapy  . losartan (COZAAR) 50 MG tablet Change in therapy    Recommendations:   JONNELL HENTGES  is a 79 y.o. female  with  lupus arthritis, hypertension, mild obesity, hyperglycemia , exercise nuclear stress test in April 2019 that was considered low risk study, moderate aortic regurgitation and MVP with moderate mitral regurgitation presents here for annual visit.  States that since last office visit symptoms of dyspnea has improved and she has not had any further chest pain.  She has been walking at least 1 mile a day.  Denies PND or orthopnea or leg edema.  On physical exam, her mitral regurgitation murmur appears to be much more prominent.  I will repeat an echocardiogram. Lipids are under excellent control as well otherwise labs are within normal limits.  Her blood pressure is elevated today, I will discontinue losartan and also HCTZ and switch her to valsartan 320/12.5 mg every morning.  I would like to see her back in 6 weeks for follow-up of mitral regurgitation with echocardiogram and also follow-up of hypertension.      Yates Decamp, MD, [facility] 05/29/2020, 9:54 AM Office: [phone]

## 2020-06-04 ENCOUNTER — Telehealth: Payer: Self-pay

## 2020-06-04 NOTE — Telephone Encounter (Signed)
In the evening with dinner

## 2020-06-04 NOTE — Telephone Encounter (Signed)
Received a call from patient regarding medication, Nifedipine. Patient is starting valsartan-hydrochlorothiazide 320-12.5mg  in the a.m and would like to know if she should also take Nifedipine as well. Patient states blood pressures have been well controlled at home ranging 120's/80's. Please advise, thank you!

## 2020-06-05 ENCOUNTER — Ambulatory Visit: Payer: Medicare PPO

## 2020-06-05 ENCOUNTER — Other Ambulatory Visit: Payer: Medicare PPO

## 2020-06-05 ENCOUNTER — Other Ambulatory Visit: Payer: Self-pay

## 2020-06-05 DIAGNOSIS — I351 Nonrheumatic aortic (valve) insufficiency: Secondary | ICD-10-CM

## 2020-06-05 DIAGNOSIS — I340 Nonrheumatic mitral (valve) insufficiency: Secondary | ICD-10-CM

## 2020-06-05 NOTE — Telephone Encounter (Signed)
Left patient a detailed message on voicemail. Advised patient to call back with any further questions or concerns.

## 2020-06-11 ENCOUNTER — Encounter: Payer: Self-pay | Admitting: Internal Medicine

## 2020-06-12 NOTE — Progress Notes (Signed)
Called patient, NA, LMAM

## 2020-06-13 NOTE — Progress Notes (Signed)
Called and spoke with patient regarding her echocardiogram results.

## 2020-06-25 ENCOUNTER — Other Ambulatory Visit: Payer: Self-pay | Admitting: Rheumatology

## 2020-06-25 DIAGNOSIS — M3219 Other organ or system involvement in systemic lupus erythematosus: Secondary | ICD-10-CM

## 2020-06-25 NOTE — Progress Notes (Signed)
Office Visit Note  Patient: Jessica Booker             Date of Birth: 1940/10/13           MRN: [id number]             PCP: Glendale Chard, MD Referring: Glendale Chard, MD Visit Date: 06/27/2020 Occupation: _0 @  Subjective:  Neck stiffness   History of Present Illness: Jessica Booker is a 79 y.o. female with history of systemic lupus erythematosus and osteoarthritis.  Patient is taking Plaquenil 200 mg 1 tablet by mouth twice daily Monday through Friday.  She continues to tolerate Plaquenil without any side effects.  She denies any signs or symptoms of a systemic lupus flare recently.  She states that she did have a nasal ulcer recently but denies any oral ulcerations.  She denies any sicca symptoms.  She has not had any recent rashes, increased hair loss, or increased photosensitivity.  She denies any enlarged lymph nodes or recent infections.  She continues to have intermittent symptoms of Raynaud's but denies any digital ulcerations or signs of gangrene.  She reports that overall her arthralgias have improved.  She states that her knee joint pain has been better since walking on a daily basis for exercise.  She denies any joint swelling at this time.  She has intermittent neck and lower back pain and stiffness.    Activities of Daily Living:  Patient reports morning stiffness for 0 minutes.   Patient Reports nocturnal pain.  Difficulty dressing/grooming: Denies Difficulty climbing stairs: Denies Difficulty getting out of chair: Denies Difficulty using hands for taps, buttons, cutlery, and/or writing: Denies  Review of Systems  Constitutional: Negative for fatigue.  HENT: Positive for mouth dryness. Negative for mouth sores and nose dryness.        Nasal ulcer  Eyes: Negative for pain, itching and dryness.  Respiratory: Negative for shortness of breath and difficulty breathing.   Cardiovascular: Negative for chest pain and palpitations.  Gastrointestinal: Negative for  blood in stool, constipation and diarrhea.  Endocrine: Negative for increased urination.  Genitourinary: Negative for difficulty urinating.  Musculoskeletal: Positive for muscle tenderness. Negative for arthralgias, joint pain, joint swelling, myalgias, morning stiffness and myalgias.  Skin: Negative for color change, rash and redness.  Allergic/Immunologic: Negative for susceptible to infections.  Neurological: Negative for dizziness, headaches, memory loss and weakness.  Hematological: Negative for bruising/bleeding tendency.  Psychiatric/Behavioral: Negative for confusion and sleep disturbance.    PMFS History:  Patient Active Problem List   Diagnosis Date Noted  . Dyspnea 04/11/2019  . Belching 04/11/2019  . Constipation 04/11/2019  . Essential hypertension 11/02/2018  . Lupus ([place]) 11/02/2018  . Primary osteoarthritis of both knees 06/06/2018  . Cough variant asthma 08/25/2017  . Systemic lupus erythematosus ([place]) 02/18/2017  . High risk medication use 02/18/2017  . Raynaud's disease without gangrene 02/18/2017  . DDD (degenerative disc disease), lumbar 02/18/2017  . Primary osteoarthritis of both hands 02/18/2017  . Primary osteoarthritis of both feet 02/18/2017  . Vitamin D deficiency 02/18/2017  . History of hypertension 02/18/2017  . History of neuropathy 02/18/2017    Past Medical History:  Diagnosis Date  . Asthma   . Hypertension   . Lupus ([place])     Family History  Problem Relation Age of Onset  . Heart attack Mother   . Stomach cancer Father   . Hypertension Sister   . Heart disease Sister    Past Surgical History:  Procedure Laterality Date  . ABDOMINAL HYSTERECTOMY    . CHOLECYSTECTOMY    . SHOULDER ARTHROSCOPY W/ ROTATOR CUFF REPAIR     R shoulder  . TONSILLECTOMY     Social History   Social History Narrative  . Not on file   Immunization History  Administered Date(s) Administered  . Influenza, High Dose Seasonal PF 06/21/2017, 05/29/2018,  05/16/2019  . Influenza-Unspecified 05/30/2018, 05/16/2019  . PFIZER SARS-COV-2 Vaccination 10/04/2019, 10/30/2019, 05/23/2020  . Pneumococcal Conjugate-13 07/06/2019  . Zoster Recombinat (Shingrix) 05/29/2018, 08/01/2018     Objective: Vital Signs: BP 138/70 (BP Location: Left Arm, Patient Position: Sitting, Cuff Size: Normal)   Pulse (!) 59   Resp 14   Ht 5' 4.5" (1.638 m)   Wt 161 lb (73 kg)   BMI 27.21 kg/m    Physical Exam Vitals and nursing note reviewed.  Constitutional:      Appearance: She is well-developed.  HENT:     Head: Normocephalic and atraumatic.  Eyes:     Conjunctiva/sclera: Conjunctivae normal.  Pulmonary:     Effort: Pulmonary effort is normal.  Abdominal:     Palpations: Abdomen is soft.  Musculoskeletal:     Cervical back: Normal range of motion.  Skin:    General: Skin is warm and dry.     Capillary Refill: Capillary refill takes less than 2 seconds.  Neurological:     Mental Status: She is alert and oriented to person, place, and time.  Psychiatric:        Behavior: Behavior normal.      Musculoskeletal Exam: C-spine limited ROM with lateral rotation. Thoracic and lumbar spine good ROM.  Shoulder joints, elbow joints, wrist joints, MCPs, PIPs, and DIPs good ROM with no synovitis.  Complete fist formation bilaterally. PIP and DIP thickening consistent with osteoarthritis of both hands.  Hip joints, knee joints, and ankle joints good ROM with no discomfort.  No warmth or effusion of knee joints.  No tenderness or swelling of ankle joints.   CDAI Exam: CDAI Score: -- Patient Global: --; Provider Global: -- Swollen: --; Tender: -- Joint Exam 06/27/2020   No joint exam has been documented for this visit   There is currently no information documented on the homunculus. Go to the Rheumatology activity and complete the homunculus joint exam.  Investigation: No additional findings.  Imaging: PCV ECHOCARDIOGRAM COMPLETE  Result Date:  06/08/2020 Echocardiogram 06/05/2020: Normal LV systolic function with visual EF 60-65%. Left ventricle cavity is normal in size. Mild left ventricular hypertrophy. Normal global wall motion. Indeterminate diastolic filling pattern, elevated LAP. Mild to moderate aortic regurgitation. Aortic valve sclerosis without stenosis. Mild (Grade I) mitral regurgitation. Mild tricuspid regurgitation. Mild pulmonic valve stenosis. Compared to prior study dated 05/09/2019 no significant change.    Recent Labs: Lab Results  Component Value Date   WBC 7.3 11/14/2019   HGB 12.0 11/14/2019   PLT 277 11/14/2019   NA 139 04/21/2020   K 4.2 04/21/2020   CL 102 04/21/2020   CO2 28 04/21/2020   GLUCOSE 78 04/21/2020   BUN 12 04/21/2020   CREATININE 0.75 04/21/2020   BILITOT 0.5 11/14/2019   ALKPHOS 104 11/14/2019   AST 21 11/14/2019   ALT 14 11/14/2019   PROT 7.6 11/14/2019   ALBUMIN 4.5 11/14/2019   CALCIUM 9.9 04/21/2020   GFRAA 88 04/21/2020    Speciality Comments: PLQ Eye Exam: 01/11/2020 WNL @ [facility] Follow up in 1 year.   Procedures:  No procedures performed  Allergies: Patient has no known allergies.   Assessment / Plan:     Visit Diagnoses: Other systemic lupus erythematosus with other organ involvement ([place]) - -ANA, positive Smith, and positive Ro and RNP: She has not had any signs or symptoms of a systemic lupus flare recently.  She is clinically doing well on Plaquenil 200 mg 1 tablet by mouth twice daily Monday through Friday.  She is tolerating Plaquenil without any side effects.  Her arthralgias have improved and she has no joint tenderness or synovitis on examination today.  She has not had any recent rashes, increased photosensitivity, or increased hair loss.  She has intermittent symptoms of Raynaud's but no digital ulcerations or signs of gangrene were noted on exam.  She had 1 nasal ulceration last week but has not had any oral ulcerations.  No cervical lymphadenopathy was  palpable on exam today.  She is not experiencing any sicca symptoms at this time.  Lab work from 04/05/2019 was reviewed with the patient today in the office: ESR 31, complements within normal limits, and double-stranded DNA was 4.  She is due to update autoimmune lab work today.  Orders were released.  She will continue taking Plaquenil 200 mg 1 tablet by mouth twice daily Monday through Friday.  A refill of Plaquenil was sent to the pharmacy.  She was advised to notify us if she develops signs or symptoms of a flare.  She will follow-up in the office in 5 months.- Plan: CBC with Differential/Platelet, COMPLETE METABOLIC PANEL WITH GFR, Urinalysis, Routine w reflex microscopic, Anti-DNA antibody, double-stranded, C3 and C4, Sedimentation rate, hydroxychloroquine (PLAQUENIL) 200 MG tablet  High risk medication use - PLQ 200 mg 1 tablet by mouth twice daily M-F. PLQ Eye Exam: 01/11/2020.  BMP within normal limits on 04/21/2020.  CBC within normal limits on 11/14/2019.  She is due to update lab work today.  Orders for CBC and CMP were released.  She will require updated lab work every 5 months to monitor for drug toxicity.- Plan: CBC with Differential/Platelet, COMPLETE METABOLIC PANEL WITH GFR She has not had any recent infections.  She has received all 3 COVID-19 vaccinations.   Raynaud's disease without gangrene: She has intermittent symptoms of raynaud's.  No digital ulcerations or signs of gangrene.  We discussed the importance of avoiding triggers, keeping her core body temperature warm, wearing gloves and socks, and drinking warm fluids.  She was advised to notify us if she develops more frequent or severe symptoms.   Primary osteoarthritis of both hands: She has PIP and DIP thickening consistent with osteoarthritis of both hands.  She has no joint tenderness or inflammation on exam today.  She is able to make a complete fist bilaterally.  We discussed the importance of joint protection and muscle  strengthening.  She was given a handout of hand exercises to perform.  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  No warmth or effusion was noted.  Her left knee joint pain has improved significantly since starting to walk on a daily basis for exercise.  We discussed the importance of regular exercise and lower extremity muscle strengthening.  Primary osteoarthritis of both feet: She experiences nocturnal pain in both feet. She has seen a podiatrist in the past. She has been walking for exercise on a daily basis for exercise. She is wearing proper fitting shoes.  Neck stiffness: She experiences intermittent discomfort and stiffness in  her neck.  She has no symptoms of radiculopathy at this time.  We discussed the importance of performing neck exercises on a daily basis.  We also discussed the use of a heating pad on her trapezius muscles which have been tender and tense at times.  She was given a handout of neck ROM exercises to perform.  She was advised to notify us if her neck pain persists or worsens and she will return for an x-ray of the C-spine.  DDD (degenerative disc disease), lumbar  Other medical conditions are listed as follows:   History of neuropathy  History of hypertension  Vitamin D deficiency  Chronic LLQ pain: Chronic constipation. She has seen a GI specialist.  We discussed the importance of fiber and fluid intake.    Orders: Orders Placed This Encounter  Procedures  . CBC with Differential/Platelet  . COMPLETE METABOLIC PANEL WITH GFR  . Urinalysis, Routine w reflex microscopic  . Anti-DNA antibody, double-stranded  . C3 and C4  . Sedimentation rate   Meds ordered this encounter  Medications  . hydroxychloroquine (PLAQUENIL) 200 MG tablet    Sig: Take 1 tablet by mouth twice daily Monday through Friday only.    Dispense:  120 tablet    Refill:  0      Follow-Up Instructions:  Return in about 5 months (around 11/25/2020) for Systemic lupus erythematosus, Osteoarthritis, DDD.   Ofilia Neas, PA-C  Note - This record has been created using Dragon software.  Chart creation errors have been sought, but may not always  have been located. Such creation errors do not reflect on  the standard of medical care.

## 2020-06-27 ENCOUNTER — Ambulatory Visit: Payer: Medicare PPO | Admitting: Physician Assistant

## 2020-06-27 ENCOUNTER — Other Ambulatory Visit: Payer: Self-pay

## 2020-06-27 ENCOUNTER — Encounter: Payer: Self-pay | Admitting: Physician Assistant

## 2020-06-27 VITALS — BP 138/70 | HR 59 | Resp 14 | Ht 64.5 in | Wt 161.0 lb

## 2020-06-27 DIAGNOSIS — M19072 Primary osteoarthritis, left ankle and foot: Secondary | ICD-10-CM

## 2020-06-27 DIAGNOSIS — M436 Torticollis: Secondary | ICD-10-CM

## 2020-06-27 DIAGNOSIS — R1032 Left lower quadrant pain: Secondary | ICD-10-CM

## 2020-06-27 DIAGNOSIS — M19071 Primary osteoarthritis, right ankle and foot: Secondary | ICD-10-CM | POA: Diagnosis not present

## 2020-06-27 DIAGNOSIS — M19042 Primary osteoarthritis, left hand: Secondary | ICD-10-CM

## 2020-06-27 DIAGNOSIS — E559 Vitamin D deficiency, unspecified: Secondary | ICD-10-CM

## 2020-06-27 DIAGNOSIS — M3219 Other organ or system involvement in systemic lupus erythematosus: Secondary | ICD-10-CM

## 2020-06-27 DIAGNOSIS — Z8669 Personal history of other diseases of the nervous system and sense organs: Secondary | ICD-10-CM

## 2020-06-27 DIAGNOSIS — M170 Bilateral primary osteoarthritis of knee: Secondary | ICD-10-CM

## 2020-06-27 DIAGNOSIS — M5136 Other intervertebral disc degeneration, lumbar region: Secondary | ICD-10-CM | POA: Diagnosis not present

## 2020-06-27 DIAGNOSIS — M19041 Primary osteoarthritis, right hand: Secondary | ICD-10-CM

## 2020-06-27 DIAGNOSIS — G8929 Other chronic pain: Secondary | ICD-10-CM

## 2020-06-27 DIAGNOSIS — I7300 Raynaud's syndrome without gangrene: Secondary | ICD-10-CM

## 2020-06-27 DIAGNOSIS — Z8679 Personal history of other diseases of the circulatory system: Secondary | ICD-10-CM

## 2020-06-27 DIAGNOSIS — Z79899 Other long term (current) drug therapy: Secondary | ICD-10-CM | POA: Diagnosis not present

## 2020-06-27 MED ORDER — HYDROXYCHLOROQUINE SULFATE 200 MG PO TABS: ORAL_TABLET | ORAL | 0 refills | Status: DC

## 2020-06-27 NOTE — Patient Instructions (Signed)
Neck Exercises Ask your health care provider which exercises are safe for you. Do exercises exactly as told by your health care provider and adjust them as directed. It is normal to feel mild stretching, pulling, tightness, or discomfort as you do these exercises. Stop right away if you feel sudden pain or your pain gets worse. Do not begin these exercises until told by your health care provider. Neck exercises can be important for many reasons. They can improve strength and maintain flexibility in your neck, which will help your upper back and prevent neck pain. Stretching exercises Rotation neck stretching  1. Sit in a chair or stand up. 2. Place your feet flat on the floor, shoulder width apart. 3. Slowly turn your head (rotate) to the right until a slight stretch is felt. Turn it all the way to the right so you can look over your right shoulder. Do not tilt or tip your head. 4. Hold this position for 10-30 seconds. 5. Slowly turn your head (rotate) to the left until a slight stretch is felt. Turn it all the way to the left so you can look over your left shoulder. Do not tilt or tip your head. 6. Hold this position for 10-30 seconds. Repeat __________ times. Complete this exercise __________ times a day. Neck retraction 1. Sit in a sturdy chair or stand up. 2. Look straight ahead. Do not bend your neck. 3. Use your fingers to push your chin backward (retraction). Do not bend your neck for this movement. Continue to face straight ahead. If you are doing the exercise properly, you will feel a slight sensation in your throat and a stretch at the back of your neck. 4. Hold the stretch for 1-2 seconds. Repeat __________ times. Complete this exercise __________ times a day. Strengthening exercises Neck press 1. Lie on your back on a firm bed or on the floor with a pillow under your head. 2. Use your neck muscles to push your head down on the pillow and straighten your spine. 3. Hold the position  as well as you can. Keep your head facing up (in a neutral position) and your chin tucked. 4. Slowly count to 5 while holding this position. Repeat __________ times. Complete this exercise __________ times a day. Isometrics These are exercises in which you strengthen the muscles in your neck while keeping your neck still (isometrics). 1. Sit in a supportive chair and place your hand on your forehead. 2. Keep your head and face facing straight ahead. Do not flex or extend your neck while doing isometrics. 3. Push forward with your head and neck while pushing back with your hand. Hold for 10 seconds. 4. Do the sequence again, this time putting your hand against the back of your head. Use your head and neck to push backward against the hand pressure. 5. Finally, do the same exercise on either side of your head, pushing sideways against the pressure of your hand. Repeat __________ times. Complete this exercise __________ times a day. Prone head lifts 1. Lie face-down (prone position), resting on your elbows so that your chest and upper back are raised. 2. Start with your head facing downward, near your chest. Position your chin either on or near your chest. 3. Slowly lift your head upward. Lift until you are looking straight ahead. Then continue lifting your head as far back as you can comfortably stretch. 4. Hold your head up for 5 seconds. Then slowly lower it to your starting position. Repeat __________   times. Complete this exercise __________ times a day. Supine head lifts 1. Lie on your back (supine position), bending your knees to point to the ceiling and keeping your feet flat on the floor. 2. Lift your head slowly off the floor, raising your chin toward your chest. 3. Hold for 5 seconds. Repeat __________ times. Complete this exercise __________ times a day. Scapular retraction 1. Stand with your arms at your sides. Look straight ahead. 2. Slowly pull both shoulders (scapulae) backward  and downward (retraction) until you feel a stretch between your shoulder blades in your upper back. 3. Hold for 10-30 seconds. 4. Relax and repeat. Repeat __________ times. Complete this exercise __________ times a day. Contact a health care provider if:  Your neck pain or discomfort gets much worse when you do an exercise.  Your neck pain or discomfort does not improve within 2 hours after you exercise. If you have any of these problems, stop exercising right away. Do not do the exercises again unless your health care provider says that you can. Get help right away if:  You develop sudden, severe neck pain. If this happens, stop exercising right away. Do not do the exercises again unless your health care provider says that you can. This information is not intended to replace advice given to you by your health care provider. Make sure you discuss any questions you have with your health care provider. Document Revised: 06/14/2018 Document Reviewed: 06/14/2018 Elsevier Patient Education  2020 [employer] Hand Exercises Hand exercises can be helpful for almost anyone. These exercises can strengthen the hands, improve flexibility and movement, and increase blood flow to the hands. These results can make work and daily tasks easier. Hand exercises can be especially helpful for people who have joint pain from arthritis or have nerve damage from overuse (carpal tunnel syndrome). These exercises can also help people who have injured a hand. Exercises Most of these hand exercises are gentle stretching and motion exercises. It is usually safe to do them often throughout the day. Warming up your hands before exercise may help to reduce stiffness. You can do this with gentle massage or by placing your hands in warm water for 10-15 minutes. It is normal to feel some stretching, pulling, tightness, or mild discomfort as you begin new exercises. This will gradually improve. Stop an exercise right away if  you feel sudden, severe pain or your pain gets worse. Ask your health care provider which exercises are best for you. Knuckle bend or "claw" fist 1. Stand or sit with your arm, hand, and all five fingers pointed straight up. Make sure to keep your wrist straight during the exercise. 2. Gently bend your fingers down toward your palm until the tips of your fingers are touching the top of your palm. Keep your big knuckle straight and just bend the small knuckles in your fingers. 3. Hold this position for __________ seconds. 4. Straighten (extend) your fingers back to the starting position. Repeat this exercise 5-10 times with each hand. Full finger fist 1. Stand or sit with your arm, hand, and all five fingers pointed straight up. Make sure to keep your wrist straight during the exercise. 2. Gently bend your fingers into your palm until the tips of your fingers are touching the middle of your palm. 3. Hold this position for __________ seconds. 4. Extend your fingers back to the starting position, stretching every joint fully. Repeat this exercise 5-10 times with each hand. Straight fist 1. Stand or   sit with your arm, hand, and all five fingers pointed straight up. Make sure to keep your wrist straight during the exercise. 2. Gently bend your fingers at the big knuckle, where your fingers meet your hand, and the middle knuckle. Keep the knuckle at the tips of your fingers straight and try to touch the bottom of your palm. 3. Hold this position for __________ seconds. 4. Extend your fingers back to the starting position, stretching every joint fully. Repeat this exercise 5-10 times with each hand. Tabletop 1. Stand or sit with your arm, hand, and all five fingers pointed straight up. Make sure to keep your wrist straight during the exercise. 2. Gently bend your fingers at the big knuckle, where your fingers meet your hand, as far down as you can while keeping the small knuckles in your fingers  straight. Think of forming a tabletop with your fingers. 3. Hold this position for __________ seconds. 4. Extend your fingers back to the starting position, stretching every joint fully. Repeat this exercise 5-10 times with each hand. Finger spread 1. Place your hand flat on a table with your palm facing down. Make sure your wrist stays straight as you do this exercise. 2. Spread your fingers and thumb apart from each other as far as you can until you feel a gentle stretch. Hold this position for __________ seconds. 3. Bring your fingers and thumb tight together again. Hold this position for __________ seconds. Repeat this exercise 5-10 times with each hand. Making circles 1. Stand or sit with your arm, hand, and all five fingers pointed straight up. Make sure to keep your wrist straight during the exercise. 2. Make a circle by touching the tip of your thumb to the tip of your index finger. 3. Hold for __________ seconds. Then open your hand wide. 4. Repeat this motion with your thumb and each finger on your hand. Repeat this exercise 5-10 times with each hand. Thumb motion 1. Sit with your forearm resting on a table and your wrist straight. Your thumb should be facing up toward the ceiling. Keep your fingers relaxed as you move your thumb. 2. Lift your thumb up as high as you can toward the ceiling. Hold for __________ seconds. 3. Bend your thumb across your palm as far as you can, reaching the tip of your thumb for the small finger (pinkie) side of your palm. Hold for __________ seconds. Repeat this exercise 5-10 times with each hand. Grip strengthening  1. Hold a stress ball or other soft ball in the middle of your hand. 2. Slowly increase the pressure, squeezing the ball as much as you can without causing pain. Think of bringing the tips of your fingers into the middle of your palm. All of your finger joints should bend when doing this exercise. 3. Hold your squeeze for __________  seconds, then relax. Repeat this exercise 5-10 times with each hand. Contact a health care provider if:  Your hand pain or discomfort gets much worse when you do an exercise.  Your hand pain or discomfort does not improve within 2 hours after you exercise. If you have any of these problems, stop doing these exercises right away. Do not do them again unless your health care provider says that you can. Get help right away if:  You develop sudden, severe hand pain or swelling. If this happens, stop doing these exercises right away. Do not do them again unless your health care provider says that you can. This information   is not intended to replace advice given to you by your health care provider. Make sure you discuss any questions you have with your health care provider. Document Revised: 12/07/2018 Document Reviewed: 08/17/2018 Elsevier Patient Education  2020 [employer]  

## 2020-06-30 LAB — CBC WITH DIFFERENTIAL/PLATELET
Absolute Monocytes: 451 cells/uL (ref 200–950)
Basophils Absolute: 31 cells/uL (ref 0–200)
Basophils Relative: 0.5 %
Eosinophils Absolute: 110 cells/uL (ref 15–500)
Eosinophils Relative: 1.8 %
HCT: 36.3 % (ref 35.0–45.0)
Hemoglobin: 11.5 g/dL — ABNORMAL LOW (ref 11.7–15.5)
Lymphs Abs: 1720 cells/uL (ref 850–3900)
MCH: 26.3 pg — ABNORMAL LOW (ref 27.0–33.0)
MCHC: 31.7 g/dL — ABNORMAL LOW (ref 32.0–36.0)
MCV: 83.1 fL (ref 80.0–100.0)
MPV: 10.7 fL (ref 7.5–12.5)
Monocytes Relative: 7.4 %
Neutro Abs: 3788 cells/uL (ref 1500–7800)
Neutrophils Relative %: 62.1 %
Platelets: 300 10*3/uL (ref 140–400)
RBC: 4.37 10*6/uL (ref 3.80–5.10)
RDW: 12.8 % (ref 11.0–15.0)
Total Lymphocyte: 28.2 %
WBC: 6.1 10*3/uL (ref 3.8–10.8)

## 2020-06-30 LAB — URINALYSIS, ROUTINE W REFLEX MICROSCOPIC
Bacteria, UA: NONE SEEN /HPF
Bilirubin Urine: NEGATIVE
Glucose, UA: NEGATIVE
Hgb urine dipstick: NEGATIVE
Hyaline Cast: NONE SEEN /LPF
Ketones, ur: NEGATIVE
Nitrite: NEGATIVE
Protein, ur: NEGATIVE
RBC / HPF: NONE SEEN /HPF (ref 0–2)
Specific Gravity, Urine: 1.013 (ref 1.001–1.03)
pH: 5.5 (ref 5.0–8.0)

## 2020-06-30 LAB — C3 AND C4
C3 Complement: 126 mg/dL (ref 83–193)
C4 Complement: 31 mg/dL (ref 15–57)

## 2020-06-30 LAB — COMPLETE METABOLIC PANEL WITH GFR
AG Ratio: 1.2 (calc) (ref 1.0–2.5)
ALT: 12 U/L (ref 6–29)
AST: 17 U/L (ref 10–35)
Albumin: 4.2 g/dL (ref 3.6–5.1)
Alkaline phosphatase (APISO): 81 U/L (ref 37–153)
BUN: 19 mg/dL (ref 7–25)
CO2: 26 mmol/L (ref 20–32)
Calcium: 9.8 mg/dL (ref 8.6–10.4)
Chloride: 105 mmol/L (ref 98–110)
Creat: 0.87 mg/dL (ref 0.60–0.93)
GFR, Est African American: 73 mL/min/{1.73_m2} (ref >=60)
GFR, Est Non African American: 63 mL/min/{1.73_m2} (ref >=60)
Globulin: 3.5 g/dL (calc) (ref 1.9–3.7)
Glucose, Bld: 88 mg/dL (ref 65–99)
Potassium: 4.2 mmol/L (ref 3.5–5.3)
Sodium: 139 mmol/L (ref 135–146)
Total Bilirubin: 0.5 mg/dL (ref 0.2–1.2)
Total Protein: 7.7 g/dL (ref 6.1–8.1)

## 2020-06-30 LAB — SEDIMENTATION RATE: Sed Rate: 19 mm/h (ref 0–30)

## 2020-06-30 LAB — ANTI-DNA ANTIBODY, DOUBLE-STRANDED: ds DNA Ab: 3 IU/mL

## 2020-06-30 NOTE — Progress Notes (Signed)
Hemoglobin is borderline low.  Please notify the patient and encourage her to take a multivitamin with iron. CMP WNL. UA revealed WBC 10-20s and 2+ leukocytes.  Negative for nitrites.  Please advise the patient to follow up with PCP if she develops symptoms of a UTI.    DsDNA negative.  Complements and ESR WNL.  Labs are not consistent with a flare.  No change in therapy recommended at this time.

## 2020-07-17 ENCOUNTER — Ambulatory Visit: Payer: Medicare PPO | Admitting: Cardiology

## 2020-07-22 ENCOUNTER — Ambulatory Visit: Payer: Medicare PPO | Admitting: Cardiology

## 2020-07-22 ENCOUNTER — Encounter: Payer: Self-pay | Admitting: Cardiology

## 2020-07-22 ENCOUNTER — Other Ambulatory Visit: Payer: Self-pay

## 2020-07-22 VITALS — BP 140/53 | HR 55 | Resp 16 | Ht 64.0 in | Wt 165.0 lb

## 2020-07-22 DIAGNOSIS — I351 Nonrheumatic aortic (valve) insufficiency: Secondary | ICD-10-CM

## 2020-07-22 DIAGNOSIS — I10 Essential (primary) hypertension: Secondary | ICD-10-CM | POA: Diagnosis not present

## 2020-07-22 DIAGNOSIS — I340 Nonrheumatic mitral (valve) insufficiency: Secondary | ICD-10-CM | POA: Diagnosis not present

## 2020-07-22 NOTE — Progress Notes (Signed)
Primary Physician:  Dorothyann Peng, MD   Patient ID: Jessica Booker, female    DOB: Jun 18, 1941, 79 y.o.   MRN: [id number]  Subjective:    Chief Complaint  Patient presents with  . Aortic Insuffiency    F/U echo and hypertension  . Mitral Regurgitation    HPI: Jessica Booker  is a 79 y.o. female  with  lupus arthritis, hypertension, mild obesity, hyperglycemia , exercise nuclear stress test in April 2019 that was considered low risk study, moderate aortic regurgitation and MVP with mild to moderate mitral regurgitation presents here for for 6-week visit, on her last office visit I noticed mitral regurgitation murmur to be much more prominent.  She has been walking at least 1 mile a day.  Denies PND or orthopnea or leg edema.  Her main complaint is neck pain.  Past Medical History:  Diagnosis Date  . Asthma   . Hypertension   . Lupus [facility])     Past Surgical History:  Procedure Laterality Date  . ABDOMINAL HYSTERECTOMY    . CHOLECYSTECTOMY    . SHOULDER ARTHROSCOPY W/ ROTATOR CUFF REPAIR     R shoulder  . TONSILLECTOMY     Social History   Tobacco Use  . Smoking status: Never Smoker  . Smokeless tobacco: Never Used  Substance Use Topics  . Alcohol use: No    Review of Systems  Cardiovascular: Negative for chest pain, dyspnea on exertion and leg swelling.  Musculoskeletal: Positive for back pain, joint pain and neck pain.  Gastrointestinal: Negative for melena.   Objective:  Blood pressure (!) 140/53, pulse (!) 55, resp. rate 16, height 5\' 4"  (1.626 m), weight 165 lb (74.8 kg), SpO2 99 %. Body mass index is 28.32 kg/m.  Vitals with BMI 07/22/2020 07/22/2020 06/27/2020  Height - 5\' 4"  5' 4.5"  Weight - 165 lbs 161 lbs  BMI - 28.31 27.22  Systolic 140 150 06/29/2020  Diastolic 53 59 70  Pulse 55 61 59      Physical Exam Vitals reviewed.  Constitutional:      Appearance: She is well-developed.  Cardiovascular:     Rate and Rhythm: Normal rate and regular rhythm.      Pulses: Normal pulses and intact distal pulses. A midsystolic click.          Carotid pulses are on the right side with bruit and on the left side with bruit.    Heart sounds: Murmur heard.  Crescendo-decrescendo mid to late systolic murmur is present with a grade of 3/6 at the apex.  Harsh early systolic murmur of grade 2/6 is also present at the upper right sternal border.   Pulmonary:     Effort: Pulmonary effort is normal. No accessory muscle usage or respiratory distress.     Breath sounds: Normal breath sounds.  Abdominal:     General: Bowel sounds are normal.     Palpations: Abdomen is soft.  Musculoskeletal:        General: Normal range of motion.     Cervical back: Normal range of motion.   Radiology: No results found.  Laboratory examination:    CMP Latest Ref Rng & Units 06/27/2020 04/21/2020 11/14/2019  Glucose 65 - 99 mg/dL 88 78 75  BUN 7 - 25 mg/dL 19 12 19   Creatinine 0.60 - 0.93 mg/dL 04/23/2020 11/16/2019 )  Sodium 135 - 146 mmol/L 139 139 138  Potassium 3.5 - 5.3 mmol/L 4.2 4.2 4.1  Chloride 98 - 110  mmol/L 105 102 99  CO2 20 - 32 mmol/L 26 28 24   Calcium 8.6 - 10.4 mg/dL 9.8 9.9 9.9  Total Protein 6.1 - 8.1 g/dL 7.7 - 7.6  Total Bilirubin 0.2 - 1.2 mg/dL 0.5 - 0.5  Alkaline Phos 39 - 117 IU/L - - 104  AST 10 - 35 U/L 17 - 21  ALT 6 - 29 U/L 12 - 14   CBC Latest Ref Rng & Units 06/27/2020 11/14/2019 09/18/2019  WBC 3.8 - 10.8 Thousand/uL 6.1 7.3 7.4  Hemoglobin 11.7 - 15.5 g/dL 11.5(L) 12.0 11.5(L)  Hematocrit 35 - 45 % 36.3 36.0 35.0  Platelets 140 - 400 Thousand/uL 300 277 273   Lipid Panel Recent Labs    11/14/19 1614  CHOL 174  TRIG 49  LDLCALC 98  HDL 66  CHOLHDL 2.6     HEMOGLOBIN A1C No results found for: HGBA1C, MPG TSH Recent Labs    11/14/19 1614  TSH 0.983    PRN Meds:. There are no discontinued medications. Current Outpatient Medications on File Prior to Visit  Medication Sig Dispense Refill  . albuterol (VENTOLIN HFA) 108  (90 Base) MCG/ACT inhaler Inhale 1-2 puffs into the lungs every 6 (six) hours as needed for wheezing. 18 g 3  . aspirin 81 MG tablet Take 81 mg by mouth daily. Takes as needed    . budesonide-formoterol (SYMBICORT) 80-4.5 MCG/ACT inhaler Take 2 puffs first thing in am and then another 2 puffs about 12 hours later. (Patient taking differently: as needed. Take 2 puffs first thing in am and then another 2 puffs about 12 hours later.) 1 Inhaler 111  . diclofenac sodium (VOLTAREN) 1 % GEL Apply 2 g to 4 g to affected joint up to 4 times daily PRN. 4 Tube 2  . hydroxychloroquine (PLAQUENIL) 200 MG tablet Take 1 tablet by mouth twice daily Monday through Friday only. 120 tablet 0  . linaclotide (LINZESS) 72 MCG capsule Take 72 mcg by mouth as needed.     . loratadine (CLARITIN) 10 MG tablet TAKE 1 TABLET BY MOUTH EVERY DAY (Patient taking differently: as needed. ) 30 tablet 1  . NIFEdipine (PROCARDIA XL/NIFEDICAL XL) 60 MG 24 hr tablet TAKE 1 TABLET BY MOUTH EVERY DAY 90 tablet 1  . polyethylene glycol (MIRALAX / GLYCOLAX) 17 g packet Take 17 g by mouth daily. (Patient taking differently: Take 17 g by mouth as needed. ) 14 each 0  . valsartan-hydrochlorothiazide (DIOVAN-HCT) 320-12.5 MG tablet Take 1 tablet by mouth every morning. 30 tablet 3   No current facility-administered medications on file prior to visit.    Cardiac Studies:   Exercise myoview stress 12/02/2017: 1. The patient performed treadmill exercise using a Bruce protocol, completing 5 minutes. The patient completed an estimated workload of 7.03 METS, reaching 97% of the maximum predicted heart rate. Resting hypertension with exaggerated exercise response. Peak BP 248/48 mmHg. No stress symptoms reported. Fair exercise capacity. No ischemic changes seen on stress electrocardiogram. Frequent PVC's seen at rest, and during exercise. 2. The overall quality of the study is excellent. There is no evidence of abnormal lung activity. Stress and  rest SPECT images demonstrate homogeneous tracer distribution throughout the myocardium. Gated SPECT imaging reveals normal myocardial thickening and wall motion. The left ventricular ejection fraction was normal (72%).  3. Low risk study.  Carotid artery duplex 12/13/2017: No hemodynamically significant arterial disease in the internal carotid artery bilaterally. There is heterogeneous plaque noted in the right external carotid and  carotid bifurcations with less than 50%( minimal) stenosis. Further studies if clinically indicated. No significant change from 07/03/2014.  Echocardiogram 06/05/2020: Normal LV systolic function with visual EF 60-65%. Left ventricle cavity is normal in size. Mild left ventricular hypertrophy. Normal global wall motion. Indeterminate diastolic filling pattern, elevated LAP.  Mild to moderate aortic regurgitation. Aortic valve sclerosis without stenosis. Mild (Grade I) mitral regurgitation.  Mild tricuspid regurgitation. Mild pulmonic valve stenosis. Compared to prior study dated 05/09/2019 no significant change.  EKG:  EKG 05/29/2020: Normal sinus rhythm at rate of 60 bpm, left atrial enlargement, left axis deviation.  Poor R wave progression, cannot exclude anteroseptal infarct old.  No evident ischemia, normal QT interval.  No significant change from 04/20/2019.  Assessment:     ICD-10-CM   1. Moderate mitral regurgitation  I34.0   2. Moderate aortic regurgitation  I35.1   3. Essential hypertension  I10     No orders of the defined types were placed in this encounter.   There are no discontinued medications.  Recommendations:   Jessica Booker  is a 79 y.o. with  lupus arthritis, hypertension, mild obesity, hyperglycemia , exercise nuclear stress test in April 2019 that was considered low risk study, moderate aortic regurgitation and MVP with mild to moderate mitral regurgitation presents here for for 6-week visit, on her last office visit I noticed mitral  regurgitation murmur to be much more prominent.  I reviewed the results of echocardiogram, fortunately no change in aortic regurgitation and mitral regurgitation appears to be mild at most.  There is no clinical evidence of heart failure.  Continue observation for now.  With regard to hypertension, although blood pressure is elevated, patient brings home recordings which are under excellent control.  Hence I did not make any changes.  Low diastolic blood pressure is related to moderate aortic regurgitation.  I reassured the patient.  I will see her back on annual basis.   Yates Decamp, MD, [facility] 07/22/2020, 1:32 PM Office: [phone] Pager: [phone]   States that since last office visit symptoms of dyspnea has improved and she has not had any further chest pain.  She has been walking at least 1 mile a day.  Denies PND or orthopnea or leg edema.  On physical exam, her mitral regurgitation murmur appears to be much more prominent.  I will repeat an echocardiogram. Lipids are under excellent control as well otherwise labs are within normal limits.  Her blood pressure is elevated today, I will discontinue losartan and also HCTZ and switch her to valsartan 320/12.5 mg every morning.  I would like to see her back in 6 weeks for follow-up of mitral regurgitation with echocardiogram and also follow-up of hypertension.      Yates Decamp, MD, [facility] 07/22/2020, 1:32 PM Office: [phone]

## 2020-07-28 ENCOUNTER — Other Ambulatory Visit: Payer: Self-pay | Admitting: Internal Medicine

## 2020-08-26 ENCOUNTER — Other Ambulatory Visit: Payer: Self-pay | Admitting: Cardiology

## 2020-08-26 DIAGNOSIS — I340 Nonrheumatic mitral (valve) insufficiency: Secondary | ICD-10-CM

## 2020-08-26 DIAGNOSIS — I10 Essential (primary) hypertension: Secondary | ICD-10-CM

## 2020-09-14 ENCOUNTER — Other Ambulatory Visit: Payer: Self-pay | Admitting: Physician Assistant

## 2020-09-14 DIAGNOSIS — M3219 Other organ or system involvement in systemic lupus erythematosus: Secondary | ICD-10-CM

## 2020-09-15 NOTE — Telephone Encounter (Signed)
Last Visit: 09/28/2019 Next Visit: 11/27/2020 Labs: 06/27/2020, Hemoglobin is borderline low. Please notify the patient and encourage her to take a multivitamin with iron. CMP WNL. UA revealed WBC 10-20s and 2+ leukocytes. Negative for nitrites. Please advise the patient to follow up with PCP if she develops symptoms of a UTI.   DsDNA negative. Complements and ESR WNL. Labs are not consistent with a flare. No change in therapy recommended at this time.  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

## 2020-11-17 ENCOUNTER — Other Ambulatory Visit: Payer: Self-pay | Admitting: Rheumatology

## 2020-11-17 DIAGNOSIS — M3219 Other organ or system involvement in systemic lupus erythematosus: Secondary | ICD-10-CM

## 2020-11-17 NOTE — Telephone Encounter (Signed)
Last Visit: 1029/2021 Next Visit: 01/19/2021 Labs: 06/27/2020, Hemoglobin is borderline low. Please notify the patient and encourage her to take a multivitamin with iron. CMP WNL. UA revealed WBC 10-20s and 2+ leukocytes. Negative for nitrites. Please advise the patient to follow up with PCP if she develops symptoms of a UTI.   DsDNA negative. Complements and ESR WNL. Labs are not consistent with a flare. No change in therapy recommended at this time.  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

## 2020-11-19 ENCOUNTER — Telehealth: Payer: Self-pay | Admitting: Internal Medicine

## 2020-11-19 NOTE — Telephone Encounter (Signed)
Spoke with spouse he is aware [facility] appointment has been cancelled.  He is aware patient has appointment @ 11/20/20 @ 2:30 with dr Lelon Perla.    Spouse will have patient call back to r/s [facility] appointment

## 2020-11-20 ENCOUNTER — Encounter: Payer: Self-pay | Admitting: Internal Medicine

## 2020-11-20 ENCOUNTER — Other Ambulatory Visit: Payer: Self-pay

## 2020-11-20 ENCOUNTER — Ambulatory Visit: Payer: Medicare PPO | Admitting: Internal Medicine

## 2020-11-20 VITALS — BP 146/70 | HR 56 | Temp 98.0°F | Ht 64.0 in | Wt 169.0 lb

## 2020-11-20 DIAGNOSIS — E663 Overweight: Secondary | ICD-10-CM

## 2020-11-20 DIAGNOSIS — Z6829 Body mass index (BMI) 29.0-29.9, adult: Secondary | ICD-10-CM

## 2020-11-20 DIAGNOSIS — F5101 Primary insomnia: Secondary | ICD-10-CM | POA: Diagnosis not present

## 2020-11-20 DIAGNOSIS — M3219 Other organ or system involvement in systemic lupus erythematosus: Secondary | ICD-10-CM

## 2020-11-20 DIAGNOSIS — Z0000 Encounter for general adult medical examination without abnormal findings: Secondary | ICD-10-CM

## 2020-11-20 DIAGNOSIS — I10 Essential (primary) hypertension: Secondary | ICD-10-CM | POA: Diagnosis not present

## 2020-11-20 DIAGNOSIS — H6121 Impacted cerumen, right ear: Secondary | ICD-10-CM | POA: Diagnosis not present

## 2020-11-20 LAB — POCT UA - MICROALBUMIN: Albumin/Creatinine Ratio, Urine, POC: ABNORMAL

## 2020-11-20 LAB — POCT URINALYSIS DIPSTICK
Bilirubin, UA: NEGATIVE
Blood, UA: NEGATIVE
Glucose, UA: NEGATIVE
Ketones, UA: NEGATIVE
Nitrite, UA: NEGATIVE
Protein, UA: NEGATIVE
Spec Grav, UA: 1.01 (ref 1.010–1.025)
Urobilinogen, UA: 0.2 E.U./dL
pH, UA: 6 (ref 5.0–8.0)

## 2020-11-20 NOTE — Progress Notes (Signed)
I,Katawbba Wiggins,acting as a Education administrator for Maximino Greenland, MD.,have documented all relevant documentation on the behalf of Maximino Greenland, MD,as directed by  Maximino Greenland, MD while in the presence of Maximino Greenland, MD.  This visit occurred during the SARS-CoV-2 public health emergency.  Safety protocols were in place, including screening questions prior to the visit, additional usage of staff PPE, and extensive cleaning of exam room while observing appropriate contact time as indicated for disinfecting solutions.  Subjective:     Patient ID: Jessica Booker , female    DOB: 04-26-1941 , 80 y.o.   MRN: [id number]   Chief Complaint  Patient presents with  . Annual Exam  . Hypertension    HPI  She is here today for a full physical examination. She is no longer followed by GYN. She reports compliance with meds. She denies headaches, chest pain and shortness of breath. States she has been walking regularly. She reports taking a lot of time taking her husband to/from doctor's visits.   Hypertension This is a chronic problem. The problem has been gradually improving since onset. The problem is controlled. Pertinent negatives include no chest pain, headaches or palpitations. The current treatment provides moderate improvement. Compliance problems include exercise.      Past Medical History:  Diagnosis Date  . Asthma   . Hypertension   . Lupus ([place])      Family History  Problem Relation Age of Onset  . Heart attack Mother   . Stomach cancer Father   . Hypertension Sister   . Heart disease Sister      Current Outpatient Medications:  .  aspirin 81 MG tablet, Take 81 mg by mouth daily. Takes as needed, Disp: , Rfl:  .  diclofenac sodium (VOLTAREN) 1 % GEL, Apply 2 g to 4 g to affected joint up to 4 times daily PRN., Disp: 4 Tube, Rfl: 2 .  hydroxychloroquine (PLAQUENIL) 200 MG tablet, TAKE 1 TABLET BY MOUTH TWICE DAILY MONDAY THROUGH FRIDAY ONLY., Disp: 120 tablet, Rfl: 0 .   linaclotide (LINZESS) 72 MCG capsule, Take 72 mcg by mouth as needed. , Disp: , Rfl:  .  loratadine (CLARITIN) 10 MG tablet, TAKE 1 TABLET BY MOUTH EVERY DAY (Patient taking differently: as needed.), Disp: 30 tablet, Rfl: 1 .  NIFEdipine (PROCARDIA XL/NIFEDICAL XL) 60 MG 24 hr tablet, TAKE 1 TABLET BY MOUTH EVERY DAY, Disp: 90 tablet, Rfl: 1 .  polyethylene glycol (MIRALAX / GLYCOLAX) 17 g packet, Take 17 g by mouth daily. (Patient taking differently: Take 17 g by mouth as needed.), Disp: 14 each, Rfl: 0 .  valsartan-hydrochlorothiazide (DIOVAN-HCT) 320-12.5 MG tablet, TAKE 1 TABLET BY MOUTH EVERY DAY IN THE MORNING, Disp: 90 tablet, Rfl: 1   No Known Allergies    The patient states she uses post menopausal status for birth control. Last LMP was No LMP recorded. Patient is postmenopausal.. Negative for Dysmenorrhea. Negative for: breast discharge, breast lump(s), breast pain and breast self exam. Associated symptoms include abnormal vaginal bleeding. Pertinent negatives include abnormal bleeding (hematology), anxiety, decreased libido, depression, difficulty falling sleep, dyspareunia, history of infertility, nocturia, sexual dysfunction, sleep disturbances, urinary incontinence, urinary urgency, vaginal discharge and vaginal itching. Diet regular.The patient states her exercise level is  moderate.  . The patient's tobacco use is:  Social History   Tobacco Use  Smoking Status Never Smoker  Smokeless Tobacco Never Used  . She has been exposed to passive smoke. The patient's alcohol use is:  Social History   Substance and Sexual Activity  Alcohol Use No    Review of Systems  Constitutional: Negative.   HENT: Negative.   Eyes: Negative.   Respiratory: Negative.   Cardiovascular: Negative.  Negative for chest pain and palpitations.  Gastrointestinal: Negative.   Endocrine: Negative.   Genitourinary: Negative.   Musculoskeletal: Negative.   Skin: Negative.   Allergic/Immunologic:  Negative.   Neurological: Negative.  Negative for headaches.  Hematological: Negative.   Psychiatric/Behavioral: Positive for sleep disturbance.     Today's Vitals   11/20/20 1421  BP: (!) 146/70  Pulse: (!) 56  Temp: 98 F (36.7 C)  TempSrc: Oral  Weight: 169 lb (76.7 kg)  Height: '5\' 4"'  (1.626 m)  PainSc: 7   PainLoc: Neck   Body mass index is 29.01 kg/m.  Wt Readings from Last 3 Encounters:  11/20/20 169 lb (76.7 kg)  07/22/20 165 lb (74.8 kg)  06/27/20 161 lb (73 kg)   BP Readings from Last 3 Encounters:  11/20/20 (!) 146/70  07/22/20 (!) 140/53  06/27/20 138/70     Objective:  Physical Exam Vitals and nursing note reviewed.  Constitutional:      Appearance: Normal appearance.  HENT:     Head: Normocephalic and atraumatic.     Right Ear: Ear canal and external ear normal. There is impacted cerumen.     Left Ear: Tympanic membrane, ear canal and external ear normal.     Nose:     Comments: Masked     Mouth/Throat:     Pharynx: Oropharynx is clear.     Comments: Masked  Eyes:     Extraocular Movements: Extraocular movements intact.     Conjunctiva/sclera: Conjunctivae normal.     Pupils: Pupils are equal, round, and reactive to light.  Cardiovascular:     Rate and Rhythm: Normal rate and regular rhythm.     Pulses: Normal pulses.          Carotid pulses are on the right side with bruit.    Heart sounds: Murmur heard.    Pulmonary:     Effort: Pulmonary effort is normal.     Breath sounds: Normal breath sounds.  Abdominal:     General: Bowel sounds are normal.     Palpations: Abdomen is soft.  Genitourinary:    Comments: deferred Musculoskeletal:        General: Normal range of motion.     Cervical back: Normal range of motion and neck supple.  Skin:    General: Skin is warm and dry.  Neurological:     General: No focal deficit present.     Mental Status: She is alert and oriented to person, place, and time.  Psychiatric:        Mood and  Affect: Mood normal.        Behavior: Behavior normal.         Assessment And Plan:     1. Routine general medical examination at health care facility Comments: A full exam was performed. Importance of monthly self breast exams was discussed with the patient. PATIENT IS ADVISED TO GET 30-45 MINUTES REGULAR EXERCISE NO LESS THAN FOUR TO FIVE DAYS PER WEEK - BOTH WEIGHTBEARING EXERCISES AND AEROBIC ARE RECOMMENDED.  PATIENT IS ADVISED TO FOLLOW A HEALTHY DIET WITH AT LEAST SIX FRUITS/VEGGIES PER DAY, DECREASE INTAKE OF RED MEAT, AND TO INCREASE FISH INTAKE TO TWO DAYS PER WEEK.  MEATS/FISH SHOULD NOT BE FRIED, BAKED OR BROILED IS PREFERABLE.  IT IS ALSO IMPORTANT TO CUT BACK ON YOUR SUGAR INTAKE. PLEASE AVOID ANYTHING WITH ADDED SUGAR, CORN SYRUP OR OTHER SWEETENERS. IF YOU MUST USE A SWEETENER, YOU CAN TRY STEVIA. IT IS ALSO IMPORTANT TO AVOID ARTIFICIALLY SWEETENERS AND DIET BEVERAGES. LASTLY, I SUGGEST WEARING SPF 50 SUNSCREEN ON EXPOSED PARTS AND ESPECIALLY WHEN IN THE DIRECT SUNLIGHT FOR AN EXTENDED PERIOD OF TIME.  PLEASE AVOID FAST FOOD RESTAURANTS AND INCREASE YOUR WATER INTAKE.   2. Essential hypertension Comments: Chronic, fair control. She admits she rushed to her appt today. Advised to follow low sodium diet. EKG performed, SB w/o acute changes. She agrees to [facility] referral. She will f/u in six months for re-evaluation. - POCT Urinalysis Dipstick (81002) - POCT UA - Microalbumin - EKG 12-Lead - CMP14+EGFR - Lipid panel - CBC with Diff - Insulin, random(561) - AMB Referral to [place]  3. 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 Most recent Rheum note reviewed.  Encouraged to follow an anti-inflammatory diet.  - AMB Referral to [place]   4. Primary insomnia Comments: She was given samples of Belsomra to take nightly. Advised to take an hour prior to  bedtime. Also reminded that she should develop a bedtime routine.   5. Impacted cerumen of right ear AFTER OBTAINING VERBAL CONSENT, RIGHT EAR WAS FLUSHED BY IRRIGATION. SHE TOLERATED PROCEDURE WELL WITHOUT ANY COMPLICATIONS. NO TM ABNORMALITIES WERE NOTED.  6. Overweight with body mass index (BMI) of 29 to 29.9 in adult Her BMI is acceptable for her demographic.  Advised to aim for at least 150 minutes of exercise per week.   Patient was given opportunity to ask questions. Patient verbalized understanding of the plan and was able to repeat key elements of the plan. All questions were answered to their satisfaction.   I, Maximino Greenland, MD, have reviewed all documentation for this visit. The documentation on 11/24/20 for the exam, diagnosis, procedures, and orders are all accurate and complete.  THE PATIENT IS ENCOURAGED TO PRACTICE SOCIAL DISTANCING DUE TO THE COVID-19 PANDEMIC.

## 2020-11-20 NOTE — Patient Instructions (Signed)

## 2020-11-21 LAB — CBC WITH DIFFERENTIAL/PLATELET
Basophils Absolute: 0 10*3/uL (ref 0.0–0.2)
Basos: 1 %
EOS (ABSOLUTE): 0.3 10*3/uL (ref 0.0–0.4)
Eos: 5 %
Hematocrit: 36.3 % (ref 34.0–46.6)
Hemoglobin: 11.7 g/dL (ref 11.1–15.9)
Immature Grans (Abs): 0 10*3/uL (ref 0.0–0.1)
Immature Granulocytes: 0 %
Lymphocytes Absolute: 2.6 10*3/uL (ref 0.7–3.1)
Lymphs: 36 %
MCH: 25.9 pg — ABNORMAL LOW (ref 26.6–33.0)
MCHC: 32.2 g/dL (ref 31.5–35.7)
MCV: 81 fL (ref 79–97)
Monocytes Absolute: 0.5 10*3/uL (ref 0.1–0.9)
Monocytes: 7 %
Neutrophils Absolute: 3.7 10*3/uL (ref 1.4–7.0)
Neutrophils: 51 %
Platelets: 262 10*3/uL (ref 150–450)
RBC: 4.51 x10E6/uL (ref 3.77–5.28)
RDW: 13 % (ref 11.7–15.4)
WBC: 7.2 10*3/uL (ref 3.4–10.8)

## 2020-11-21 LAB — CMP14+EGFR
ALT: 10 IU/L (ref 0–32)
AST: 20 IU/L (ref 0–40)
Albumin/Globulin Ratio: 1.4 (ref 1.2–2.2)
Albumin: 4.4 g/dL (ref 3.7–4.7)
Alkaline Phosphatase: 106 IU/L (ref 44–121)
BUN/Creatinine Ratio: 20 (ref 12–28)
BUN: 18 mg/dL (ref 8–27)
Bilirubin Total: 0.4 mg/dL (ref 0.0–1.2)
CO2: 24 mmol/L (ref 20–29)
Calcium: 9.5 mg/dL (ref 8.7–10.3)
Chloride: 100 mmol/L (ref 96–106)
Creatinine, Ser: 0.9 mg/dL (ref 0.57–1.00)
Globulin, Total: 3.2 g/dL (ref 1.5–4.5)
Glucose: 79 mg/dL (ref 65–99)
Potassium: 3.9 mmol/L (ref 3.5–5.2)
Sodium: 138 mmol/L (ref 134–144)
Total Protein: 7.6 g/dL (ref 6.0–8.5)
eGFR: 65 mL/min/{1.73_m2} (ref >59)

## 2020-11-21 LAB — LIPID PANEL
Chol/HDL Ratio: 2.4 ratio (ref 0.0–4.4)
Cholesterol, Total: 162 mg/dL (ref 100–199)
HDL: 67 mg/dL (ref >39)
LDL Chol Calc (NIH): 87 mg/dL (ref 0–99)
Triglycerides: 34 mg/dL (ref 0–149)
VLDL Cholesterol Cal: 8 mg/dL (ref 5–40)

## 2020-11-21 LAB — INSULIN, RANDOM: INSULIN: 8.8 u[IU]/mL (ref 2.6–24.9)

## 2020-11-25 ENCOUNTER — Telehealth: Payer: Self-pay

## 2020-11-25 NOTE — Telephone Encounter (Signed)
-----   Message from Dorothyann Peng, MD sent at 11/24/2020  8:03 PM EDT ----- Pls call to check on pt - how does she feel on Belsomra? Which dose does she like better?

## 2020-11-25 NOTE — Telephone Encounter (Signed)
I left the pt a message to call the office back. 

## 2020-11-25 NOTE — Telephone Encounter (Signed)
-----   Message from Robyn Sanders, MD sent at 11/24/2020  8:03 PM EDT ----- Pls call to check on pt - how does she feel on Belsomra? Which dose does she like better?   

## 2020-11-25 NOTE — Telephone Encounter (Signed)
The pt said that she has only taken the 15 mg so far and that she slept for about 7 hours the med was doing good.

## 2020-11-27 ENCOUNTER — Ambulatory Visit: Payer: Medicare PPO | Admitting: Physician Assistant

## 2020-12-05 ENCOUNTER — Telehealth: Payer: Self-pay | Admitting: *Deleted

## 2020-12-05 NOTE — Chronic Care Management (AMB) (Signed)
  Chronic Care Management   Outreach Note  12/05/2020 Name: Jessica Booker MRN: [id number] DOB: [birth year]  Jessica Booker is a 80 y.o. year old female who is a primary care patient of Dorothyann Peng, MD. I reached out to [name] by phone today in response to a referral sent by [name]'s PCP, Dr. Allyne Gee.     An unsuccessful telephone outreach was attempted today. The patient was referred to the case management team for assistance with care management and care coordination.   Follow Up Plan: The care management team will reach out to the patient again over the next 7 days.  If patient returns call to provider office, please advise to call Embedded Care Management Care Guide Gwenevere Ghazi at [phone].  Gwenevere Ghazi  Care Guide, Embedded Care Coordination [facility] Management

## 2020-12-08 ENCOUNTER — Ambulatory Visit (INDEPENDENT_AMBULATORY_CARE_PROVIDER_SITE_OTHER): Payer: Medicare PPO | Admitting: Orthopedic Surgery

## 2020-12-08 ENCOUNTER — Encounter: Payer: Self-pay | Admitting: Orthopedic Surgery

## 2020-12-08 DIAGNOSIS — M7741 Metatarsalgia, right foot: Secondary | ICD-10-CM

## 2020-12-08 DIAGNOSIS — M6701 Short Achilles tendon (acquired), right ankle: Secondary | ICD-10-CM | POA: Diagnosis not present

## 2020-12-08 NOTE — Progress Notes (Signed)
Office Visit Note   Patient: Jessica Booker           Date of Birth: June 21, 1941           MRN: [id number] Visit Date: 12/08/2020              Requested by: Dorothyann Peng, MD [address] [place],  [place] [address] PCP: Dorothyann Peng, MD  Chief Complaint  Patient presents with  . Right Foot - Pain      HPI: Patient is a 80 year old woman who presents complaining of pain across the forefoot worse beneath the fifth metatarsal head right foot.  Patient states the pain is worse at night and wakes her up at night.  Assessment & Plan: Visit Diagnoses:  1. Contracture of right Achilles tendon   2. Metatarsalgia, right foot     Plan: Patient was given instructions and demonstrated Achilles stretching to do 5 times a day a minute at a time recommended a stiff soled walking sneaker.  Discussed the possibility of a gastrocnemius recession.  Follow-Up Instructions: Return in about 4 weeks (around 01/05/2021).   Ortho Exam  Patient is alert, oriented, no adenopathy, well-dressed, normal affect, normal respiratory effort. Examination patient does not have palpable pulses but has been capillary refill.  She has fixed clawing of the toes right foot with her knee extended she has dorsiflexion 10 degrees short of neutral with Achilles contracture there are no Achilles nodules or defects.  There are no plantar ulcers.  Imaging: No results found. No images are attached to the encounter.  Labs: Lab Results  Component Value Date   ESRSEDRATE 19 06/27/2020   ESRSEDRATE 31 (H) 04/05/2019   ESRSEDRATE 31 (H) 10/30/2018     Lab Results  Component Value Date   ALBUMIN 4.4 11/20/2020   ALBUMIN 4.5 11/14/2019   ALBUMIN 4.5 04/11/2019    No results found for: MG Lab Results  Component Value Date   VD25OH 31 10/30/2018   VD25OH 23 (L) 05/25/2018   VD25OH 41 10/26/2017    No results found for: PREALBUMIN CBC EXTENDED Latest Ref Rng & Units 11/20/2020 06/27/2020  11/14/2019  WBC 3.4 - 10.8 x10E3/uL 7.2 6.1 7.3  RBC 3.77 - 5.28 x10E6/uL 4.51 4.37 4.42  HGB 11.1 - 15.9 g/dL 83.1 11.5(L) 12.0  HCT 34.0 - 46.6 % 36.3 36.3 36.0  PLT 150 - 450 x10E3/uL 262 300 277  NEUTROABS 1.4 - 7.0 x10E3/uL 3.7 3,788 4.2  LYMPHSABS 0.7 - 3.1 x10E3/uL 2.6 1,720 2.3     There is no height or weight on file to calculate BMI.  Orders:  No orders of the defined types were placed in this encounter.  No orders of the defined types were placed in this encounter.    Procedures: No procedures performed  Clinical Data: No additional findings.  ROS:  All other systems negative, except as noted in the HPI. Review of Systems  Objective: Vital Signs: There were no vitals taken for this visit.  Specialty Comments:  No specialty comments available.  PMFS History: Patient Active Problem List   Diagnosis Date Noted  . Dyspnea 04/11/2019  . Belching 04/11/2019  . Constipation 04/11/2019  . Essential hypertension 11/02/2018  . Lupus (HCC) 11/02/2018  . Primary osteoarthritis of both knees 06/06/2018  . Cough variant asthma 08/25/2017  . Systemic lupus erythematosus (HCC) 02/18/2017  . High risk medication use 02/18/2017  . Raynaud's disease without gangrene 02/18/2017  . DDD (degenerative disc disease), lumbar 02/18/2017  .  Primary osteoarthritis of both hands 02/18/2017  . Primary osteoarthritis of both feet 02/18/2017  . Vitamin D deficiency 02/18/2017  . History of hypertension 02/18/2017  . History of neuropathy 02/18/2017   Past Medical History:  Diagnosis Date  . Asthma   . Hypertension   . Lupus (HCC)     Family History  Problem Relation Age of Onset  . Heart attack Mother   . Stomach cancer Father   . Hypertension Sister   . Heart disease Sister     Past Surgical History:  Procedure Laterality Date  . ABDOMINAL HYSTERECTOMY    . CHOLECYSTECTOMY    . SHOULDER ARTHROSCOPY W/ ROTATOR CUFF REPAIR     R shoulder  . TONSILLECTOMY      Social History   Occupational History  . Occupation: retired  Tobacco Use  . Smoking status: Never Smoker  . Smokeless tobacco: Never Used  Vaping Use  . Vaping Use: Never used  Substance and Sexual Activity  . Alcohol use: No  . Drug use: No  . Sexual activity: Not Currently    Birth control/protection: Surgical

## 2020-12-09 ENCOUNTER — Ambulatory Visit: Payer: Medicare PPO

## 2020-12-16 NOTE — Chronic Care Management (AMB) (Signed)
  Chronic Care Management   Note  12/16/2020 Name: Jessica Booker MRN: [id number] DOB: March 01, 1941  Jessica Booker is a 80 y.o. year old female who is a primary care patient of Glendale Chard, MD. I reached out to [name] by phone today in response to a referral sent by Ms. Dorann Ou Pechacek's PCP, Dr. Baird Cancer.     Ms. Forbush was given information about Chronic Care Management services today including:  1. CCM service includes personalized support from designated clinical staff supervised by her physician, including individualized plan of care and coordination with other care providers 2. 24/7 contact phone numbers for assistance for urgent and routine care needs. 3. Service will only be billed when office clinical staff spend 20 minutes or more in a month to coordinate care. 4. Only one practitioner may furnish and bill the service in a calendar month. 5. The patient may stop CCM services at any time (effective at the end of the month) by phone call to the office staff. 6. The patient will be responsible for cost sharing (co-pay) of up to 20% of the service fee (after annual deductible is met).  Patient agreed to services and verbal consent obtained.   Follow up plan: Telephone appointment with care management team member scheduled for:01/01/2021  Adja Ruff  Care Guide, Embedded Care Coordination [place]  Care Management

## 2020-12-18 ENCOUNTER — Telehealth: Payer: Self-pay | Admitting: Internal Medicine

## 2020-12-18 NOTE — Telephone Encounter (Signed)
Left message for patient to call back and schedule Medicare Annual Wellness Visit (AWV) either virtually or in office.   Last AWV 11/14/19  please schedule at anytime with [facility]A - [facility]    This should be a 45 minute visit.  

## 2021-01-01 ENCOUNTER — Ambulatory Visit (INDEPENDENT_AMBULATORY_CARE_PROVIDER_SITE_OTHER): Payer: Medicare PPO

## 2021-01-01 ENCOUNTER — Telehealth: Payer: Medicare PPO

## 2021-01-01 ENCOUNTER — Ambulatory Visit: Payer: Self-pay

## 2021-01-01 DIAGNOSIS — F5101 Primary insomnia: Secondary | ICD-10-CM

## 2021-01-01 DIAGNOSIS — M3219 Other organ or system involvement in systemic lupus erythematosus: Secondary | ICD-10-CM

## 2021-01-01 DIAGNOSIS — I10 Essential (primary) hypertension: Secondary | ICD-10-CM

## 2021-01-01 NOTE — Chronic Care Management (AMB) (Signed)
Chronic Care Management   CCM RN Visit Note  01/01/2021 Name: Jessica Booker MRN: [id number] DOB: 1940-08-31  Subjective: Jessica Booker is a 80 y.o. year old female who is a primary care patient of Glendale Chard, MD. The care management team was consulted for assistance with disease management and care coordination needs.    Collaboration with Daneen Schick BSW  for Case Collaboration  in response to provider referral for case management and/or care coordination services.   Consent to Services:  The patient was given the following information about Chronic Care Management services today, agreed to services, and gave verbal consent: 1. CCM service includes personalized support from designated clinical staff supervised by the primary care provider, including individualized plan of care and coordination with other care providers 2. 24/7 contact phone numbers for assistance for urgent and routine care needs. 3. Service will only be billed when office clinical staff spend 20 minutes or more in a month to coordinate care. 4. Only one practitioner may furnish and bill the service in a calendar month. 5.The patient may stop CCM services at any time (effective at the end of the month) by phone call to the office staff. 6. The patient will be responsible for cost sharing (co-pay) of up to 20% of the service fee (after annual deductible is met). Patient agreed to services and consent obtained.  Patient agreed to services and verbal consent obtained.   Assessment: Review of patient past medical history, allergies, medications, health status, including review of consultants reports, laboratory and other test data, was performed as part of comprehensive evaluation and provision of chronic care management services.   SDOH (Social Determinants of Health) assessments and interventions performed:    CCM Care Plan  No Known Allergies  Outpatient Encounter Medications as of 01/01/2021  Medication Sig  .  aspirin 81 MG tablet Take 81 mg by mouth daily. Takes as needed  . diclofenac sodium (VOLTAREN) 1 % GEL Apply 2 g to 4 g to affected joint up to 4 times daily PRN.  . hydroxychloroquine (PLAQUENIL) 200 MG tablet TAKE 1 TABLET BY MOUTH TWICE DAILY MONDAY THROUGH FRIDAY ONLY.  Marland Kitchen linaclotide (LINZESS) 72 MCG capsule Take 72 mcg by mouth as needed.   . loratadine (CLARITIN) 10 MG tablet TAKE 1 TABLET BY MOUTH EVERY DAY (Patient taking differently: as needed.)  . NIFEdipine (PROCARDIA XL/NIFEDICAL XL) 60 MG 24 hr tablet TAKE 1 TABLET BY MOUTH EVERY DAY  . polyethylene glycol (MIRALAX / GLYCOLAX) 17 g packet Take 17 g by mouth daily. (Patient taking differently: Take 17 g by mouth as needed.)  . valsartan-hydrochlorothiazide (DIOVAN-HCT) 320-12.5 MG tablet TAKE 1 TABLET BY MOUTH EVERY DAY IN THE MORNING   No facility-administered encounter medications on file as of 01/01/2021.    Patient Active Problem List   Diagnosis Date Noted  . Dyspnea 04/11/2019  . Belching 04/11/2019  . Constipation 04/11/2019  . Essential hypertension 11/02/2018  . Lupus ([place]) 11/02/2018  . Primary osteoarthritis of both knees 06/06/2018  . Cough variant asthma 08/25/2017  . Systemic lupus erythematosus ([place]) 02/18/2017  . High risk medication use 02/18/2017  . Raynaud's disease without gangrene 02/18/2017  . DDD (degenerative disc disease), lumbar 02/18/2017  . Primary osteoarthritis of both hands 02/18/2017  . Primary osteoarthritis of both feet 02/18/2017  . Vitamin D deficiency 02/18/2017  . History of hypertension 02/18/2017  . History of neuropathy 02/18/2017    Conditions to be addressed/monitored:Essential hypertension, Systemic Lupus erythematosus with other organ  involvement, Primary in [place] : Assist with Chronic Care Management and Care Coordination needs  Updates made by Lynne Logan, RN since 01/01/2021 12:00 AM    Problem: Assist with Chronic Care Management and Care Coordination needs    Priority: High    Goal: Assist with Chronic Care Management and Care Coordination needs   Start Date: 01/01/2021  Expected End Date: 02/01/2021  This Visit's Progress: On track  Priority: High  Note:   Current Barriers:   Chronic Disease Management support, education, and care coordination needs related to Essential hypertension, Systemic Lupus erythematosus with other organ involvement, Primary in [place], with SW and Pharmacy Care Management and Care coordination needs Case Manager Clinical Goal(s):   Patient will work with BSW to address needs related to  care coordination needs related to Essential hypertension, Systemic Lupus erythematosus with other organ involvement, Primary in [place], with SW and Pharmacy Care Management and Care Coordination needs Interventions:   Collaboration with Glendale Chard, MD regarding development and update of comprehensive plan of care as evidenced by provider attestation and co-signature  Inter-disciplinary care team collaboration (see longitudinal plan of care)  Collaborated with BSW to initiate plan of care to address needs related to care coordination needs related to Essential hypertension, Systemic Lupus erythematosus with other organ involvement, Primary in [place], with SW and Pharmacy Care Management and Care Coordination needs Patient Goals/Self-Care Activities . Patient will:   - Patient will work with the CCM team to address care coordination needs and will continue to work with the clinical team to address health care and disease management related needs.    Follow Up Plan: The care management team will reach out to the patient again over the next 30 days.      Plan:Telephone follow up appointment with care management team member scheduled for:  01/29/21  Barb Merino, RN, BSN, CCM Care Management Coordinator [place] Management/[facility]  Direct Phone: [phone]

## 2021-01-01 NOTE — Patient Instructions (Signed)
Social Worker Visit Information  Goals we discussed today:  Goals Addressed            This Visit's Progress   . Quality of life maintained       Timeframe:  Long-Range Goal Priority:  Low Start Date:  5.5.22                           Expected End Date: 9.2.22                       Next planned outreach: 8.5.22  Patient Goals/Self-Care Activities . patient will:   - Patient will self administer medications as prescribed Patient will attend all scheduled provider appointments Patient will call provider office for new concerns or questions Contact SW as needed prior to next scheduled call        Materials provided: Verbal education about CCM program provided by phone  Ms. Ringle was given information about Chronic Care Management services today including:  1. CCM service includes personalized support from designated clinical staff supervised by her physician, including individualized plan of care and coordination with other care providers 2. 24/7 contact phone numbers for assistance for urgent and routine care needs. 3. Service will only be billed when office clinical staff spend 20 minutes or more in a month to coordinate care. 4. Only one practitioner may furnish and bill the service in a calendar month. 5. The patient may stop CCM services at any time (effective at the end of the month) by phone call to the office staff. 6. The patient will be responsible for cost sharing (co-pay) of up to 20% of the service fee (after annual deductible is met).  Patient agreed to services and verbal consent obtained.   Patient verbalizes understanding of instructions provided today and agrees to view in [place].   Follow up plan: SW will follow up with patient by phone over the next 90 days   Daneen Schick, BSW, CDP Social Worker, Certified Dementia Practitioner [place] [facility] [place] Management [phone]

## 2021-01-01 NOTE — Chronic Care Management (AMB) (Signed)
Chronic Care Management    Social Work Note  01/01/2021 Name: Jessica Booker MRN: [id number] DOB: Nov 10, 1940  Jessica Booker is a 80 y.o. year old female who is a primary care patient of Glendale Chard, MD. The CCM team was consulted to assist the patient with chronic disease management and/or care coordination needs related to: HTN and Lupus.   Engaged with patient by telephone for initial visit in response to provider referral for social work chronic care management and care coordination services.   Consent to Services:  The patient was given the following information about Chronic Care Management services today, agreed to services, and gave verbal consent: 1. CCM service includes personalized support from designated clinical staff supervised by the primary care provider, including individualized plan of care and coordination with other care providers 2. 24/7 contact phone numbers for assistance for urgent and routine care needs. 3. Service will only be billed when office clinical staff spend 20 minutes or more in a month to coordinate care. 4. Only one practitioner may furnish and bill the service in a calendar month. 5.The patient may stop CCM services at any time (effective at the end of the month) by phone call to the office staff. 6. The patient will be responsible for cost sharing (co-pay) of up to 20% of the service fee (after annual deductible is met). Patient agreed to services and consent obtained.  Patient agreed to services and consent obtained.   Assessment: Review of patient past medical history, allergies, medications, and health status, including review of relevant consultants reports was performed today as part of a comprehensive evaluation and provision of chronic care management and care coordination services.     SDOH (Social Determinants of Health) assessments and interventions performed:  SDOH Interventions   Flowsheet Row Most Recent Value  SDOH Interventions   Food  Insecurity Interventions Intervention Not Indicated  Housing Interventions Intervention Not Indicated  Transportation Interventions Intervention Not Indicated       Advanced Directives Status: Not addressed in this encounter.  CCM Care Plan  No Known Allergies  Outpatient Encounter Medications as of 01/01/2021  Medication Sig  . aspirin 81 MG tablet Take 81 mg by mouth daily. Takes as needed  . diclofenac sodium (VOLTAREN) 1 % GEL Apply 2 g to 4 g to affected joint up to 4 times daily PRN.  . hydroxychloroquine (PLAQUENIL) 200 MG tablet TAKE 1 TABLET BY MOUTH TWICE DAILY MONDAY THROUGH FRIDAY ONLY.  Marland Kitchen linaclotide (LINZESS) 72 MCG capsule Take 72 mcg by mouth as needed.   . loratadine (CLARITIN) 10 MG tablet TAKE 1 TABLET BY MOUTH EVERY DAY (Patient taking differently: as needed.)  . NIFEdipine (PROCARDIA XL/NIFEDICAL XL) 60 MG 24 hr tablet TAKE 1 TABLET BY MOUTH EVERY DAY  . polyethylene glycol (MIRALAX / GLYCOLAX) 17 g packet Take 17 g by mouth daily. (Patient taking differently: Take 17 g by mouth as needed.)  . valsartan-hydrochlorothiazide (DIOVAN-HCT) 320-12.5 MG tablet TAKE 1 TABLET BY MOUTH EVERY DAY IN THE MORNING   No facility-administered encounter medications on file as of 01/01/2021.    Patient Active Problem List   Diagnosis Date Noted  . Dyspnea 04/11/2019  . Belching 04/11/2019  . Constipation 04/11/2019  . Essential hypertension 11/02/2018  . Lupus ([place]) 11/02/2018  . Primary osteoarthritis of both knees 06/06/2018  . Cough variant asthma 08/25/2017  . Systemic lupus erythematosus ([place]) 02/18/2017  . High risk medication use 02/18/2017  . Raynaud's disease without gangrene 02/18/2017  .  DDD (degenerative disc disease), lumbar 02/18/2017  . Primary osteoarthritis of both hands 02/18/2017  . Primary osteoarthritis of both feet 02/18/2017  . Vitamin D deficiency 02/18/2017  . History of hypertension 02/18/2017  . History of neuropathy 02/18/2017    Conditions to  be addressed/monitored: HTN and Lupus  Care Plan : Social Work [facility] Care Plan  Updates made by Daneen Schick since 01/01/2021 12:00 AM    Problem: Quality of Life (General Plan of Care)     Long-Range Goal: Quality of Life Maintained   Start Date: 01/01/2021  Expected End Date: 05/01/2021  This Visit's Progress: On track  Priority: Low  Note:   Current Barriers:  . Chronic disease management support and education needs related to HTN and Lupus    Social Worker Clinical Goal(s):  Marland Kitchen Over the next 45 days the patient will engage with RN Care Manager to establish an individualized plan of care  . Over the next 120 days the patient will work with SW to identify and address any acute and/or chronic care coordination needs related to the self health management of HTN and Lupus    SW Interventions:  . Inter-disciplinary care team collaboration (see longitudinal plan of care) . Collaboration with Glendale Chard, MD regarding development and update of comprehensive plan of care as evidenced by provider attestation and co-signature . Successful outbound call placed to the patient to conduct an SDoH screen . Determined there are no challenges at this time . Discussed long term follow up with SW while patient engages with  RN Case Manager  to address care management needs . Collaboration with RN Care Manager to discuss plan . Scheduled follow up call over the next 90 days  Patient Goals/Self-Care Activities . patient will:   - Patient will self administer medications as prescribed Patient will attend all scheduled provider appointments Patient will call provider office for new concerns or questions Contact SW as needed prior to next scheduled call Follow Up Plan:  SW will follow up with the patient over the next 90 days       Follow Up Plan: SW will follow up with patient by phone over the next 90 days      Daneen Schick, BSW, CDP Social Worker, Certified Dementia Practitioner [place] [facility] [place] [facility] [phone]  Total time spent performing care coordination and/or care management activities with the patient by phone or face to face = 39 minutes.

## 2021-01-05 ENCOUNTER — Encounter: Payer: Self-pay | Admitting: Orthopedic Surgery

## 2021-01-05 ENCOUNTER — Ambulatory Visit: Payer: Self-pay

## 2021-01-05 ENCOUNTER — Ambulatory Visit: Payer: Medicare PPO | Admitting: Physician Assistant

## 2021-01-05 DIAGNOSIS — M79671 Pain in right foot: Secondary | ICD-10-CM | POA: Diagnosis not present

## 2021-01-05 NOTE — Progress Notes (Signed)
Office Visit Note   Patient: Jessica Booker           Date of Birth: 1940/11/09           MRN: [id number] Visit Date: 01/05/2021              Requested by: Dorothyann Peng, MD [address] [place],  [place] [address] PCP: Dorothyann Peng, MD  Chief Complaint  Patient presents with  . Right Foot - Follow-up      HPI: Patient is a pleasant 80 year old woman who comes up in follow-up for her right foot.  She did have a significant Achilles contracture at her last visit causing forefoot overload.  She was instructed in stretching.  She has been doing this as asked.  She still continues to have some pain under the fifth MTP joint.  She said several years ago she did have a bug bite in this area.  She does not have any pain whatsoever at during the day but has pain at night  Assessment & Plan: Visit Diagnoses:  1. Pain in right foot     Plan: She is significantly improved with regards to her Achilles contracture.  She is wearing good supportive shoes.  I suggested trying just a small amount of anti-inflammatory cream before she goes to bed at night we will follow-up in 4 weeks  Follow-Up Instructions: No follow-ups on file.   Ortho Exam  Patient is alert, oriented, no adenopathy, well-dressed, normal affect, normal respiratory effort. Examination demonstrates no swelling no erythema no cellulitis.  She has slight callusing in the fourth webspace but no breakdown in skin.  She is tender with manipulation of the fifth MTP joint.  She has dorsiflexion to 10 degrees past neutral.  Imaging: XR Foot 2 Views Right  Result Date: 01/05/2021 X-rays of her right foot demonstrate overall well-maintained alignment.  No osseous changes.  No images are attached to the encounter.  Labs: Lab Results  Component Value Date   ESRSEDRATE 19 06/27/2020   ESRSEDRATE 31 (H) 04/05/2019   ESRSEDRATE 31 (H) 10/30/2018     Lab Results  Component Value Date   ALBUMIN 4.4 11/20/2020    ALBUMIN 4.5 11/14/2019   ALBUMIN 4.5 04/11/2019    No results found for: MG Lab Results  Component Value Date   VD25OH 31 10/30/2018   VD25OH 23 (L) 05/25/2018   VD25OH 41 10/26/2017    No results found for: PREALBUMIN CBC EXTENDED Latest Ref Rng & Units 11/20/2020 06/27/2020 11/14/2019  WBC 3.4 - 10.8 x10E3/uL 7.2 6.1 7.3  RBC 3.77 - 5.28 x10E6/uL 4.51 4.37 4.42  HGB 11.1 - 15.9 g/dL 84.1 11.5(L) 12.0  HCT 34.0 - 46.6 % 36.3 36.3 36.0  PLT 150 - 450 x10E3/uL 262 300 277  NEUTROABS 1.4 - 7.0 x10E3/uL 3.7 3,788 4.2  LYMPHSABS 0.7 - 3.1 x10E3/uL 2.6 1,720 2.3     There is no height or weight on file to calculate BMI.  Orders:  Orders Placed This Encounter  Procedures  . XR Foot 2 Views Right   No orders of the defined types were placed in this encounter.    Procedures: No procedures performed  Clinical Data: No additional findings.  ROS:  All other systems negative, except as noted in the HPI. Review of Systems  Objective: Vital Signs: There were no vitals taken for this visit.  Specialty Comments:  No specialty comments available.  PMFS History: Patient Active Problem List   Diagnosis Date  Noted  . Dyspnea 04/11/2019  . Belching 04/11/2019  . Constipation 04/11/2019  . Essential hypertension 11/02/2018  . Lupus (HCC) 11/02/2018  . Primary osteoarthritis of both knees 06/06/2018  . Cough variant asthma 08/25/2017  . Systemic lupus erythematosus (HCC) 02/18/2017  . High risk medication use 02/18/2017  . Raynaud's disease without gangrene 02/18/2017  . DDD (degenerative disc disease), lumbar 02/18/2017  . Primary osteoarthritis of both hands 02/18/2017  . Primary osteoarthritis of both feet 02/18/2017  . Vitamin D deficiency 02/18/2017  . History of hypertension 02/18/2017  . History of neuropathy 02/18/2017   Past Medical History:  Diagnosis Date  . Asthma   . Hypertension   . Lupus (HCC)     Family History  Problem Relation Age of Onset  .  Heart attack Mother   . Stomach cancer Father   . Hypertension Sister   . Heart disease Sister     Past Surgical History:  Procedure Laterality Date  . ABDOMINAL HYSTERECTOMY    . CHOLECYSTECTOMY    . SHOULDER ARTHROSCOPY W/ ROTATOR CUFF REPAIR     R shoulder  . TONSILLECTOMY     Social History   Occupational History  . Occupation: retired  Tobacco Use  . Smoking status: Never Smoker  . Smokeless tobacco: Never Used  Vaping Use  . Vaping Use: Never used  Substance and Sexual Activity  . Alcohol use: No  . Drug use: No  . Sexual activity: Not Currently    Birth control/protection: Surgical

## 2021-01-05 NOTE — Progress Notes (Signed)
Office Visit Note  Patient: Jessica Booker             Date of Birth: 24-Sep-1940           MRN: [id number]             PCP: Dorothyann Peng, MD Referring: Dorothyann Peng, MD Visit Date: 01/19/2021 Occupation: @GUAROCC @  Subjective:  Other (Patient reports fall on Saturday. Right knee pain and swelling. Left shoulder pain and neck pain )   History of Present Illness: Jessica Booker is a 80 y.o. female with a history of systemic lupus erythematosus, osteoarthritis and degenerative disc disease.  She states last Saturday she was potting flowers on her deck.  She  missed a step and landed up on her knees.  She noticed swelling in her knees right after the fall especially in her right knee joint.  She iced her knee joints and also soaked in Epson salt.  The pain and swelling has gradually improving.  She has been also having some discomfort in her left shoulder since the fall.  None of the other joints are painful or swollen.  She continues to have some discomfort in her neck.  The lower back pain is manageable.  She denies any history of  Activities of Daily Living:  Patient reports morning stiffness for 0 minutes.   Patient Reports nocturnal pain.  Difficulty dressing/grooming: Denies Difficulty climbing stairs: Denies Difficulty getting out of chair: Denies Difficulty using hands for taps, buttons, cutlery, and/or writing: Denies  Review of Systems  Constitutional: Negative for fatigue, night sweats, weight gain and weight loss.  HENT: Positive for mouth dryness. Negative for mouth sores, trouble swallowing, trouble swallowing and nose dryness.   Eyes: Negative for pain, redness, itching, visual disturbance and dryness.  Respiratory: Negative for cough, shortness of breath and difficulty breathing.   Cardiovascular: Negative for chest pain, palpitations, hypertension, irregular heartbeat and swelling in legs/feet.  Gastrointestinal: Negative for blood in stool, constipation and  diarrhea.  Endocrine: Negative for increased urination.  Genitourinary: Negative for difficulty urinating and vaginal dryness.  Musculoskeletal: Positive for arthralgias, joint pain and joint swelling. Negative for myalgias, muscle weakness, morning stiffness, muscle tenderness and myalgias.  Skin: Positive for color change. Negative for rash, hair loss, redness, skin tightness, ulcers and sensitivity to sunlight.  Allergic/Immunologic: Negative for susceptible to infections.  Neurological: Positive for numbness. Negative for dizziness, headaches, memory loss, night sweats and weakness.  Hematological: Negative for bruising/bleeding tendency and swollen glands.  Psychiatric/Behavioral: Negative for depressed mood, confusion and sleep disturbance. The patient is not nervous/anxious.     PMFS History:  Patient Active Problem List   Diagnosis Date Noted  . Dyspnea 04/11/2019  . Belching 04/11/2019  . Constipation 04/11/2019  . Essential hypertension 11/02/2018  . Lupus (HCC) 11/02/2018  . Primary osteoarthritis of both knees 06/06/2018  . Cough variant asthma 08/25/2017  . Systemic lupus erythematosus (HCC) 02/18/2017  . High risk medication use 02/18/2017  . Raynaud's disease without gangrene 02/18/2017  . DDD (degenerative disc disease), lumbar 02/18/2017  . Primary osteoarthritis of both hands 02/18/2017  . Primary osteoarthritis of both feet 02/18/2017  . Vitamin D deficiency 02/18/2017  . History of hypertension 02/18/2017  . History of neuropathy 02/18/2017    Past Medical History:  Diagnosis Date  . Asthma   . Hypertension   . Lupus (HCC)     Family History  Problem Relation Age of Onset  . Heart attack Mother   .  Stomach cancer Father   . Hypertension Sister   . Heart disease Sister    Past Surgical History:  Procedure Laterality Date  . ABDOMINAL HYSTERECTOMY    . CHOLECYSTECTOMY    . SHOULDER ARTHROSCOPY W/ ROTATOR CUFF REPAIR     R shoulder  . TONSILLECTOMY      Social History   Social History Narrative  . Not on file   Immunization History  Administered Date(s) Administered  . Influenza, High Dose Seasonal PF 06/21/2017, 05/29/2018, 05/16/2019  . Influenza-Unspecified 05/30/2018, 05/16/2019, 05/13/2020  . PFIZER Comirnaty(Gray Top)Covid-19 Tri-Sucrose Vaccine 12/25/2020  . PFIZER(Purple Top)SARS-COV-2 Vaccination 10/04/2019, 10/30/2019, 05/23/2020  . Pneumococcal Conjugate-13 07/06/2019  . Pneumococcal Polysaccharide-23 09/17/2013  . Zoster 10/17/2020  . Zoster Recombinat (Shingrix) 05/29/2018, 08/01/2018     Objective: Vital Signs: BP (!) 160/74 (BP Location: Right Arm, Patient Position: Sitting, Cuff Size: Normal)   Pulse 64   Ht 5' 4.5" (1.638 m)   Wt 175 lb 3.2 oz (79.5 kg)   BMI 29.61 kg/m    Physical Exam Vitals and nursing note reviewed.  Constitutional:      Appearance: She is well-developed.  HENT:     Head: Normocephalic and atraumatic.  Eyes:     Conjunctiva/sclera: Conjunctivae normal.  Cardiovascular:     Rate and Rhythm: Normal rate and regular rhythm.     Heart sounds: Normal heart sounds.  Pulmonary:     Effort: Pulmonary effort is normal.     Breath sounds: Normal breath sounds.  Abdominal:     General: Bowel sounds are normal.     Palpations: Abdomen is soft.  Musculoskeletal:     Cervical back: Normal range of motion.  Lymphadenopathy:     Cervical: No cervical adenopathy.  Skin:    General: Skin is warm and dry.     Capillary Refill: Capillary refill takes less than 2 seconds.  Neurological:     Mental Status: She is alert and oriented to person, place, and time.  Psychiatric:        Behavior: Behavior normal.      Musculoskeletal Exam: She has stiffness and discomfort range of motion of the cervical spine.  She discomfort range of motion for lumbar spine.  Shoulder joints, elbow joints, wrist joints with good range of motion.  She had bilateral DIP thickening with subluxation of some of the  joints.  Hip joints with good range of motion.  She had hematoma over the lateral aspect of her right knee.  She has some bruising on her left knee.  No warmth or swelling was noted.  There was no tenderness over ankles or MTPs.  CDAI Exam: CDAI Score: -- Patient Global: --; Provider Global: -- Swollen: --; Tender: -- Joint Exam 01/19/2021   No joint exam has been documented for this visit   There is currently no information documented on the homunculus. Go to the Rheumatology activity and complete the homunculus joint exam.  Investigation: No additional findings.  Imaging: XR Cervical Spine 2 or 3 views  Result Date: 01/19/2021 Multilevel spondylosis with narrowing of C3-C4, C4-C5, C5-C6, C6-C7 was noted.  Facet joint arthropathy was noted.  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  XR Foot 2 Views Right  Result Date: 01/05/2021 X-rays of her right foot demonstrate overall well-maintained alignment.  No osseous changes.   Recent Labs: Lab Results  Component Value Date   WBC 7.2 11/20/2020   HGB 11.7 11/20/2020   PLT 262 11/20/2020  NA 138 11/20/2020   K 3.9 11/20/2020   CL 100 11/20/2020   CO2 24 11/20/2020   GLUCOSE 79 11/20/2020   BUN 18 11/20/2020   CREATININE 0.90 11/20/2020   BILITOT 0.4 11/20/2020   ALKPHOS 106 11/20/2020   AST 20 11/20/2020   ALT 10 11/20/2020   PROT 7.6 11/20/2020   ALBUMIN 4.4 11/20/2020   CALCIUM 9.5 11/20/2020   GFRAA 73 06/27/2020    Speciality Comments: PLQ Eye Exam: 01/12/2021 WNL @ [facility] Follow up in 1 year.  Procedures:  No procedures performed Allergies: Patient has no known allergies.   Assessment / Plan:     Visit Diagnoses: Other systemic lupus erythematosus with other organ involvement (HCC) - -ANA, positive Smith, and positive Ro and RNP: She is clinically doing well with no lupus flare.  I will obtain autoimmune antibodies today.- Plan: Protein / creatinine ratio,  urine, C3 and C4, Anti-DNA antibody, double-stranded, Sedimentation rate, RNP Antibody, Anti-Smith antibody, Sjogrens syndrome-A extractable nuclear antibody  High risk medication use - PLQ 200 mg 1 tablet by mouth twice daily M-F. PLQ Eye Exam: 01/11/2020.  She has been tolerating medication well.  Labs in March CBC with differential and CMP with GFR were normal.  Raynaud's disease without gangrene-Raynauds is currently not active.  Primary osteoarthritis of both hands-joint protection muscle strengthening was discussed.  She has DIP thickening.  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  Neck stiffness -she has been experiencing increased pain and stiffness in her neck.  She requests getting x-rays of her C-spine.  Plan: XR Cervical Spine 2 or 3 views.  Multilevel severe spondylosis was noted.  I will refer her to physical therapy.  Note on cervical spine exercises was given.  She was advised to contact us in case her symptoms persist or worsen.  In that case it may need further evaluation.  DDD (degenerative disc disease), lumbar-she has off-and-on discomfort.  History of recent fall-she had a recent fall and landed up on her knees on the deck.  She has hematoma on her right knee and has small bruise on the left knee.  No warmth or effusion was noted in the knee joint.  I will refer her to physical therapy for fall prevention.  History of neuropathy  Vitamin D deficiency  History of hypertension-blood pressure is elevated today.  She believes is because of discomfort.  Orders: Orders Placed This Encounter  Procedures  . XR Cervical Spine 2 or 3 views  . Protein / creatinine ratio, urine  . C3 and C4  . Anti-DNA antibody, double-stranded  . Sedimentation rate  . RNP Antibody  . Anti-Smith antibody  . Sjogrens syndrome-A extractable nuclear antibody  . Ambulatory referral to Physical  Therapy   No orders of the defined types were placed in this encounter.     Follow-Up Instructions: Return in about 4 months (around 05/22/2021) for Systemic lupus.   Pollyann Savoy, MD  Note - This record has been created using Animal nutritionist.  Chart creation errors have been sought, but may not always  have been located. Such creation errors do not reflect on  the standard of medical care.

## 2021-01-12 ENCOUNTER — Other Ambulatory Visit: Payer: Self-pay | Admitting: Internal Medicine

## 2021-01-12 DIAGNOSIS — Z79899 Other long term (current) drug therapy: Secondary | ICD-10-CM | POA: Diagnosis not present

## 2021-01-12 DIAGNOSIS — H43811 Vitreous degeneration, right eye: Secondary | ICD-10-CM | POA: Diagnosis not present

## 2021-01-12 DIAGNOSIS — H52203 Unspecified astigmatism, bilateral: Secondary | ICD-10-CM | POA: Diagnosis not present

## 2021-01-12 DIAGNOSIS — H25012 Cortical age-related cataract, left eye: Secondary | ICD-10-CM | POA: Diagnosis not present

## 2021-01-14 ENCOUNTER — Telehealth: Payer: Self-pay

## 2021-01-14 NOTE — Telephone Encounter (Signed)
This nurse called patient in regards to missed AWV. She stated that she thought it was over the phone. It was scheduled as an in office visit. We rescheduled for 02/18/2021 at 8:30 am. States that she will come in to the office for appointment.

## 2021-01-19 ENCOUNTER — Encounter: Payer: Self-pay | Admitting: Rheumatology

## 2021-01-19 ENCOUNTER — Ambulatory Visit: Payer: Medicare PPO | Admitting: Rheumatology

## 2021-01-19 ENCOUNTER — Ambulatory Visit: Payer: Self-pay

## 2021-01-19 ENCOUNTER — Other Ambulatory Visit: Payer: Self-pay

## 2021-01-19 VITALS — BP 160/74 | HR 64 | Ht 64.5 in | Wt 175.2 lb

## 2021-01-19 DIAGNOSIS — I7300 Raynaud's syndrome without gangrene: Secondary | ICD-10-CM | POA: Diagnosis not present

## 2021-01-19 DIAGNOSIS — Z8679 Personal history of other diseases of the circulatory system: Secondary | ICD-10-CM

## 2021-01-19 DIAGNOSIS — Z9181 History of falling: Secondary | ICD-10-CM

## 2021-01-19 DIAGNOSIS — Z79899 Other long term (current) drug therapy: Secondary | ICD-10-CM | POA: Diagnosis not present

## 2021-01-19 DIAGNOSIS — M436 Torticollis: Secondary | ICD-10-CM | POA: Diagnosis not present

## 2021-01-19 DIAGNOSIS — Z8669 Personal history of other diseases of the nervous system and sense organs: Secondary | ICD-10-CM

## 2021-01-19 DIAGNOSIS — M19041 Primary osteoarthritis, right hand: Secondary | ICD-10-CM | POA: Diagnosis not present

## 2021-01-19 DIAGNOSIS — M19071 Primary osteoarthritis, right ankle and foot: Secondary | ICD-10-CM | POA: Diagnosis not present

## 2021-01-19 DIAGNOSIS — M19042 Primary osteoarthritis, left hand: Secondary | ICD-10-CM

## 2021-01-19 DIAGNOSIS — M5136 Other intervertebral disc degeneration, lumbar region: Secondary | ICD-10-CM

## 2021-01-19 DIAGNOSIS — M3219 Other organ or system involvement in systemic lupus erythematosus: Secondary | ICD-10-CM

## 2021-01-19 DIAGNOSIS — M170 Bilateral primary osteoarthritis of knee: Secondary | ICD-10-CM | POA: Diagnosis not present

## 2021-01-19 DIAGNOSIS — M19072 Primary osteoarthritis, left ankle and foot: Secondary | ICD-10-CM

## 2021-01-19 DIAGNOSIS — E559 Vitamin D deficiency, unspecified: Secondary | ICD-10-CM

## 2021-01-19 DIAGNOSIS — G8929 Other chronic pain: Secondary | ICD-10-CM

## 2021-01-19 NOTE — Patient Instructions (Signed)

## 2021-01-20 LAB — ANTI-SMITH ANTIBODY: ENA SM Ab Ser-aCnc: <1 AI

## 2021-01-20 LAB — SJOGRENS SYNDROME-A EXTRACTABLE NUCLEAR ANTIBODY: SSA (Ro) (ENA) Antibody, IgG: >8 AI — AB

## 2021-01-20 LAB — C3 AND C4
C3 Complement: 125 mg/dL (ref 83–193)
C4 Complement: 30 mg/dL (ref 15–57)

## 2021-01-20 LAB — RNP ANTIBODY: Ribonucleic Protein(ENA) Antibody, IgG: <1 AI

## 2021-01-20 LAB — ANTI-DNA ANTIBODY, DOUBLE-STRANDED: ds DNA Ab: 3 IU/mL

## 2021-01-20 LAB — SEDIMENTATION RATE: Sed Rate: 11 mm/h (ref 0–30)

## 2021-01-20 NOTE — Progress Notes (Signed)
Please check if urine protein creatinine ratio was run by the lab.(It shows as clinic collect instead of lab collect)

## 2021-01-21 ENCOUNTER — Other Ambulatory Visit: Payer: Self-pay | Admitting: *Deleted

## 2021-01-21 DIAGNOSIS — Z79899 Other long term (current) drug therapy: Secondary | ICD-10-CM

## 2021-01-21 DIAGNOSIS — I7300 Raynaud's syndrome without gangrene: Secondary | ICD-10-CM

## 2021-01-21 DIAGNOSIS — M3219 Other organ or system involvement in systemic lupus erythematosus: Secondary | ICD-10-CM

## 2021-01-21 NOTE — Progress Notes (Signed)
Complements are normal, double-stranded DNA is negative, sed rate is normal, RNP is negative, Smith negative, Ro antibody which is associated with Sjogren's is positive.  We can check urine protein creatinine ratio at the follow-up visit.  No change in treatment advised.  Labs do not indicate an active disease.

## 2021-01-29 ENCOUNTER — Telehealth: Payer: Medicare PPO

## 2021-01-29 ENCOUNTER — Telehealth: Payer: Self-pay

## 2021-01-29 NOTE — Telephone Encounter (Signed)
  Care Management   Follow Up Note   01/29/2021 Name: Jessica Booker MRN: [id number] DOB: Jan 15, 1941   Referred by: Dorothyann Peng, MD Reason for referral : Chronic Care Management (RNCM Initial Call - 1st attempt )   An unsuccessful telephone outreach was attempted today. The patient was referred to the case management team for assistance with care management and care coordination.   Follow Up Plan: A HIPPA compliant phone message was left for the patient providing contact information and requesting a return call.   Delsa Sale, RN, BSN, CCM Care Management Coordinator [facility] Care Management/[facility]  Direct Phone: [phone]

## 2021-02-02 ENCOUNTER — Other Ambulatory Visit: Payer: Self-pay

## 2021-02-02 MED ORDER — LINACLOTIDE 72 MCG PO CAPS: 72.0000 ug | ORAL_CAPSULE | Freq: Every day | ORAL | 1 refills | Status: DC

## 2021-02-05 ENCOUNTER — Ambulatory Visit: Payer: Medicare PPO

## 2021-02-16 ENCOUNTER — Other Ambulatory Visit: Payer: Self-pay

## 2021-02-16 ENCOUNTER — Ambulatory Visit: Payer: Medicare PPO | Attending: Rheumatology

## 2021-02-16 DIAGNOSIS — M6281 Muscle weakness (generalized): Secondary | ICD-10-CM | POA: Diagnosis not present

## 2021-02-16 DIAGNOSIS — M542 Cervicalgia: Secondary | ICD-10-CM

## 2021-02-16 NOTE — Therapy (Signed)
[facility] Outpatient Rehabilitation [facility] [address] [place], [place], [address] Phone: [phone]   Fax:  [phone]  Physical Therapy Evaluation  Patient Details  Name: Jessica Booker MRN: [id number] Date of Birth: 09-11-1940 Referring Provider (PT): Pollyann Savoy, MD   Encounter Date: 02/16/2021   PT End of Session - 02/16/21 0745     Visit Number 1    Number of Visits 9    Date for PT Re-Evaluation 04/13/21    Authorization Type [employer] - FOTO 6th and 10th    Progress Note Due on Visit 10    PT Start Time 0747    PT Stop Time 0832    PT Time Calculation (min) 45 min    Activity Tolerance Patient tolerated treatment well    Behavior During Therapy [facility] for tasks assessed/performed             Past Medical History:  Diagnosis Date   Asthma    Hypertension    Lupus (HCC)     Past Surgical History:  Procedure Laterality Date   ABDOMINAL HYSTERECTOMY     CHOLECYSTECTOMY     SHOULDER ARTHROSCOPY W/ ROTATOR CUFF REPAIR     R shoulder   TONSILLECTOMY      There were no vitals filed for this visit.    Subjective Assessment - 02/16/21 0750     Subjective Pt is a pleasant 80 y/o F who presents to PT with reports of L sided neck discomfort made worse after recent fall. Neck pain was present prior to fall and has more of a chronic issue. Pain is mainly on L sided and increases with L rotation and L UE reaching. Generally does not feel unstready and contributes recent fall d/t shoes while gardening at home. Denies symptoms past L UE and denies bowel/bladder changes, saddle anesthesia, or UE parestheisas. Pt notes that she is the primary caregiver for her husband and does have some increases in pain when performing these duties.    Limitations Lifting;Reading    How long can you sit comfortably? indefinite    How long can you stand comfortably? indefinite    How long can you walk comfortably? indefinite    Diagnostic tests  cervical xray 01/19/21: Multilevel spondylosis with narrowing of C3-C4, C4-C5, C5-C6, C6-C7 was   noted.  Facet joint arthropathy was noted.       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    Patient Stated Goals Pt would like to decrease neck pain in order to improve comfort with home and community activities    Currently in Pain? Yes    Pain Score 8    10/10 at worst   Pain Location Neck    Pain Orientation Left    Pain Descriptors / Indicators Spasm;Sore    Pain Type Chronic pain    Pain Radiating Towards L shoulder    Pain Onset More than a month ago    Pain Frequency Constant    Aggravating Factors  laying on L side, L rotation, driving    Pain Relieving Factors exercise, heat                [facility] PT Assessment - 02/16/21 0001       Assessment   Medical Diagnosis M43.6 (ICD-10-CM) - Neck stiffness    Referring Provider (PT) Pollyann Savoy, MD    Hand Dominance Right      Precautions   Precautions None  Restrictions   Weight Bearing Restrictions No      Balance Screen   Has the patient fallen in the past 6 months Yes    How many times? one - fall while gardening at home    Has the patient had a decrease in activity level because of a fear of falling?  No    Is the patient reluctant to leave their home because of a fear of falling?  No      Home Environment   Living Environment Private residence    Living Arrangements Alone    Type of Home House    Home Access Stairs to enter    Entrance Stairs-Number of Steps 2    Home Layout Two level    Alternate Level Stairs-Number of Steps 15    Alternate Level Stairs-Rails Can reach both    Home Equipment [place] - single point      Prior Function   Level of Independence Independent;Independent with basic ADLs      Observation/Other Assessments   Focus on Therapeutic Outcomes (FOTO)  84% function; 55% predicted      ROM / Strength   AROM / PROM / Strength AROM;Strength       AROM   Overall AROM Comments UE AROM WNL    Cervical Flexion 20   pain   Cervical Extension 25   pain   Cervical - Right Rotation 50    Cervical - Left Rotation 2   pain     Strength   Right Shoulder Flexion 4+/5    Right Shoulder ABduction 4+/5    Left Shoulder Flexion 4+/5    Left Shoulder ABduction 4+/5    Right Elbow Flexion 5/5    Right Elbow Extension 5/5    Left Elbow Flexion 5/5    Left Elbow Extension 5/5    Right Hand Grip (lbs) 45    Left Hand Grip (lbs) 30      Palpation   Palpation comment TTP to L levator, L upper trap, C7 spinous process                        Objective measurements completed on examination: See above findings.       [facility] Adult PT Treatment/Exercise - 02/16/21 0001       Exercises   Exercises Neck      Neck Exercises: Theraband   Rows 10 reps;Red      Neck Exercises: Stretches   Upper Trapezius Stretch 30 seconds;Left    Levator Stretch 30 seconds;Left    Other Neck Stretches Rotation SNAG L x 10 - 5 sec hold                      PT Short Term Goals - 02/16/21 1610       PT SHORT TERM GOAL #1   Title Pt will be compliant and knowledgeable with 90% of HEP in order to decrease pain and improve carryover    Baseline initial HEP given    Time 3    Period Weeks    Status New    Target Date 03/09/21      PT SHORT TERM GOAL #2   Title Pt will self report L sided neck pain no greater than 6/10 at worst in order to improve comfort and function    Baseline 10/10 at worst    Time 3    Period Weeks    Status New  Target Date 03/09/21               PT Long Term Goals - 02/16/21 0854       PT LONG TERM GOAL #1   Title Pt will improve L grip strength to no less than 40lbs in order to improve ability with ADLs    Baseline 30lbs    Time 8    Period Weeks    Status New    Target Date 04/13/21      PT LONG TERM GOAL #2   Title Pt will improve L cervical rotation AROM to no less than 40  degrees in order to improve comfort and functional ability    Baseline 23 degrees    Time 8    Period Weeks    Status New    Target Date 04/13/21      PT LONG TERM GOAL #3   Title Pt will self report L sided neck pain no greater than 3/10 at worst in order to improve comfort    Baseline 10/10 at worst    Time 8    Period Weeks    Status New    Target Date 04/13/21                    Plan - 02/16/21 0836     Clinical Impression Statement Pt is a pleasant 80 y/o F who presents to PT with reports of L sided neck discomfort made worse after recent fall. Physical findings are consistent with MD impression, as pt had palpable tenderness in L sided cervical and persicapular muscles as well as general decrease in cervical AROM. While her FOTO score showed opitmal functioning, she would benefit from skilled PT working to improve neck AROM and decreasing pain in order to improve comfort while caring for her spouse. PT will assess response to initial HEP and progress as able.    Personal Factors and Comorbidities Age;Comorbidity 2    Comorbidities HTN; Lupus    Examination-Activity Limitations Lift;Reach Overhead;Caring for Others;Carry    Examination-Participation Restrictions Cleaning;Community Activity;Driving;Yard Work    Stability/Clinical Decision Making Stable/Uncomplicated    Optometrist Low    Rehab Potential Excellent    PT Frequency 1x / week    PT Duration 8 weeks    PT Treatment/Interventions ADLs/Self Care Home Management;Moist Heat;Traction;Gait training;Stair training;Functional mobility training;Therapeutic activities;Therapeutic exercise;Balance training;Neuromuscular re-education;Patient/family education;Manual techniques;Dry needling;Vasopneumatic Device    PT Next Visit Plan assess response to HEP; manual therapy for decreasing L sided neck pain; progress as able    PT Home Exercise Plan Access Code: [email or web address]    Consulted and Agree with Plan of Care  Patient             Patient will benefit from skilled therapeutic intervention in order to improve the following deficits and impairments:  Decreased activity tolerance, Decreased mobility, Decreased range of motion, Decreased strength, Impaired UE functional use, Pain  Visit Diagnosis: Cervicalgia - Plan: PT plan of care cert/re-cert  Muscle weakness (generalized) - Plan: PT plan of care cert/re-cert     Problem List Patient Active Problem List   Diagnosis Date Noted   Dyspnea 04/11/2019   Belching 04/11/2019   Constipation 04/11/2019   Essential hypertension 11/02/2018   Lupus (HCC) 11/02/2018   Primary osteoarthritis of both knees 06/06/2018   Cough variant asthma 08/25/2017   Systemic lupus erythematosus (HCC) 02/18/2017   High risk medication use 02/18/2017   Raynaud's disease without gangrene 02/18/2017  DDD (degenerative disc disease), lumbar 02/18/2017   Primary osteoarthritis of both hands 02/18/2017   Primary osteoarthritis of both feet 02/18/2017   Vitamin D deficiency 02/18/2017   History of hypertension 02/18/2017   History of neuropathy 02/18/2017    Eloy Endavid C Alexza Norbeck, PT, DPT 02/16/21 9:05 AM  [facility] [address] [place], [place], [address] Phone: [phone]   Fax:  [phone]  Name: Selinda MichaelsGloria D Marland MRN: [id number] Date of Birth: 1941/06/19  Referring diagnosis? M43.6 (ICD-10-CM) - Neck stiffness Treatment diagnosis? (if different than referring diagnosis)  Cervicalgia - Plan: PT plan of care cert/re-cert Muscle weakness (generalized) - Plan: PT plan of care cert/re-cert What was this (referring dx) caused by? []  Surgery [x]  Fall [x]  Ongoing issue []  Arthritis []  Other: ____________  Laterality: []  Rt [x]  Lt []  Both  Check all possible CPT codes:      [x]  97110 (Therapeutic Exercise)  []  92507 (SLP Treatment)  [x]  97112 (Neuro Re-ed)   []  92526 (Swallowing Treatment)   [x]   [id number] (Gait Training)   []  [id number] (Cognitive Training, 1st 15 minutes) [x]  97140 (Manual Therapy)   []  97130 (Cognitive Training, each add'l 15 minutes)  [x]  97530 (Therapeutic Activities)  []  Other, List CPT Code ____________    [x]  97535 (Self Care)       []  All codes above (97110 - 97535)  []  97012 (Mechanical Traction)  []  97014 (E-stim Unattended)  []  97032 (E-stim manual)  []  97033 (Ionto)  []  97035 (Ultrasound)  []  97760 (Orthotic Fit) []  [id number] (Physical Performance Training) []  [id number] (Aquatic Therapy) []  97034 (Contrast Bath) []  [id number] (Paraffin) []  97597 (Wound Care 1st 20 sq cm) []  [id number] (Wound Care each add'l 20 sq cm) []  97016 (Vasopneumatic Device) []  [id number] Public affairs consultant(Orthotic Training) []  [id number] (Prosthetic Training)

## 2021-02-18 ENCOUNTER — Ambulatory Visit (INDEPENDENT_AMBULATORY_CARE_PROVIDER_SITE_OTHER): Payer: Medicare PPO

## 2021-02-18 VITALS — Ht 64.5 in | Wt 174.0 lb

## 2021-02-18 DIAGNOSIS — Z0000 Encounter for general adult medical examination without abnormal findings: Secondary | ICD-10-CM

## 2021-02-18 NOTE — Patient Instructions (Signed)
Jessica Booker , Thank you for taking time to come for your Medicare Wellness Visit. I appreciate your ongoing commitment to your health goals. Please review the following plan we discussed and let me know if I can assist you in the future.   Screening recommendations/referrals: Colonoscopy: not required Mammogram: completed 04/17/2020 Bone Density: completed 08/25/2018 Recommended yearly ophthalmology/optometry visit for glaucoma screening and checkup Recommended yearly dental visit for hygiene and checkup  Vaccinations: Influenza vaccine: completed 05/13/2020, due 03/30/2021 Pneumococcal vaccine: completed 07/06/2019 Tdap vaccine: completed 01/11/2012, due 01/10/2022 Shingles vaccine: completed   Covid-19: 12/25/2020, 05/23/2020, 10/30/2019, 10/04/2019  Advanced directives: Please bring a copy of your POA (Power of Attorney) and/or Living Will to your next appointment.   Conditions/risks identified: none  Next appointment: Follow up in one year for your annual wellness visit    Preventive Care 65 Years and Older, Female Preventive care refers to lifestyle choices and visits with your health care provider that can promote health and wellness. What does preventive care include? A yearly physical exam. This is also called an annual well check. Dental exams once or twice a year. Routine eye exams. Ask your health care provider how often you should have your eyes checked. Personal lifestyle choices, including: Daily care of your teeth and gums. Regular physical activity. Eating a healthy diet. Avoiding tobacco and drug use. Limiting alcohol use. Practicing safe sex. Taking low-dose aspirin every day. Taking vitamin and mineral supplements as recommended by your health care provider. What happens during an annual well check? The services and screenings done by your health care provider during your annual well check will depend on your age, overall health, lifestyle risk factors, and family  history of disease. Counseling  Your health care provider may ask you questions about your: Alcohol use. Tobacco use. Drug use. Emotional well-being. Home and relationship well-being. Sexual activity. Eating habits. History of falls. Memory and ability to understand (cognition). Work and work Astronomer. Reproductive health. Screening  You may have the following tests or measurements: Height, weight, and BMI. Blood pressure. Lipid and cholesterol levels. These may be checked every 5 years, or more frequently if you are over 29 years old. Skin check. Lung cancer screening. You may have this screening every year starting at age 43 if you have a 30-pack-year history of smoking and currently smoke or have quit within the past 15 years. Fecal occult blood test (FOBT) of the stool. You may have this test every year starting at age 63. Flexible sigmoidoscopy or colonoscopy. You may have a sigmoidoscopy every 5 years or a colonoscopy every 10 years starting at age 24. Hepatitis C blood test. Hepatitis B blood test. Sexually transmitted disease (STD) testing. Diabetes screening. This is done by checking your blood sugar (glucose) after you have not eaten for a while (fasting). You may have this done every 1-3 years. Bone density scan. This is done to screen for osteoporosis. You may have this done starting at age 31. Mammogram. This may be done every 1-2 years. Talk to your health care provider about how often you should have regular mammograms. Talk with your health care provider about your test results, treatment options, and if necessary, the need for more tests. Vaccines  Your health care provider may recommend certain vaccines, such as: Influenza vaccine. This is recommended every year. Tetanus, diphtheria, and acellular pertussis (Tdap, Td) vaccine. You may need a Td booster every 10 years. Zoster vaccine. You may need this after age 40. Pneumococcal 13-valent conjugate (PCV13)  vaccine. One dose is recommended after age 24. Pneumococcal polysaccharide (PPSV23) vaccine. One dose is recommended after age 53. Talk to your health care provider about which screenings and vaccines you need and how often you need them. This information is not intended to replace advice given to you by your health care provider. Make sure you discuss any questions you have with your health care provider. Document Released: 09/12/2015 Document Revised: 05/05/2016 Document Reviewed: 06/17/2015 Elsevier Interactive Patient Education  2017 [place] Prevention in the Home Falls can cause injuries. They can happen to people of all ages. There are many things you can do to make your home safe and to help prevent falls. What can I do on the outside of my home? Regularly fix the edges of walkways and driveways and fix any cracks. Remove anything that might make you trip as you walk through a door, such as a raised step or threshold. Trim any bushes or trees on the path to your home. Use bright outdoor lighting. Clear any walking paths of anything that might make someone trip, such as rocks or tools. Regularly check to see if handrails are loose or broken. Make sure that both sides of any steps have handrails. Any raised decks and porches should have guardrails on the edges. Have any leaves, snow, or ice cleared regularly. Use sand or salt on walking paths during winter. Clean up any spills in your garage right away. This includes oil or grease spills. What can I do in the bathroom? Use night lights. Install grab bars by the toilet and in the tub and shower. Do not use towel bars as grab bars. Use non-skid mats or decals in the tub or shower. If you need to sit down in the shower, use a plastic, non-slip stool. Keep the floor dry. Clean up any water that spills on the floor as soon as it happens. Remove soap buildup in the tub or shower regularly. Attach bath mats securely with  double-sided non-slip rug tape. Do not have throw rugs and other things on the floor that can make you trip. What can I do in the bedroom? Use night lights. Make sure that you have a light by your bed that is easy to reach. Do not use any sheets or blankets that are too big for your bed. They should not hang down onto the floor. Have a firm chair that has side arms. You can use this for support while you get dressed. Do not have throw rugs and other things on the floor that can make you trip. What can I do in the kitchen? Clean up any spills right away. Avoid walking on wet floors. Keep items that you use a lot in easy-to-reach places. If you need to reach something above you, use a strong step stool that has a grab bar. Keep electrical cords out of the way. Do not use floor polish or wax that makes floors slippery. If you must use wax, use non-skid floor wax. Do not have throw rugs and other things on the floor that can make you trip. What can I do with my stairs? Do not leave any items on the stairs. Make sure that there are handrails on both sides of the stairs and use them. Fix handrails that are broken or loose. Make sure that handrails are as long as the stairways. Check any carpeting to make sure that it is firmly attached to the stairs. Fix any carpet that is loose or  worn. Avoid having throw rugs at the top or bottom of the stairs. If you do have throw rugs, attach them to the floor with carpet tape. Make sure that you have a light switch at the top of the stairs and the bottom of the stairs. If you do not have them, ask someone to add them for you. What else can I do to help prevent falls? Wear shoes that: Do not have high heels. Have rubber bottoms. Are comfortable and fit you well. Are closed at the toe. Do not wear sandals. If you use a stepladder: Make sure that it is fully opened. Do not climb a closed stepladder. Make sure that both sides of the stepladder are locked  into place. Ask someone to hold it for you, if possible. Clearly mark and make sure that you can see: Any grab bars or handrails. First and last steps. Where the edge of each step is. Use tools that help you move around (mobility aids) if they are needed. These include: Canes. Walkers. Scooters. Crutches. Turn on the lights when you go into a dark area. Replace any light bulbs as soon as they burn out. Set up your furniture so you have a clear path. Avoid moving your furniture around. If any of your floors are uneven, fix them. If there are any pets around you, be aware of where they are. Review your medicines with your doctor. Some medicines can make you feel dizzy. This can increase your chance of falling. Ask your doctor what other things that you can do to help prevent falls. This information is not intended to replace advice given to you by your health care provider. Make sure you discuss any questions you have with your health care provider. Document Released: 06/12/2009 Document Revised: 01/22/2016 Document Reviewed: 09/20/2014 Elsevier Interactive Patient Education  2017 [employer].

## 2021-02-18 NOTE — Progress Notes (Signed)
I connected with Jessica Booker today by telephone and verified that I am speaking with the correct person using two identifiers. Location patient: home Location provider: work Persons participating in the virtual visit: Jessica Booker, Elisha Ponder LPN.   I discussed the limitations, risks, security and privacy concerns of performing an evaluation and management service by telephone and the availability of in person appointments. I also discussed with the patient that there may be a patient responsible charge related to this service. The patient expressed understanding and verbally consented to this telephonic visit.    Interactive audio and video telecommunications were attempted between this provider and patient, however failed, due to patient having technical difficulties OR patient did not have access to video capability.  We continued and completed visit with audio only.     Vital signs may be patient reported or missing.  Subjective:   Jessica Booker is a 80 y.o. female who presents for Medicare Annual (Subsequent) preventive examination.  Review of Systems     Cardiac Risk Factors include: advanced age (>99men, >28 women);hypertension     Objective:    Today's Vitals   02/18/21 0916  Weight: 174 lb (78.9 kg)  Height: 5' 4.5" (1.638 m)   Body mass index is 29.41 kg/m.  Advanced Directives 02/18/2021 02/16/2021 11/14/2019 11/02/2018 02/23/2016 08/04/2015  Does Patient Have a Medical Advance Directive? Yes Yes Yes Yes No No  Type of Estate agent of [place];Living will - Healthcare Power of [place];Living will Living will;Healthcare Power of Attorney - -  Does patient want to make changes to medical advance directive? - - - No - Patient declined - -  Copy of Healthcare Power of Attorney in Chart? No - copy requested - No - copy requested No - copy requested - -    Current Medications (verified) Outpatient Encounter Medications as of 02/18/2021   Medication Sig   aspirin 81 MG tablet Take 81 mg by mouth daily. Takes as needed   diclofenac sodium (VOLTAREN) 1 % GEL Apply 2 g to 4 g to affected joint up to 4 times daily PRN.   hydroxychloroquine (PLAQUENIL) 200 MG tablet TAKE 1 TABLET BY MOUTH TWICE DAILY MONDAY THROUGH FRIDAY ONLY.   linaclotide (LINZESS) 72 MCG capsule Take 1 capsule (72 mcg total) by mouth daily before breakfast.   loratadine (CLARITIN) 10 MG tablet TAKE 1 TABLET BY MOUTH EVERY DAY (Patient taking differently: as needed.)   NIFEdipine (PROCARDIA XL/NIFEDICAL XL) 60 MG 24 hr tablet TAKE 1 TABLET BY MOUTH EVERY DAY   valsartan-hydrochlorothiazide (DIOVAN-HCT) 320-12.5 MG tablet TAKE 1 TABLET BY MOUTH EVERY DAY IN THE MORNING   No facility-administered encounter medications on file as of 02/18/2021.    Allergies (verified) Patient has no known allergies.   History: Past Medical History:  Diagnosis Date   Asthma    Hypertension    Lupus (HCC)    Past Surgical History:  Procedure Laterality Date   ABDOMINAL HYSTERECTOMY     CHOLECYSTECTOMY     SHOULDER ARTHROSCOPY W/ ROTATOR CUFF REPAIR     R shoulder   TONSILLECTOMY     Family History  Problem Relation Age of Onset   Heart attack Mother    Stomach cancer Father    Hypertension Sister    Heart disease Sister    Social History   Socioeconomic History   Marital status: Married    Spouse name: Not on file   Number of children: 2   Years of education: Not  on file   Highest education level: Not on file  Occupational History   Occupation: retired  Tobacco Use   Smoking status: Never   Smokeless tobacco: Never  Vaping Use   Vaping Use: Never used  Substance and Sexual Activity   Alcohol use: No   Drug use: No   Sexual activity: Not Currently    Birth control/protection: Surgical  Other Topics Concern   Not on file  Social History Narrative   Not on file   Social Determinants of Health   Financial Resource Strain: Low Risk    Difficulty  of Paying Living Expenses: Not hard at all  Food Insecurity: No Food Insecurity   Worried About Programme researcher, broadcasting/film/videounning Out of Food in the Last Year: Never true   Ran Out of Food in the Last Year: Never true  Transportation Needs: No Transportation Needs   Lack of Transportation (Medical): No   Lack of Transportation (Non-Medical): No  Physical Activity: Sufficiently Active   Days of Exercise per Week: 6 days   Minutes of Exercise per Session: 60 min  Stress: No Stress Concern Present   Feeling of Stress : Not at all  Social Connections: Not on file    Tobacco Counseling Counseling given: Not Answered   Clinical Intake:  Pre-visit preparation completed: Yes  Pain : No/denies pain     Nutritional Status: BMI 25 -29 Overweight Nutritional Risks: None Diabetes: No  How often do you need to have someone help you when you read instructions, pamphlets, or other written materials from your doctor or pharmacy?: 1 - Never What is the last grade level you completed in school?: college  Diabetic? no  Interpreter Needed?: No  Information entered by :: NAllen LPN   Activities of Daily Living In your present state of health, do you have any difficulty performing the following activities: 02/18/2021 11/20/2020  Hearing? N N  Vision? N N  Difficulty concentrating or making decisions? N N  Walking or climbing stairs? N N  Dressing or bathing? N N  Doing errands, shopping? N N  Preparing Food and eating ? N -  Using the Toilet? N -  In the past six months, have you accidently leaked urine? N -  Do you have problems with loss of bowel control? N -  Managing your Medications? N -  Managing your Finances? N -  Housekeeping or managing your Housekeeping? N -  Some recent data might be hidden    Patient Care Team: Dorothyann PengSanders, Robyn, MD as PCP - General (Internal Medicine) Pollyann Savoyeveshwar, Shaili, MD as Consulting Physician (Rheumatology) Antony ContrasLyles, Graham, MD as Consulting Physician (Ophthalmology) Clarene DukeLittle,  Karma LewAngel L, RN as [facility] Care Management Bevelyn NgoHumble, Kendra as [facility] Care Management  Indicate any recent Medical Services you may have received from other than [facility] providers in the past year (date may be approximate).     Assessment:   This is a routine wellness examination for Jessica Booker.  Hearing/Vision screen No results found.  Dietary issues and exercise activities discussed: Current Exercise Habits: Home exercise routine, Type of exercise: walking, Time (Minutes): 60, Frequency (Times/Week): 6, Weekly Exercise (Minutes/Week): 360   Goals Addressed             This Visit's Progress    Patient Stated       02/18/2021, wants to weigh 150 pounds        Depression Screen PHQ 2/9 Scores 02/18/2021 11/20/2020 11/14/2019 05/23/2019 04/11/2019 11/02/2018 06/06/2018  PHQ - 2 Score  0 0 0 0 0 0 0  PHQ- 9 Score - - 1 - - 0 -    Fall Risk Fall Risk  02/18/2021 11/20/2020 11/14/2019 05/23/2019 04/11/2019  Falls in the past year? 1 0 0 0 0  Comment shoe got caught up - - - -  Number falls in past yr: 0 - - - -  Injury with Fall? 0 - - - -  Risk for fall due to : Medication side effect - Medication side effect - -  Follow up Falls evaluation completed;Education provided;Falls prevention discussed - Falls evaluation completed;Education provided;Falls prevention discussed - -    FALL RISK PREVENTION PERTAINING TO THE HOME:  Any stairs in or around the home? Yes  If so, are there any without handrails? No  Home free of loose throw rugs in walkways, pet beds, electrical cords, etc? Yes  Adequate lighting in your home to reduce risk of falls? Yes   ASSISTIVE DEVICES UTILIZED TO PREVENT FALLS:  Life alert? No  Use of a cane, walker or w/c? No  Grab bars in the bathroom? Yes  Shower chair or bench in shower? Yes  Elevated toilet seat or a handicapped toilet? No   TIMED UP AND GO:  Was the test performed? No .      Cognitive Function:     6CIT Screen  02/18/2021 11/14/2019 11/02/2018  What Year? 0 points 0 points 0 points  What month? 0 points 0 points 0 points  What time? 0 points 0 points 0 points  Count back from 20 0 points 0 points 0 points  Months in reverse 0 points 0 points 0 points  Repeat phrase 4 points 2 points 2 points  Total Score 4 2 2     Immunizations Immunization History  Administered Date(s) Administered   Influenza, High Dose Seasonal PF 06/21/2017, 05/29/2018, 05/16/2019   Influenza-Unspecified 05/30/2018, 05/16/2019, 05/13/2020   PFIZER Comirnaty(Gray Top)Covid-19 Tri-Sucrose Vaccine 12/25/2020   PFIZER(Purple Top)SARS-COV-2 Vaccination 10/04/2019, 10/30/2019, 05/23/2020   Pneumococcal Conjugate-13 07/06/2019   Pneumococcal Polysaccharide-23 09/17/2013   Zoster Recombinat (Shingrix) 05/29/2018, 08/01/2018   Zoster, Live 10/17/2020    TDAP status: Up to date  Flu Vaccine status: Up to date  Pneumococcal vaccine status: Up to date  Covid-19 vaccine status: Completed vaccines  Qualifies for Shingles Vaccine? Yes   Zostavax completed Yes   Shingrix Completed?: Yes  Screening Tests Health Maintenance  Topic Date Due   INFLUENZA VACCINE  03/30/2021   COVID-19 Vaccine (5 - Booster for Pfizer series) 04/26/2021   TETANUS/TDAP  01/10/2022   DEXA SCAN  Completed   Hepatitis C Screening  Completed   PNA vac Low Risk Adult  Completed   Zoster Vaccines- Shingrix  Completed   HPV VACCINES  Aged Out    Health Maintenance  There are no preventive care reminders to display for this patient.  Colorectal cancer screening: No longer required.   Mammogram status: Completed 04/17/2020. Repeat every year  Bone Density status: Completed 08/25/2018.   Lung Cancer Screening: (Low Dose CT Chest recommended if Age 59-80 years, 30 pack-year currently smoking OR have quit w/in 15years.) does not qualify.   Lung Cancer Screening Referral: no  Additional Screening:  Hepatitis C Screening: does qualify; Completed  04/21/2020  Vision Screening: Recommended annual ophthalmology exams for early detection of glaucoma and other disorders of the eye. Is the patient up to date with their annual eye exam?  Yes  Who is the provider or what is the name of  the office in which the patient attends annual eye exams? Dr. Randon Goldsmith If pt is not established with a provider, would they like to be referred to a provider to establish care? No .   Dental Screening: Recommended annual dental exams for proper oral hygiene  Community Resource Referral / Chronic Care Management: CRR required this visit?  No   CCM required this visit?  No      Plan:     I have personally reviewed and noted the following in the patient's chart:   Medical and social history Use of alcohol, tobacco or illicit drugs  Current medications and supplements including opioid prescriptions.  Functional ability and status Nutritional status Physical activity Advanced directives List of other physicians Hospitalizations, surgeries, and ER visits in previous 12 months Vitals Screenings to include cognitive, depression, and falls Referrals and appointments  In addition, I have reviewed and discussed with patient certain preventive protocols, quality metrics, and best practice recommendations. A written personalized care plan for preventive services as well as general preventive health recommendations were provided to patient.     Barb Merino, LPN   9/92/4268   Nurse Notes:

## 2021-02-20 ENCOUNTER — Other Ambulatory Visit: Payer: Self-pay | Admitting: Cardiology

## 2021-02-20 DIAGNOSIS — I340 Nonrheumatic mitral (valve) insufficiency: Secondary | ICD-10-CM

## 2021-02-20 DIAGNOSIS — I10 Essential (primary) hypertension: Secondary | ICD-10-CM

## 2021-02-23 ENCOUNTER — Other Ambulatory Visit: Payer: Self-pay | Admitting: Internal Medicine

## 2021-02-23 ENCOUNTER — Other Ambulatory Visit: Payer: Self-pay

## 2021-02-23 ENCOUNTER — Ambulatory Visit: Payer: Medicare PPO

## 2021-02-23 DIAGNOSIS — M6281 Muscle weakness (generalized): Secondary | ICD-10-CM

## 2021-02-23 DIAGNOSIS — M542 Cervicalgia: Secondary | ICD-10-CM

## 2021-02-23 NOTE — Therapy (Addendum)
[facility] [address] [place], [place], [address] Phone: [phone]   Fax:  [phone]  Physical Therapy Treatment/Discharge  Patient Details  Name: Jessica Booker MRN: [id number] Date of Birth: October 19, 1940 Referring Provider (PT): Pollyann Savoy, MD   Encounter Date: 02/23/2021   PT End of Session - 02/23/21 0749     Visit Number 2    Number of Visits 9    Date for PT Re-Evaluation 04/13/21    Authorization Type [employer] - FOTO 6th and 10th    Authorization Time Period 02/23/21 - 04/18/21    Authorization - Visit Number 1    Authorization - Number of Visits 8    Progress Note Due on Visit 10    PT Start Time 0747    PT Stop Time 0837   10 min of moist heat post session   PT Time Calculation (min) 50 min    Activity Tolerance Patient tolerated treatment well    Behavior During Therapy [facility] for tasks assessed/performed             Past Medical History:  Diagnosis Date   Asthma    Hypertension    Lupus (HCC)     Past Surgical History:  Procedure Laterality Date   ABDOMINAL HYSTERECTOMY     CHOLECYSTECTOMY     SHOULDER ARTHROSCOPY W/ ROTATOR CUFF REPAIR     R shoulder   TONSILLECTOMY      There were no vitals filed for this visit.   Subjective Assessment - 02/23/21 0750     Subjective Pt presents to PT with reports of continued L sided neck pain. She has been compliant with HEP, but notes some difficulty with SNAG exercise. Pt is ready to begin PT at this time.    Currently in Pain? Yes    Pain Score 8     Pain Location Neck    Pain Orientation Left                               [facility] Adult PT Treatment/Exercise - 02/23/21 0001       Exercises   Exercises Shoulder      Neck Exercises: Machines for Strengthening   Nustep lvl 5 UE/LE x 4 min while taking subjective      Neck Exercises: Supine   Neck Retraction 10 reps;3 secs    Neck Retraction Limitations x 2       Shoulder Exercises: Standing   Extension 10 reps    Theraband Level (Shoulder Extension) Level 3 (Green)    Extension Limitations x 2    Row 12 reps;Theraband    Theraband Level (Shoulder Row) Level 3 (Green)    Row Limitations x 2      Modalities   Modalities Moist Heat      Moist Heat Therapy   Number Minutes Moist Heat 10 Minutes    Moist Heat Location Cervical      Manual Therapy   Manual therapy comments STM to L upper trap, positional release to L upper trap; suboccipital release      Neck Exercises: Stretches   Other Neck Stretches Rotation SNAG L x 10 - 5 sec hold                      PT Short Term Goals - 02/16/21 0852       PT SHORT TERM GOAL #1  Title Pt will be compliant and knowledgeable with 90% of HEP in order to decrease pain and improve carryover    Baseline initial HEP given    Time 3    Period Weeks    Status New    Target Date 03/09/21      PT SHORT TERM GOAL #2   Title Pt will self report L sided neck pain no greater than 6/10 at worst in order to improve comfort and function    Baseline 10/10 at worst    Time 3    Period Weeks    Status New    Target Date 03/09/21               PT Long Term Goals - 02/16/21 0854       PT LONG TERM GOAL #1   Title Pt will improve L grip strength to no less than 40lbs in order to improve ability with ADLs    Baseline 30lbs    Time 8    Period Weeks    Status New    Target Date 04/13/21      PT LONG TERM GOAL #2   Title Pt will improve L cervical rotation AROM to no less than 40 degrees in order to improve comfort and functional ability    Baseline 23 degrees    Time 8    Period Weeks    Status New    Target Date 04/13/21      PT LONG TERM GOAL #3   Title Pt will self report L sided neck pain no greater than 3/10 at worst in order to improve comfort    Baseline 10/10 at worst    Time 8    Period Weeks    Status New    Target Date 04/13/21                   Plan -  02/23/21 0805     Clinical Impression Statement Pt was able to complete prescribed exercises with no adverse effect. She responded well to manual therapy interventions, noting slight decrease in discomfort. Pt continues to benefit from skilled PT services and will continue to be seen and progressed per POC.    PT Treatment/Interventions ADLs/Self Care Home Management;Moist Heat;Traction;Gait training;Stair training;Functional mobility training;Therapeutic activities;Therapeutic exercise;Balance training;Neuromuscular re-education;Patient/family education;Manual techniques;Dry needling;Vasopneumatic Device    PT Next Visit Plan manual therapy for decreasing L sided neck pain; progress as able    PT Home Exercise Plan Access Code: [email or web address]    Consulted and Agree with Plan of Care Patient             Patient will benefit from skilled therapeutic intervention in order to improve the following deficits and impairments:  Decreased activity tolerance, Decreased mobility, Decreased range of motion, Decreased strength, Impaired UE functional use, Pain  Visit Diagnosis: Cervicalgia  Muscle weakness (generalized)     Problem List Patient Active Problem List   Diagnosis Date Noted   Dyspnea 04/11/2019   Belching 04/11/2019   Constipation 04/11/2019   Essential hypertension 11/02/2018   Lupus (HCC) 11/02/2018   Primary osteoarthritis of both knees 06/06/2018   Cough variant asthma 08/25/2017   Systemic lupus erythematosus (HCC) 02/18/2017   High risk medication use 02/18/2017   Raynaud's disease without gangrene 02/18/2017   DDD (degenerative disc disease), lumbar 02/18/2017   Primary osteoarthritis of both hands 02/18/2017   Primary osteoarthritis of both feet 02/18/2017   Vitamin D deficiency 02/18/2017   History of  hypertension 02/18/2017   History of neuropathy 02/18/2017    Eloy End, PT, DPT 02/23/21 8:44 AM  [facility] [address] [place], [place], [address] Phone: [phone]   Fax:  [phone]  Name: Jessica Booker MRN: [id number] Date of Birth: Jul 22, 1941  PHYSICAL THERAPY DISCHARGE SUMMARY  Visits from Start of Care: 2  Current functional level related to goals / functional outcomes: Unable to assess   Remaining deficits: Unable to assess   Education / Equipment: Unable to assess   Patient agrees to discharge. Patient goals were  Unable to assess . Patient is being discharged due to not returning since the last visit.

## 2021-02-24 ENCOUNTER — Other Ambulatory Visit: Payer: Self-pay

## 2021-02-24 DIAGNOSIS — M3219 Other organ or system involvement in systemic lupus erythematosus: Secondary | ICD-10-CM

## 2021-02-24 MED ORDER — HYDROXYCHLOROQUINE SULFATE 200 MG PO TABS: ORAL_TABLET | ORAL | 0 refills | Status: DC

## 2021-02-24 NOTE — Telephone Encounter (Signed)
Patient called and requested a refill of plaquenil to be sent to [facility] on [address].   Last Visit: 01/19/2021 Next Visit: 05/25/2021 Labs: 11/20/2020 CBC: MCH 25.9 CMP WNL  01/19/2021 Complements are normal, double-stranded DNA is negative, sed rate is normal, RNP is negative, Smith negative, Ro antibody which is associated with Sjogren's is positive.  We can check urine protein creatinine ratio at the follow-upvisit.  No change in treatment advised.  Labs do not indicate an active disease. Eye exam: 01/12/2021   Current Dose per office note on 01/19/2021: PLQ 200 mg 1 tablet by mouth twice daily M-F. FH:[id number] systemic lupus erythematosus with other organ involvement  Last Fill: 11/17/2020  Okay to refill Plaquenil?

## 2021-03-04 ENCOUNTER — Ambulatory Visit: Payer: Medicare PPO

## 2021-03-09 ENCOUNTER — Telehealth: Payer: Self-pay

## 2021-03-09 ENCOUNTER — Ambulatory Visit: Payer: Medicare PPO | Attending: Rheumatology

## 2021-03-09 NOTE — Telephone Encounter (Signed)
PT called regarding missed, with no answer and voicemail full.  Eloy End, PT, DPT 03/09/21 10:39 AM

## 2021-03-12 ENCOUNTER — Telehealth: Payer: Medicare PPO

## 2021-03-12 ENCOUNTER — Telehealth: Payer: Self-pay

## 2021-03-12 NOTE — Telephone Encounter (Signed)
  Care Management   Follow Up Note   03/12/2021 Name: Jessica Booker MRN: [id number] DOB: 28-Dec-1940   Referred by: Dorothyann Peng, MD Reason for referral : Chronic Care Management (Initial RN CM Outreach - 2nd attempt )   A second unsuccessful telephone outreach was attempted today. The patient was referred to the case management team for assistance with care management and care coordination.   Follow Up Plan: Telephone follow up appointment with care management team member scheduled for: 04/09/21  Delsa Sale, RN, BSN, CCM Care Management Coordinator [facility] Care Management/[facility]  Direct Phone: [phone]

## 2021-03-16 ENCOUNTER — Ambulatory Visit: Payer: Medicare PPO

## 2021-03-25 ENCOUNTER — Other Ambulatory Visit: Payer: Self-pay | Admitting: Internal Medicine

## 2021-03-25 DIAGNOSIS — Z1231 Encounter for screening mammogram for malignant neoplasm of breast: Secondary | ICD-10-CM

## 2021-04-03 ENCOUNTER — Ambulatory Visit (INDEPENDENT_AMBULATORY_CARE_PROVIDER_SITE_OTHER): Payer: Medicare PPO

## 2021-04-03 DIAGNOSIS — M329 Systemic lupus erythematosus, unspecified: Secondary | ICD-10-CM

## 2021-04-03 DIAGNOSIS — I10 Essential (primary) hypertension: Secondary | ICD-10-CM | POA: Diagnosis not present

## 2021-04-03 NOTE — Chronic Care Management (AMB) (Signed)
Chronic Care Management    Social Work Note  04/03/2021 Name: Jessica Booker MRN: [id number] DOB: February 19, 1941  Jessica Booker is a 80 y.o. year old female who is a primary care patient of Dorothyann Peng, MD. The CCM team was consulted to assist the patient with chronic disease management and/or care coordination needs related to:  HTN and Lupus .   Engaged with patient by telephone for follow up visit in response to provider referral for social work chronic care management and care coordination services.   Consent to Services:  The patient was given information about Chronic Care Management services, agreed to services, and gave verbal consent prior to initiation of services.  Please see initial visit note for detailed documentation.   Patient agreed to services and consent obtained.   Assessment: Review of patient past medical history, allergies, medications, and health status, including review of relevant consultants reports was performed today as part of a comprehensive evaluation and provision of chronic care management and care coordination services.     SDOH (Social Determinants of Health) assessments and interventions performed:    Advanced Directives Status: Not addressed in this encounter.  CCM Care Plan  No Known Allergies  Outpatient Encounter Medications as of 04/03/2021  Medication Sig   aspirin 81 MG tablet Take 81 mg by mouth daily. Takes as needed   diclofenac sodium (VOLTAREN) 1 % GEL Apply 2 g to 4 g to affected joint up to 4 times daily PRN.   hydroxychloroquine (PLAQUENIL) 200 MG tablet TAKE 1 TABLET BY MOUTH TWICE DAILY MONDAY THROUGH FRIDAY ONLY.   linaclotide (LINZESS) 72 MCG capsule Take 1 capsule (72 mcg total) by mouth daily before breakfast.   loratadine (CLARITIN) 10 MG tablet TAKE 1 TABLET BY MOUTH EVERY DAY (Patient taking differently: as needed.)   NIFEdipine (PROCARDIA XL/NIFEDICAL XL) 60 MG 24 hr tablet TAKE 1 TABLET BY MOUTH EVERY DAY    valsartan-hydrochlorothiazide (DIOVAN-HCT) 320-12.5 MG tablet TAKE 1 TABLET BY MOUTH EVERY DAY IN THE MORNING   No facility-administered encounter medications on file as of 04/03/2021.    Patient Active Problem List   Diagnosis Date Noted   Dyspnea 04/11/2019   Belching 04/11/2019   Constipation 04/11/2019   Essential hypertension 11/02/2018   Lupus (HCC) 11/02/2018   Primary osteoarthritis of both knees 06/06/2018   Cough variant asthma 08/25/2017   Systemic lupus erythematosus (HCC) 02/18/2017   High risk medication use 02/18/2017   Raynaud's disease without gangrene 02/18/2017   DDD (degenerative disc disease), lumbar 02/18/2017   Primary osteoarthritis of both hands 02/18/2017   Primary osteoarthritis of both feet 02/18/2017   Vitamin D deficiency 02/18/2017   History of hypertension 02/18/2017   History of neuropathy 02/18/2017    Conditions to be addressed/monitored: HTN and Lupus  Care Plan : Social Work [facility] Care Plan  Updates made by Bevelyn Ngo since 04/03/2021 12:00 AM  Completed 04/03/2021   Problem: Quality of Life (General Plan of Care) Resolved 04/03/2021     Long-Range Goal: Quality of Life Maintained Completed 04/03/2021  Start Date: 01/01/2021  Expected End Date: 05/01/2021  Recent Progress: On track  Priority: Low  Note:   Current Barriers:  Chronic disease management support and education needs related to HTN and Lupus    Social Worker Clinical Goal(s):  Over the next 45 days the patient will engage with RN Care Manager to establish an individualized plan of care  Over the next 120 days the patient will work with SW  to identify and address any acute and/or chronic care coordination needs related to the self health management of HTN and Lupus    SW Interventions:  Inter-disciplinary care team collaboration (see longitudinal plan of care) Collaboration with Dorothyann Peng, MD regarding development and update of comprehensive plan of care as evidenced by  provider attestation and co-signature Successful outbound call placed to the patient to assess for care coordination needs Discussed the patient continues to do well in the home with no resource needs Discussed plans for SW to close current goal Encouraged the patient to contact SW as needed  Patient Goals/Self-Care Activities patient will:   - Contact SW as needed         Follow Up Plan:  No SW follow up planned at this time      Bevelyn Ngo, BSW, CDP Social Worker, Certified Dementia Practitioner [facility] Care Management [phone]

## 2021-04-03 NOTE — Patient Instructions (Signed)
Social Worker Visit Information  Goals we discussed today:   Goals Addressed             This Visit's Progress    COMPLETED: Quality of life maintained       Timeframe:  Long-Range Goal Priority:  Low Start Date:  5.5.22                           Expected End Date: 9.2.22                        Patient Goals/Self-Care Activities patient will:   - Contact SW as needed          Follow Up Plan:  No follow up planned at this time. Please contact me as needed   Bevelyn Ngo, BSW, CDP Social Worker, Certified Dementia Practitioner [facility] Care Management [phone]

## 2021-04-09 ENCOUNTER — Telehealth: Payer: Medicare PPO

## 2021-04-09 ENCOUNTER — Ambulatory Visit: Payer: Self-pay

## 2021-04-09 DIAGNOSIS — I10 Essential (primary) hypertension: Secondary | ICD-10-CM

## 2021-04-09 DIAGNOSIS — F5101 Primary insomnia: Secondary | ICD-10-CM

## 2021-04-09 DIAGNOSIS — M329 Systemic lupus erythematosus, unspecified: Secondary | ICD-10-CM

## 2021-04-09 NOTE — Chronic Care Management (AMB) (Signed)
  Care Management   Follow Up Note   04/09/2021 Name: Jessica Booker MRN: [id number] DOB: 1941-05-17   Referred by: Dorothyann Peng, MD Reason for referral : No chief complaint on file.   Third unsuccessful telephone outreach was attempted today. The patient was referred to the case management team for assistance with care management and care coordination. The patient's primary care provider has been notified of our unsuccessful attempts to make or maintain contact with the patient. The care management team is pleased to engage with this patient at any time in the future should he/she be interested in assistance from the care management team.   Follow Up Plan: We have been unable to make contact with the patient for follow up. The care management team is available to follow up with the patient after provider conversation with the patient regarding recommendation for care management engagement and subsequent re-referral to the care management team.   Delsa Sale, RN, BSN, CCM Care Management Coordinator [facility] Care Management/[facility]  Direct Phone: [phone]

## 2021-05-01 DIAGNOSIS — I10 Essential (primary) hypertension: Secondary | ICD-10-CM | POA: Diagnosis not present

## 2021-05-01 DIAGNOSIS — Z791 Long term (current) use of non-steroidal anti-inflammatories (NSAID): Secondary | ICD-10-CM | POA: Diagnosis not present

## 2021-05-01 DIAGNOSIS — Z7982 Long term (current) use of aspirin: Secondary | ICD-10-CM | POA: Diagnosis not present

## 2021-05-01 DIAGNOSIS — M329 Systemic lupus erythematosus, unspecified: Secondary | ICD-10-CM | POA: Diagnosis not present

## 2021-05-01 DIAGNOSIS — M1990 Unspecified osteoarthritis, unspecified site: Secondary | ICD-10-CM | POA: Diagnosis not present

## 2021-05-01 DIAGNOSIS — G8929 Other chronic pain: Secondary | ICD-10-CM | POA: Diagnosis not present

## 2021-05-01 DIAGNOSIS — E669 Obesity, unspecified: Secondary | ICD-10-CM | POA: Diagnosis not present

## 2021-05-01 DIAGNOSIS — I739 Peripheral vascular disease, unspecified: Secondary | ICD-10-CM | POA: Diagnosis not present

## 2021-05-01 DIAGNOSIS — Z6830 Body mass index (BMI) 30.0-30.9, adult: Secondary | ICD-10-CM | POA: Diagnosis not present

## 2021-05-11 NOTE — Progress Notes (Deleted)
Office Visit Note  Patient: Jessica Booker             Date of Birth: August 05, 1941           MRN: [id number]             PCP: Dorothyann Peng, MD Referring: Dorothyann Peng, MD Visit Date: 05/25/2021 Occupation: @GUAROCC @  Subjective:  No chief complaint on file.   History of Present Illness: Jessica Booker is a 80 y.o. female ***   Activities of Daily Living:  Patient reports morning stiffness for *** {minute/hour:19697}.   Patient {ACTIONS;DENIES/REPORTS:21021675::"Denies"} nocturnal pain.  Difficulty dressing/grooming: {ACTIONS;DENIES/REPORTS:21021675::"Denies"} Difficulty climbing stairs: {ACTIONS;DENIES/REPORTS:21021675::"Denies"} Difficulty getting out of chair: {ACTIONS;DENIES/REPORTS:21021675::"Denies"} Difficulty using hands for taps, buttons, cutlery, and/or writing: {ACTIONS;DENIES/REPORTS:21021675::"Denies"}  No Rheumatology ROS completed.   PMFS History:  Patient Active Problem List   Diagnosis Date Noted   Dyspnea 04/11/2019   Belching 04/11/2019   Constipation 04/11/2019   Essential hypertension 11/02/2018   Lupus (HCC) 11/02/2018   Primary osteoarthritis of both knees 06/06/2018   Cough variant asthma 08/25/2017   Systemic lupus erythematosus (HCC) 02/18/2017   High risk medication use 02/18/2017   Raynaud's disease without gangrene 02/18/2017   DDD (degenerative disc disease), lumbar 02/18/2017   Primary osteoarthritis of both hands 02/18/2017   Primary osteoarthritis of both feet 02/18/2017   Vitamin D deficiency 02/18/2017   History of hypertension 02/18/2017   History of neuropathy 02/18/2017    Past Medical History:  Diagnosis Date   Asthma    Hypertension    Lupus (HCC)     Family History  Problem Relation Age of Onset   Heart attack Mother    Stomach cancer Father    Hypertension Sister    Heart disease Sister    Past Surgical History:  Procedure Laterality Date   ABDOMINAL HYSTERECTOMY     CHOLECYSTECTOMY     SHOULDER  ARTHROSCOPY W/ ROTATOR CUFF REPAIR     R shoulder   TONSILLECTOMY     Social History   Social History Narrative   Not on file   Immunization History  Administered Date(s) Administered   Influenza, High Dose Seasonal PF 06/21/2017, 05/29/2018, 05/16/2019   Influenza-Unspecified 05/30/2018, 05/16/2019, 05/13/2020   PFIZER Comirnaty(Gray Top)Covid-19 Tri-Sucrose Vaccine 12/25/2020   PFIZER(Purple Top)SARS-COV-2 Vaccination 10/04/2019, 10/30/2019, 05/23/2020   Pneumococcal Conjugate-13 07/06/2019   Pneumococcal Polysaccharide-23 09/17/2013   Zoster Recombinat (Shingrix) 05/29/2018, 08/01/2018   Zoster, Live 10/17/2020     Objective: Vital Signs: There were no vitals taken for this visit.   Physical Exam   Musculoskeletal Exam: ***  CDAI Exam: CDAI Score: -- Patient Global: --; Provider Global: -- Swollen: --; Tender: -- Joint Exam 05/25/2021   No joint exam has been documented for this visit   There is currently no information documented on the homunculus. Go to the Rheumatology activity and complete the homunculus joint exam.  Investigation: No additional findings.  Imaging: No results found.  Recent Labs: Lab Results  Component Value Date   WBC 7.2 11/20/2020   HGB 11.7 11/20/2020   PLT 262 11/20/2020   NA 138 11/20/2020   K 3.9 11/20/2020   CL 100 11/20/2020   CO2 24 11/20/2020   GLUCOSE 79 11/20/2020   BUN 18 11/20/2020   CREATININE 0.90 11/20/2020   BILITOT 0.4 11/20/2020   ALKPHOS 106 11/20/2020   AST 20 11/20/2020   ALT 10 11/20/2020   PROT 7.6 11/20/2020   ALBUMIN 4.4 11/20/2020   CALCIUM 9.5 11/20/2020  GFRAA 73 06/27/2020    Speciality Comments: PLQ Eye Exam: 01/12/2021 WNL @ [facility] Follow up in 1 year.  Procedures:  No procedures performed Allergies: Patient has no known allergies.   Assessment / Plan:     Visit Diagnoses: No diagnosis found.  Orders: No orders of the defined types were placed in this encounter.  No  orders of the defined types were placed in this encounter.   Face-to-face time spent with patient was *** minutes. Greater than 50% of time was spent in counseling and coordination of care.  Follow-Up Instructions: No follow-ups on file.   Ellen Henri, CMA  Note - This record has been created using Animal nutritionist.  Chart creation errors have been sought, but may not always  have been located. Such creation errors do not reflect on  the standard of medical care.

## 2021-05-14 ENCOUNTER — Ambulatory Visit: Payer: Medicare PPO

## 2021-05-17 ENCOUNTER — Other Ambulatory Visit: Payer: Self-pay | Admitting: Rheumatology

## 2021-05-17 DIAGNOSIS — M3219 Other organ or system involvement in systemic lupus erythematosus: Secondary | ICD-10-CM

## 2021-05-18 NOTE — Telephone Encounter (Signed)
Last Visit: 01/19/2021  Next Visit: 05/25/2021  Labs: 11/20/2020 CBC: MCH 25.9 CMP WNL  01/19/2021 Complements are normal, double-stranded DNA is negative, sed rate is normal, RNP is negative, Smith negative, Ro antibody which is associated with Sjogren's is positive.  We can check urine protein creatinine ratio at the follow-upvisit.  No change in treatment advised.  Labs do not indicate an active disease. Eye exam: 01/12/2021    Current Dose per office note on 01/19/2021: PLQ 200 mg 1 tablet by mouth twice daily M-F. DD:UKGUR systemic lupus erythematosus with other organ involvement   Last Fill: 02/24/2021  Patient to update labs at upcoming appointment.

## 2021-05-25 ENCOUNTER — Ambulatory Visit: Payer: Medicare PPO | Admitting: Physician Assistant

## 2021-05-25 DIAGNOSIS — Z8679 Personal history of other diseases of the circulatory system: Secondary | ICD-10-CM

## 2021-05-25 DIAGNOSIS — M170 Bilateral primary osteoarthritis of knee: Secondary | ICD-10-CM

## 2021-05-25 DIAGNOSIS — Z79899 Other long term (current) drug therapy: Secondary | ICD-10-CM

## 2021-05-25 DIAGNOSIS — M19071 Primary osteoarthritis, right ankle and foot: Secondary | ICD-10-CM

## 2021-05-25 DIAGNOSIS — I7300 Raynaud's syndrome without gangrene: Secondary | ICD-10-CM

## 2021-05-25 DIAGNOSIS — Z8669 Personal history of other diseases of the nervous system and sense organs: Secondary | ICD-10-CM

## 2021-05-25 DIAGNOSIS — M436 Torticollis: Secondary | ICD-10-CM

## 2021-05-25 DIAGNOSIS — M5136 Other intervertebral disc degeneration, lumbar region: Secondary | ICD-10-CM

## 2021-05-25 DIAGNOSIS — E559 Vitamin D deficiency, unspecified: Secondary | ICD-10-CM

## 2021-05-25 DIAGNOSIS — M19041 Primary osteoarthritis, right hand: Secondary | ICD-10-CM

## 2021-05-25 DIAGNOSIS — M3219 Other organ or system involvement in systemic lupus erythematosus: Secondary | ICD-10-CM

## 2021-05-25 DIAGNOSIS — Z9181 History of falling: Secondary | ICD-10-CM

## 2021-05-27 ENCOUNTER — Ambulatory Visit: Payer: Medicare PPO | Admitting: Internal Medicine

## 2021-05-27 ENCOUNTER — Encounter: Payer: Self-pay | Admitting: Internal Medicine

## 2021-05-27 ENCOUNTER — Other Ambulatory Visit: Payer: Self-pay

## 2021-05-27 VITALS — BP 138/70 | HR 68 | Temp 97.6°F | Ht 64.5 in | Wt 172.4 lb

## 2021-05-27 DIAGNOSIS — I10 Essential (primary) hypertension: Secondary | ICD-10-CM | POA: Diagnosis not present

## 2021-05-27 DIAGNOSIS — M542 Cervicalgia: Secondary | ICD-10-CM | POA: Diagnosis not present

## 2021-05-27 DIAGNOSIS — G8929 Other chronic pain: Secondary | ICD-10-CM | POA: Diagnosis not present

## 2021-05-27 DIAGNOSIS — Z23 Encounter for immunization: Secondary | ICD-10-CM | POA: Diagnosis not present

## 2021-05-27 DIAGNOSIS — F4321 Adjustment disorder with depressed mood: Secondary | ICD-10-CM

## 2021-05-27 DIAGNOSIS — Z6829 Body mass index (BMI) 29.0-29.9, adult: Secondary | ICD-10-CM | POA: Diagnosis not present

## 2021-05-27 DIAGNOSIS — E663 Overweight: Secondary | ICD-10-CM | POA: Diagnosis not present

## 2021-05-27 DIAGNOSIS — M79674 Pain in right toe(s): Secondary | ICD-10-CM

## 2021-05-27 MED ORDER — BACLOFEN 10 MG PO TABS: ORAL_TABLET | ORAL | 1 refills | Status: DC

## 2021-05-27 NOTE — Progress Notes (Signed)
I,Tianna Badgett,acting as a Education administrator for Maximino Greenland, MD.,have documented all relevant documentation on the behalf of Maximino Greenland, MD,as directed by  Maximino Greenland, MD while in the presence of Maximino Greenland, MD.  This visit occurred during the SARS-CoV-2 public health emergency.  Safety protocols were in place, including screening questions prior to the visit, additional usage of staff PPE, and extensive cleaning of exam room while observing appropriate contact time as indicated for disinfecting solutions.  Subjective:     Patient ID: Jessica Booker , female    DOB: 09-18-1940 , 80 y.o.   MRN: [id number]   Chief Complaint  Patient presents with   Hypertension    HPI  She presents for bp check. She reports compliance with meds. She denies headaches, chest pain and shortness of breath.   Unfortunately, her husband passed on 05/22/21. Unfortunately, he passed during his ER evaluation. She states she often replays the scene in her mind.   Hypertension This is a chronic problem. The current episode started more than 1 year ago. The problem has been gradually improving since onset. The problem is controlled. Associated symptoms include anxiety and neck pain (c/o neck pain. Denies UEweakness/tingling. Muscles feel tight. ). Pertinent negatives include no headaches, palpitations or peripheral edema. Risk factors for coronary artery disease include sedentary lifestyle. Past treatments include diuretics. There are no compliance problems.  There is no history of angina. There is no history of chronic renal disease.    Past Medical History:  Diagnosis Date   Asthma    Hypertension    Lupus ([place])      Family History  Problem Relation Age of Onset   Heart attack Mother    Stomach cancer Father    Hypertension Sister    Heart disease Sister      Current Outpatient Medications:    baclofen (LIORESAL) 10 MG tablet, One tab po twice daily as needed, Disp: 30 tablet, Rfl: 1    aspirin 81 MG tablet, Take 81 mg by mouth daily. Takes as needed, Disp: , Rfl:    diclofenac sodium (VOLTAREN) 1 % GEL, Apply 2 g to 4 g to affected joint up to 4 times daily PRN., Disp: 4 Tube, Rfl: 2   hydroxychloroquine (PLAQUENIL) 200 MG tablet, TAKE 1 TABLET BY MOUTH TWICE DAILY MONDAY THROUGH FRIDAY ONLY, Disp: 120 tablet, Rfl: 0   linaclotide (LINZESS) 72 MCG capsule, Take 1 capsule (72 mcg total) by mouth daily before breakfast., Disp: 30 capsule, Rfl: 1   loratadine (CLARITIN) 10 MG tablet, TAKE 1 TABLET BY MOUTH EVERY DAY (Patient taking differently: as needed.), Disp: 30 tablet, Rfl: 1   NIFEdipine (PROCARDIA XL/NIFEDICAL XL) 60 MG 24 hr tablet, TAKE 1 TABLET BY MOUTH EVERY DAY, Disp: 90 tablet, Rfl: 1   valsartan-hydrochlorothiazide (DIOVAN-HCT) 320-12.5 MG tablet, TAKE 1 TABLET BY MOUTH EVERY DAY IN THE MORNING, Disp: 90 tablet, Rfl: 1   No Known Allergies   Review of Systems  Constitutional: Negative.   Respiratory: Negative.    Cardiovascular: Negative.  Negative for palpitations.  Gastrointestinal: Negative.   Musculoskeletal:  Positive for arthralgias and neck pain (c/o neck pain. Denies UEweakness/tingling. Muscles feel tight. ).       She c/o r 5th toe pain. Denies fall/trauma. Not sure what could have caused her sx. Unable to wear certain shoes. Wants to see a specialist.   Additionally, c/o neck pain. Not sure what may have triggered her sx. Denies b/l UE paresthesias/weakness.  Neurological: Negative.  Negative for headaches.    Today's Vitals   05/27/21 0927  BP: 138/70  Pulse: 68  Temp: 97.6 F (36.4 C)  TempSrc: Oral  Weight: 172 lb 6.4 oz (78.2 kg)  Height: 5' 4.5" (1.638 m)  PainSc: 6    Body mass index is 29.14 kg/m.  Wt Readings from Last 3 Encounters:  05/27/21 172 lb 6.4 oz (78.2 kg)  02/18/21 174 lb (78.9 kg)  01/19/21 175 lb 3.2 oz (79.5 kg)    Objective:  Physical Exam Vitals and nursing note reviewed.  Constitutional:      Appearance:  Normal appearance.  HENT:     Head: Normocephalic and atraumatic.     Nose:     Comments: Masked     Mouth/Throat:     Comments: Masked  Neck:     Thyroid: No thyromegaly.  Cardiovascular:     Rate and Rhythm: Normal rate and regular rhythm.     Pulses:          Dorsalis pedis pulses are 0 on the right side and 3+ on the left side.     Heart sounds: Normal heart sounds.  Pulmonary:     Effort: Pulmonary effort is normal.     Breath sounds: Normal breath sounds.  Musculoskeletal:     Cervical back: Normal range of motion. Muscular tenderness present. Normal range of motion.     Comments: R 5th toe tenderness, no erythema. Callus on R 2nd toe.     Feet:     Right foot:     Skin integrity: Skin integrity normal.     Left foot:     Skin integrity: Skin integrity normal.  Skin:    General: Skin is warm.  Neurological:     General: No focal deficit present.     Mental Status: She is alert.  Psychiatric:        Mood and Affect: Mood normal.        Behavior: Behavior normal.        Assessment And Plan:     1. Essential hypertension Comments: Chronic, fair control. NO med changes today. Encouraged to follow low sodium diet. I will check renal function today.  - CMP14+EGFR - Insulin, random(561)  2. Toe pain, chronic, right Comments: I will refer her to Podiatry as requested.  - Ambulatory referral to Podiatry  3. Cervicalgia Comments: I will send rx baclofen twice daily prn. Also advised to apply topical pain cream to affected area prn.   4. Grief Comments: I will refer her to Hospice for grief counseling. She is in agreement with her treatment plan.  - Ambulatory referral to Hospice  5. Overweight with body mass index (BMI) of 29 to 29.9 in adult Comments: Her BMI is acceptable for her demographic. Encouraged to aim for at least 150 minutes of exercise per week.   6. Immunization due Comments: She was given high dose flu vaccine x 1.  - Flu Vaccine QUAD High  Dose(Fluad)  Patient was given opportunity to ask questions. Patient verbalized understanding of the plan and was able to repeat key elements of the plan. All questions were answered to their satisfaction.   I, Maximino Greenland, MD, have reviewed all documentation for this visit. The documentation on 05/27/21 for the exam, diagnosis, procedures, and orders are all accurate and complete.   IF YOU HAVE BEEN REFERRED TO A SPECIALIST, IT MAY TAKE 1-2 WEEKS TO SCHEDULE/PROCESS THE REFERRAL. IF YOU HAVE NOT  HEARD FROM US/SPECIALIST IN TWO WEEKS, PLEASE GIVE [place] A CALL AT [phone] X [phone].   THE PATIENT IS ENCOURAGED TO PRACTICE SOCIAL DISTANCING DUE TO THE COVID-19 PANDEMIC.

## 2021-05-27 NOTE — Patient Instructions (Signed)

## 2021-05-28 LAB — CMP14+EGFR
ALT: 15 IU/L (ref 0–32)
AST: 15 IU/L (ref 0–40)
Albumin/Globulin Ratio: 1.5 (ref 1.2–2.2)
Albumin: 4.6 g/dL (ref 3.7–4.7)
Alkaline Phosphatase: 95 IU/L (ref 44–121)
BUN/Creatinine Ratio: 17 (ref 12–28)
BUN: 15 mg/dL (ref 8–27)
Bilirubin Total: 0.4 mg/dL (ref 0.0–1.2)
CO2: 25 mmol/L (ref 20–29)
Calcium: 10 mg/dL (ref 8.7–10.3)
Chloride: 104 mmol/L (ref 96–106)
Creatinine, Ser: 0.87 mg/dL (ref 0.57–1.00)
Globulin, Total: 3.1 g/dL (ref 1.5–4.5)
Glucose: 91 mg/dL (ref 70–99)
Potassium: 4.4 mmol/L (ref 3.5–5.2)
Sodium: 141 mmol/L (ref 134–144)
Total Protein: 7.7 g/dL (ref 6.0–8.5)
eGFR: 67 mL/min/{1.73_m2} (ref >59)

## 2021-05-28 LAB — INSULIN, RANDOM: INSULIN: 17.1 u[IU]/mL (ref 2.6–24.9)

## 2021-06-03 ENCOUNTER — Other Ambulatory Visit: Payer: Self-pay | Admitting: Internal Medicine

## 2021-06-04 ENCOUNTER — Other Ambulatory Visit: Payer: Self-pay

## 2021-06-04 ENCOUNTER — Ambulatory Visit: Payer: Medicare PPO | Admitting: Sports Medicine

## 2021-06-04 ENCOUNTER — Ambulatory Visit (INDEPENDENT_AMBULATORY_CARE_PROVIDER_SITE_OTHER): Payer: Medicare PPO

## 2021-06-04 ENCOUNTER — Other Ambulatory Visit: Payer: Self-pay | Admitting: Sports Medicine

## 2021-06-04 ENCOUNTER — Encounter: Payer: Self-pay | Admitting: Sports Medicine

## 2021-06-04 DIAGNOSIS — M778 Other enthesopathies, not elsewhere classified: Secondary | ICD-10-CM | POA: Diagnosis not present

## 2021-06-04 DIAGNOSIS — M79671 Pain in right foot: Secondary | ICD-10-CM

## 2021-06-04 DIAGNOSIS — M2042 Other hammer toe(s) (acquired), left foot: Secondary | ICD-10-CM | POA: Diagnosis not present

## 2021-06-04 DIAGNOSIS — B359 Dermatophytosis, unspecified: Secondary | ICD-10-CM

## 2021-06-04 DIAGNOSIS — L84 Corns and callosities: Secondary | ICD-10-CM | POA: Diagnosis not present

## 2021-06-04 DIAGNOSIS — L988 Other specified disorders of the skin and subcutaneous tissue: Secondary | ICD-10-CM

## 2021-06-04 DIAGNOSIS — M79672 Pain in left foot: Secondary | ICD-10-CM | POA: Diagnosis not present

## 2021-06-04 DIAGNOSIS — M2041 Other hammer toe(s) (acquired), right foot: Secondary | ICD-10-CM | POA: Diagnosis not present

## 2021-06-04 DIAGNOSIS — M779 Enthesopathy, unspecified: Secondary | ICD-10-CM

## 2021-06-04 DIAGNOSIS — M2040 Other hammer toe(s) (acquired), unspecified foot: Secondary | ICD-10-CM

## 2021-06-04 MED ORDER — TRIAMCINOLONE ACETONIDE 10 MG/ML IJ SUSP
10.0000 mg | Freq: Once | INTRAMUSCULAR | Status: AC
  Administered 2021-06-04: 10 mg

## 2021-06-04 MED ORDER — CLOTRIMAZOLE 1 % EX SOLN: 1.0000 "application " | Freq: Two times a day (BID) | CUTANEOUS | 5 refills | Status: DC

## 2021-06-04 NOTE — Progress Notes (Signed)
Subjective: Jessica Booker is a 80 y.o. female patient who presents to office for evaluation of Right> Left foot pain. Patient reports that pain is worse at the right fourth and fifth toes states that sometimes she has sharp pain that wakes her up at night as well as very tough skin in between the toes.  Patient reports that her toes have been this way for a really long time and states that she has been wearing comfortable shoes to try pressure to prevent rubbing but still sometimes at bedtime when she takes her shoes off and in the middle the night when she is sleeping has sharp pain at the toes that sometimes wakes her up at night.  Patient does admit to a history of lupus.  Patient denies any other pedal complaints.   Patient Active Problem List   Diagnosis Date Noted   Dyspnea 04/11/2019   Belching 04/11/2019   Constipation 04/11/2019   Essential hypertension 11/02/2018   Lupus (HCC) 11/02/2018   Primary osteoarthritis of both knees 06/06/2018   Cough variant asthma 08/25/2017   Systemic lupus erythematosus (HCC) 02/18/2017   High risk medication use 02/18/2017   Raynaud's disease without gangrene 02/18/2017   DDD (degenerative disc disease), lumbar 02/18/2017   Primary osteoarthritis of both hands 02/18/2017   Primary osteoarthritis of both feet 02/18/2017   Vitamin D deficiency 02/18/2017   History of hypertension 02/18/2017   History of neuropathy 02/18/2017    Current Outpatient Medications on File Prior to Visit  Medication Sig Dispense Refill   aspirin 81 MG tablet Take 81 mg by mouth daily. Takes as needed     baclofen (LIORESAL) 10 MG tablet One tab po twice daily as needed 30 tablet 1   diclofenac sodium (VOLTAREN) 1 % GEL Apply 2 g to 4 g to affected joint up to 4 times daily PRN. 4 Tube 2   hydroxychloroquine (PLAQUENIL) 200 MG tablet TAKE 1 TABLET BY MOUTH TWICE DAILY MONDAY THROUGH FRIDAY ONLY 120 tablet 0   linaclotide (LINZESS) 72 MCG capsule Take 1 capsule (72 mcg  total) by mouth daily before breakfast. 30 capsule 1   loratadine (CLARITIN) 10 MG tablet TAKE 1 TABLET BY MOUTH EVERY DAY (Patient taking differently: as needed.) 30 tablet 1   NIFEdipine (PROCARDIA XL/NIFEDICAL XL) 60 MG 24 hr tablet TAKE 1 TABLET BY MOUTH EVERY DAY 90 tablet 1   valsartan-hydrochlorothiazide (DIOVAN-HCT) 320-12.5 MG tablet TAKE 1 TABLET BY MOUTH EVERY DAY IN THE MORNING 90 tablet 1   No current facility-administered medications on file prior to visit.    No Known Allergies  Objective:  General: Alert and oriented x3 in no acute distress  Dermatology: Small hyperkeratotic lesion overlying 2 PIPJ dorsally bilateral and medial second toe on the left, there is also keratosis noted in the fourth webspace on the right foot consistent with interdigital corn with maceration possible superimposed tinea. No open lesions bilateral lower extremities, no webspace macerations, no ecchymosis bilateral, all nails x 10 are well manicured.  Vascular: Dorsalis Pedis and Posterior Tibial pedal pulses 1 /4, Capillary Fill Time 3 seconds,(+) pedal hair growth bilateral, no edema bilateral lower extremities, Temperature gradient within normal limits.  Neurology: Michaell Cowing sensation intact via light touch bilateral, subjective sharp shooting pain to the toes especially right fourth and fifth toes  Musculoskeletal: Rigid hammertoes noted bilateral with varus rotation at fifth toes right fifth toe is noted to be be most rotated.  There is also significant bunion deformity with second toe  crossover noted bilateral.  Pes planus foot type.  Gait: Unassisted, Non-antalgic.  Xrays  Right/Left Foot    Impression: Decreased joint space at first MPJ with inner metatarsal angle increased supportive of bunion deformity there is significant hallux pronation and second toe crossover with dorsal dislocated second toe and lesser hammertoe deformities, there is varus rotation to the fifth toes bilateral with the  right fifth toe most involved, no acute fracture, soft tissue margins within normal limits.       Assessment and Plan: Problem List Items Addressed This Visit   None Visit Diagnoses     Pain in both feet    -  Primary   Hammer toe, unspecified laterality       Tinea       Relevant Medications   clotrimazole (LOTRIMIN) 1 % external solution   Maceration of skin       Corns and callosities       Capsulitis            -Complete examination performed -Xrays reviewed -Discussed treatement options for pain to toes with interdigital corn maceration tinea likely secondary to mechanical toe deformity -Rx clotrimazole solution for patient to use at fourth webspace advised patient that we will allow this medication to help soften the corn if present at next visit we may attempt interdigital trimming of the corn however must be careful to avoid infection at this time for symptomatic relief I did administer an injection for pain at the dorsal fourth webspace, injected a mixture containing 1 ml of 2%  plain lidocaine, 1 ml 0.5% plain marcaine, 0.5 ml of kenalog 10 and 0.5 ml of dexamethasone phosphate into right fourth dorsal webspace without complication. Post-injection care discussed with patient.  -Advised good supportive shoes daily for foot type that avoids crowding of toes and rubbing -Advised patient that her underlying history of lupus can at 2 inflammatory pain and toe deformity/arthritis of toes -At no additional charge mechanically debrided keratosis at left second toe medial aspect without incident using a sterile chisel blade -Patient to return to office 4 to 6 weeks for medication and corn check or sooner if condition worsens.  Asencion Islam, DPM

## 2021-06-24 ENCOUNTER — Ambulatory Visit
Admission: RE | Admit: 2021-06-24 | Discharge: 2021-06-24 | Disposition: A | Discharge status: Home / Self Care | Payer: Medicare PPO | Source: Ambulatory Visit | Attending: Internal Medicine | Admitting: Internal Medicine

## 2021-06-24 ENCOUNTER — Other Ambulatory Visit: Payer: Self-pay

## 2021-06-24 DIAGNOSIS — Z1231 Encounter for screening mammogram for malignant neoplasm of breast: Secondary | ICD-10-CM

## 2021-06-30 ENCOUNTER — Encounter: Payer: Self-pay | Admitting: Nurse Practitioner

## 2021-06-30 ENCOUNTER — Other Ambulatory Visit: Payer: Self-pay

## 2021-06-30 ENCOUNTER — Ambulatory Visit (INDEPENDENT_AMBULATORY_CARE_PROVIDER_SITE_OTHER): Payer: Medicare PPO | Admitting: Nurse Practitioner

## 2021-06-30 DIAGNOSIS — J392 Other diseases of pharynx: Secondary | ICD-10-CM

## 2021-06-30 DIAGNOSIS — R051 Acute cough: Secondary | ICD-10-CM

## 2021-06-30 DIAGNOSIS — R0982 Postnasal drip: Secondary | ICD-10-CM | POA: Diagnosis not present

## 2021-06-30 LAB — POCT RAPID STREP A (OFFICE): Rapid Strep A Screen: NEGATIVE

## 2021-06-30 LAB — POC COVID19 BINAXNOW: SARS Coronavirus 2 Ag: NEGATIVE

## 2021-06-30 MED ORDER — AMOXICILLIN 500 MG PO CAPS: 500.0000 mg | ORAL_CAPSULE | Freq: Two times a day (BID) | ORAL | 0 refills | Status: DC

## 2021-06-30 MED ORDER — ALBUTEROL SULFATE HFA 108 (90 BASE) MCG/ACT IN AERS: 2.0000 | INHALATION_SPRAY | Freq: Four times a day (QID) | RESPIRATORY_TRACT | 0 refills | Status: DC | PRN

## 2021-06-30 NOTE — Progress Notes (Signed)
I,Yamilka J Llittleton,acting as a Neurosurgeon for [employer], FNP.,have documented all relevant documentation on the behalf of Arnette Felts, FNP,as directed by  Arnette Felts, FNP while in the presence of Arnette Felts, FNP.   This visit occurred during the SARS-CoV-2 public health emergency.  Safety protocols were in place, including screening questions prior to the visit, additional usage of staff PPE, and extensive cleaning of exam room while observing appropriate contact time as indicated for disinfecting solutions.  Subjective:     Patient ID: Jessica Booker , female    DOB: [birth year] , 80 y.o.   MRN: [id number]   Chief Complaint  Patient presents with   URI    HPI  Patient seen outside at her vehicle.   URI  This is a new problem. The current episode started in the past 7 days. The problem has been unchanged. There has been no fever. Associated symptoms include coughing. Pertinent negatives include no abdominal pain, chest pain, congestion, dysuria, headaches, rhinorrhea, sinus pain, sore throat or wheezing. Associated symptoms comments: Postnasal drip. Treatments tried: mucinex, cough syrup. The treatment provided mild relief.    Past Medical History:  Diagnosis Date   Asthma    Hypertension    Lupus (HCC)      Family History  Problem Relation Age of Onset   Heart attack Mother    Stomach cancer Father    Hypertension Sister    Heart disease Sister      Current Outpatient Medications:    aspirin 81 MG tablet, Take 81 mg by mouth daily. Takes as needed, Disp: , Rfl:    baclofen (LIORESAL) 10 MG tablet, One tab po twice daily as needed, Disp: 30 tablet, Rfl: 1   clotrimazole (LOTRIMIN) 1 % external solution, Apply 1 application topically 2 (two) times daily. In between toes, Disp: 60 mL, Rfl: 5   diclofenac sodium (VOLTAREN) 1 % GEL, Apply 2 g to 4 g to affected joint up to 4 times daily PRN., Disp: 4 Tube, Rfl: 2   hydroxychloroquine (PLAQUENIL) 200 MG tablet, TAKE 1  TABLET BY MOUTH TWICE DAILY MONDAY THROUGH FRIDAY ONLY, Disp: 120 tablet, Rfl: 0   linaclotide (LINZESS) 72 MCG capsule, Take 1 capsule (72 mcg total) by mouth daily before breakfast., Disp: 30 capsule, Rfl: 1   NIFEdipine (PROCARDIA XL/NIFEDICAL XL) 60 MG 24 hr tablet, TAKE 1 TABLET BY MOUTH EVERY DAY, Disp: 90 tablet, Rfl: 1   valsartan-hydrochlorothiazide (DIOVAN-HCT) 320-12.5 MG tablet, TAKE 1 TABLET BY MOUTH EVERY DAY IN THE MORNING, Disp: 90 tablet, Rfl: 1   loratadine (CLARITIN) 10 MG tablet, TAKE 1 TABLET BY MOUTH EVERY DAY (Patient not taking: Reported on 06/30/2021), Disp: 30 tablet, Rfl: 1   No Known Allergies   Review of Systems  Constitutional: Negative.  Negative for fatigue.  HENT:  Positive for postnasal drip. Negative for congestion, rhinorrhea, sinus pressure, sinus pain and sore throat.   Eyes: Negative.   Respiratory:  Positive for cough. Negative for wheezing.   Cardiovascular: Negative.  Negative for chest pain, palpitations and leg swelling.  Gastrointestinal:  Negative for abdominal pain.  Genitourinary:  Negative for dysuria.  Neurological:  Negative for dizziness and headaches.  Psychiatric/Behavioral: Negative.      There were no vitals filed for this visit. There is no height or weight on file to calculate BMI.   Objective:  Physical Exam Vitals reviewed.  Constitutional:      General: She is not in acute distress.    Appearance:  Normal appearance.  Cardiovascular:     Rate and Rhythm: Normal rate and regular rhythm.     Pulses: Normal pulses.     Heart sounds: Normal heart sounds. No murmur heard. Pulmonary:     Effort: Pulmonary effort is normal. No respiratory distress.     Breath sounds: Normal breath sounds. No wheezing.     Comments: Course breath sounds bilaterally upper lobes Musculoskeletal:     Cervical back: Normal range of motion and neck supple. No rigidity or tenderness.  Neurological:     General: No focal deficit present.     Mental  Status: She is alert and oriented to person, place, and time.     Cranial Nerves: No cranial nerve deficit.     Motor: No weakness.  Psychiatric:        Mood and Affect: Mood normal.        Behavior: Behavior normal.        Thought Content: Thought content normal.        Judgment: Judgment normal.        Assessment And Plan:     1. Post-nasal drainage Comments: Negative strep and rapid strep, sent for Covid PCR  - Novel Coronavirus, NAA ([facility])  2. Acute cough Comments: continue cough syrup - Novel Coronavirus, NAA ([facility]) - POC COVID-19  3. Throat irritation Comments: Encouraged to do hot tea and honey or salt water gargles - POCT rapid strep A    Patient was given opportunity to ask questions. Patient verbalized understanding of the plan and was able to repeat key elements of the plan. All questions were answered to their satisfaction.  Arnette Felts, FNP   I, Arnette Felts, FNP, have reviewed all documentation for this visit. The documentation on 06/30/21 for the exam, diagnosis, procedures, and orders are all accurate and complete.   IF YOU HAVE BEEN REFERRED TO A SPECIALIST, IT MAY TAKE 1-2 WEEKS TO SCHEDULE/PROCESS THE REFERRAL. IF YOU HAVE NOT HEARD FROM US/SPECIALIST IN TWO WEEKS, PLEASE GIVE [place] A CALL AT [phone] X [phone].   THE PATIENT IS ENCOURAGED TO PRACTICE SOCIAL DISTANCING DUE TO THE COVID-19 PANDEMIC.

## 2021-07-01 LAB — SARS-COV-2, NAA 2 DAY TAT

## 2021-07-01 LAB — NOVEL CORONAVIRUS, NAA: SARS-CoV-2, NAA: NOT DETECTED

## 2021-07-11 ENCOUNTER — Other Ambulatory Visit: Payer: Self-pay | Admitting: Internal Medicine

## 2021-07-16 ENCOUNTER — Ambulatory Visit: Payer: Medicare PPO | Admitting: Sports Medicine

## 2021-07-16 ENCOUNTER — Other Ambulatory Visit: Payer: Self-pay

## 2021-07-16 DIAGNOSIS — M79671 Pain in right foot: Secondary | ICD-10-CM

## 2021-07-16 DIAGNOSIS — M2040 Other hammer toe(s) (acquired), unspecified foot: Secondary | ICD-10-CM

## 2021-07-16 DIAGNOSIS — L988 Other specified disorders of the skin and subcutaneous tissue: Secondary | ICD-10-CM | POA: Diagnosis not present

## 2021-07-16 DIAGNOSIS — L84 Corns and callosities: Secondary | ICD-10-CM

## 2021-07-16 NOTE — Progress Notes (Signed)
Subjective: Jessica Booker is a 80 y.o. female patient who returns to office for follow up evaluation of right foot pain. Reports that pain is better but has some rubbing at the right 2nd toe,  Patient denies any other pedal complaints.   Patient Active Problem List   Diagnosis Date Noted   Dyspnea 04/11/2019   Belching 04/11/2019   Constipation 04/11/2019   Essential hypertension 11/02/2018   Lupus (HCC) 11/02/2018   Primary osteoarthritis of both knees 06/06/2018   Cough variant asthma 08/25/2017   Systemic lupus erythematosus (HCC) 02/18/2017   High risk medication use 02/18/2017   Raynaud's disease without gangrene 02/18/2017   DDD (degenerative disc disease), lumbar 02/18/2017   Primary osteoarthritis of both hands 02/18/2017   Primary osteoarthritis of both feet 02/18/2017   Vitamin D deficiency 02/18/2017   History of hypertension 02/18/2017   History of neuropathy 02/18/2017    Current Outpatient Medications on File Prior to Visit  Medication Sig Dispense Refill   albuterol (VENTOLIN HFA) 108 (90 Base) MCG/ACT inhaler Inhale 2 puffs into the lungs every 6 (six) hours as needed for wheezing or shortness of breath. 18 g 0   amoxicillin (AMOXIL) 500 MG capsule Take 1 capsule (500 mg total) by mouth 2 (two) times daily. 14 capsule 0   aspirin 81 MG tablet Take 81 mg by mouth daily. Takes as needed     baclofen (LIORESAL) 10 MG tablet One tab po twice daily as needed 30 tablet 1   clotrimazole (LOTRIMIN) 1 % external solution Apply 1 application topically 2 (two) times daily. In between toes 60 mL 5   diclofenac sodium (VOLTAREN) 1 % GEL Apply 2 g to 4 g to affected joint up to 4 times daily PRN. 4 Tube 2   hydroxychloroquine (PLAQUENIL) 200 MG tablet TAKE 1 TABLET BY MOUTH TWICE DAILY MONDAY THROUGH FRIDAY ONLY 120 tablet 0   linaclotide (LINZESS) 72 MCG capsule Take 1 capsule (72 mcg total) by mouth daily before breakfast. 30 capsule 1   loratadine (CLARITIN) 10 MG tablet  TAKE 1 TABLET BY MOUTH EVERY DAY (Patient not taking: Reported on 06/30/2021) 30 tablet 1   NIFEdipine (ADALAT CC) 60 MG 24 hr tablet TAKE 1 TABLET BY MOUTH EVERY DAY 90 tablet 1   valsartan-hydrochlorothiazide (DIOVAN-HCT) 320-12.5 MG tablet TAKE 1 TABLET BY MOUTH EVERY DAY IN THE MORNING 90 tablet 1   No current facility-administered medications on file prior to visit.    No Known Allergies  Objective:  General: Alert and oriented x3 in no acute distress  Dermatology: Small hyperkeratotic lesion overlying 2 PIPJ dorsally bilateral and medial second toe on the left, there is also keratosis noted in the fourth webspace on the right foot consistent with interdigital corn,  no ecchymosis bilateral, all nails x 10 are well manicured.  Vascular: Dorsalis Pedis and Posterior Tibial pedal pulses 1 /4, Capillary Fill Time 3 seconds,(+) pedal hair growth bilateral, no edema bilateral lower extremities, Temperature gradient within normal limits.  Neurology: Michaell Cowing sensation intact via light touch bilateral, subjective sharp shooting pain to the toes especially right fourth and fifth toes  Musculoskeletal: Rigid hammertoes noted bilateral with varus rotation at fifth toes right fifth toe is noted to be be most rotated.  There is also significant bunion deformity with second toe crossover noted bilateral.  Pes planus foot type.        Assessment and Plan: Problem List Items Addressed This Visit   None Visit Diagnoses  Maceration of skin    -  Primary   Corns and callosities       Hammer toe, unspecified laterality       Right foot pain            -Complete examination performed -Continue with clotrimazole solution at right 4-5 toes -Dispensed toe cap to right 2nd toe -Advised patient to wear shoes that are comfortable that do no rub -Patient to return to office PRN   Asencion Islam, DPM

## 2021-07-22 ENCOUNTER — Ambulatory Visit: Payer: Medicare PPO | Admitting: Cardiology

## 2021-07-22 ENCOUNTER — Other Ambulatory Visit: Payer: Self-pay | Admitting: Nurse Practitioner

## 2021-07-24 NOTE — Progress Notes (Signed)
Office Visit Note  Patient: Jessica Booker             Date of Birth: 1940-09-25           MRN: [id number]             PCP: Glendale Chard, MD Referring: Glendale Chard, MD Visit Date: 07/31/2021 Occupation: @GUAROCC @  Subjective:  Medication management.   History of Present Illness: Jessica Booker is a 80 y.o. female with a history of systemic lupus erythematosus, osteoarthritis and disc disease.  She states she has been under a lot of stress as she lost her husband in October 2022.  She has been experiencing increased pain and discomfort in her neck especially in the left trapezius region.  She also complains of discomfort in her left shoulder and left trochanteric area.  She continues to have some stiffness in her hands, knee joints and her feet.  Activities of Daily Living:  Patient reports morning stiffness for 0 minutes.   Patient Reports nocturnal pain.  Difficulty dressing/grooming: Denies Difficulty climbing stairs: Denies Difficulty getting out of chair: Denies Difficulty using hands for taps, buttons, cutlery, and/or writing: Denies  Review of Systems  Constitutional:  Negative for fatigue, night sweats, weight gain and weight loss.  HENT:  Negative for mouth sores, trouble swallowing, trouble swallowing, mouth dryness and nose dryness.   Eyes:  Negative for pain, redness, itching, visual disturbance and dryness.  Respiratory:  Negative for cough, shortness of breath and difficulty breathing.   Cardiovascular:  Negative for chest pain, palpitations, hypertension, irregular heartbeat and swelling in legs/feet.  Gastrointestinal:  Negative for blood in stool, constipation and diarrhea.  Endocrine: Negative for increased urination.  Genitourinary:  Negative for difficulty urinating and vaginal dryness.  Musculoskeletal:  Positive for joint pain, joint pain, myalgias and myalgias. Negative for joint swelling, muscle weakness, morning stiffness and muscle tenderness.   Skin:  Negative for color change, rash, hair loss, redness, skin tightness, ulcers and sensitivity to sunlight.  Allergic/Immunologic: Negative for susceptible to infections.  Neurological:  Positive for numbness. Negative for dizziness, headaches, memory loss, night sweats and weakness.  Hematological:  Negative for bruising/bleeding tendency and swollen glands.  Psychiatric/Behavioral:  Negative for depressed mood, confusion and sleep disturbance. The patient is not nervous/anxious.    PMFS History:  Patient Active Problem List   Diagnosis Date Noted   Dyspnea 04/11/2019   Belching 04/11/2019   Constipation 04/11/2019   Essential hypertension 11/02/2018   Lupus ([place]) 11/02/2018   Primary osteoarthritis of both knees 06/06/2018   Cough variant asthma 08/25/2017   Systemic lupus erythematosus ([place]) 02/18/2017   High risk medication use 02/18/2017   Raynaud's disease without gangrene 02/18/2017   DDD (degenerative disc disease), lumbar 02/18/2017   Primary osteoarthritis of both hands 02/18/2017   Primary osteoarthritis of both feet 02/18/2017   Vitamin D deficiency 02/18/2017   History of hypertension 02/18/2017   History of neuropathy 02/18/2017    Past Medical History:  Diagnosis Date   Asthma    Hypertension    Lupus ([place])     Family History  Problem Relation Age of Onset   Heart attack Mother    Stomach cancer Father    Hypertension Sister    Heart disease Sister    Past Surgical History:  Procedure Laterality Date   ABDOMINAL HYSTERECTOMY     CHOLECYSTECTOMY     SHOULDER ARTHROSCOPY W/ ROTATOR CUFF REPAIR     R shoulder   TONSILLECTOMY  Social History   Social History Narrative   Not on file   Immunization History  Administered Date(s) Administered   Fluad Quad(high Dose 65+) 05/27/2021   Influenza, High Dose Seasonal PF 06/21/2017, 05/29/2018, 05/16/2019   Influenza-Unspecified 05/30/2018, 05/16/2019, 05/13/2020   PFIZER Comirnaty(Gray Top)Covid-19  Tri-Sucrose Vaccine 12/25/2020   PFIZER(Purple Top)SARS-COV-2 Vaccination 10/04/2019, 10/30/2019, 05/23/2020   Pneumococcal Conjugate-13 07/06/2019   Pneumococcal Polysaccharide-23 09/17/2013   Zoster Recombinat (Shingrix) 05/29/2018, 08/01/2018   Zoster, Live 10/17/2020     Objective: Vital Signs: BP (!) 149/76 (BP Location: Right Arm, Patient Position: Sitting, Cuff Size: Large)   Pulse (!) 48   Ht 5' 4.5" (1.638 m)   Wt 175 lb 6.4 oz (79.6 kg)   BMI 29.64 kg/m    Physical Exam Vitals and nursing note reviewed.  Constitutional:      Appearance: She is well-developed.  HENT:     Head: Normocephalic and atraumatic.  Eyes:     Conjunctiva/sclera: Conjunctivae normal.  Cardiovascular:     Rate and Rhythm: Normal rate and regular rhythm.     Heart sounds: Normal heart sounds.  Pulmonary:     Effort: Pulmonary effort is normal.     Breath sounds: Normal breath sounds.  Abdominal:     General: Bowel sounds are normal.     Palpations: Abdomen is soft.  Musculoskeletal:     Cervical back: Normal range of motion.  Lymphadenopathy:     Cervical: No cervical adenopathy.  Skin:    General: Skin is warm and dry.     Capillary Refill: Capillary refill takes less than 2 seconds.  Neurological:     Mental Status: She is alert and oriented to person, place, and time.  Psychiatric:        Behavior: Behavior normal.     Musculoskeletal Exam: C-spine was in good range of motion.  She had a spasm in the left trapezius region.  Shoulder joints were in good range of motion with discomfort on abduction of her left shoulder joint.  Elbow joints and wrist joints with good range of motion.  She had no MCP PIP or DIP tenderness or synovitis.  Hip joints with good range of motion.  She had discomfort range of motion of her left hip joint and over the left trochanteric area.  Knee joints and ankles with good range of motion.  She had no tenderness over MTPs.  CDAI Exam: CDAI Score: -- Patient  Global: --; Provider Global: -- Swollen: --; Tender: -- Joint Exam 07/31/2021   No joint exam has been documented for this visit   There is currently no information documented on the homunculus. Go to the Rheumatology activity and complete the homunculus joint exam.  Investigation: No additional findings.  Imaging: No results found.  Recent Labs: Lab Results  Component Value Date   WBC 7.2 11/20/2020   HGB 11.7 11/20/2020   PLT 262 11/20/2020   NA 141 05/27/2021   K 4.4 05/27/2021   CL 104 05/27/2021   CO2 25 05/27/2021   GLUCOSE 91 05/27/2021   BUN 15 05/27/2021   CREATININE 0.87 05/27/2021   BILITOT 0.4 05/27/2021   ALKPHOS 95 05/27/2021   AST 15 05/27/2021   ALT 15 05/27/2021   PROT 7.7 05/27/2021   ALBUMIN 4.6 05/27/2021   CALCIUM 10.0 05/27/2021   GFRAA 73 06/27/2020    Speciality Comments: PLQ Eye Exam: 01/12/2021 WNL @ [facility] Follow up in 1 year.  Procedures:  Large Joint Inj: L greater  trochanter on 07/31/2021 12:36 PM Indications: pain Details: 27 G 1.5 in needle, lateral approach  Arthrogram: No  Medications: 40 mg triamcinolone acetonide 40 MG/ML; 1.5 mL lidocaine 1 % Aspirate: 0 mL Outcome: tolerated well, no immediate complications Procedure, treatment alternatives, risks and benefits explained, specific risks discussed. Consent was given by the patient. Immediately prior to procedure a time out was called to verify the correct patient, procedure, equipment, support staff and site/side marked as required. Patient was prepped and draped in the usual sterile fashion.    Allergies: Patient has no known allergies.   Assessment / Plan:     Visit Diagnoses: Other systemic lupus erythematosus with other organ involvement ([place]) - -ANA, positive Smith, and positive Ro and RNP -she had no synovitis on examination.  There is no history of oral ulcers, nasal ulcers, malar rash, photosensitivity, lymphadenopathy or inflammatory arthritis.  I will  obtain autoimmune labs today.  Plan: Protein / creatinine ratio, urine, Anti-DNA antibody, double-stranded, C3 and C4, Sedimentation rate, RNP Antibody, Sjogrens syndrome-A extractable nuclear antibody, Anti-Smith antibody  High risk medication use - PLQ 200 mg 1 tablet by mouth twice daily M-F. PLQ Eye Exam: Jan 12, 2021.   - Plan: CBC with Differential/Platelet, COMPLETE METABOLIC PANEL WITH GFR today.  She has been advised to get annual eye eye examination.  Information regarding immunization was also placed in the AVS.  Raynaud's disease without gangrene-she is currently not very symptomatic.  Keeping core temperature warm and warm clothing was discussed.  Chronic left shoulder pain -she has been having pain and discomfort in the left shoulder and left trapezius region.  She has difficulty raising her left arm.  Plan: XR Shoulder Left.  X-rays showed acromioclavicular arthritis otherwise unremarkable.  Primary osteoarthritis of both hands-joint protection muscle strengthening was discussed.  Trochanteric bursitis, left hip-she had tenderness on palpation over left trochanteric area.  Pain in left hip -she complains of discomfort range of motion of her left hip joint.  Plan: XR HIP UNILAT W OR W/O PELVIS 2-3 VIEWS LEFT.  No hip joint narrowing was noted.  Unremarkable x-ray of the hip joint.  Primary osteoarthritis of both knees-patient states she had x-rays of her knee joints by an orthopedic surgeon.  She continues to have discomfort in her left knee joint.  No warmth swelling or effusion was noted.  Primary osteoarthritis of both feet-proper fitting shoes were discussed.  Neck stiffness - Multilevel severe spondylosis was noted.  She went to physical therapy but is still has ongoing pain.  She could not continue with physical therapy due to her husband's illness.  DDD (degenerative disc disease), lumbar-she continues to have some lower back pain.  History of neuropathy  Vitamin D  deficiency -she stopped taking vitamin D .  She has history of vitamin D deficiency.  Plan: VITAMIN D 25 Hydroxy (Vit-D Deficiency, Fractures)  History of hypertension-blood pressure is mildly elevated today.  Have advised her to monitor blood pressure closely and follow-up with her PCP.  Orders: Orders Placed This Encounter  Procedures   XR Shoulder Left   XR HIP UNILAT W OR W/O PELVIS 2-3 VIEWS LEFT   Protein / creatinine ratio, urine   CBC with Differential/Platelet   COMPLETE METABOLIC PANEL WITH GFR   Anti-DNA antibody, double-stranded   C3 and C4   Sedimentation rate   RNP Antibody   Sjogrens syndrome-A extractable nuclear antibody   Anti-Smith antibody   VITAMIN D 25 Hydroxy (Vit-D Deficiency, Fractures)    No  orders of the defined types were placed in this encounter.    Follow-Up Instructions: Return in about 5 months (around 12/29/2021) for Systemic lupus, Osteoarthritis.   Bo Merino, MD  Note - This record has been created using Editor, commissioning.  Chart creation errors have been sought, but may not always  have been located. Such creation errors do not reflect on  the standard of medical care.

## 2021-07-31 ENCOUNTER — Encounter: Payer: Self-pay | Admitting: Rheumatology

## 2021-07-31 ENCOUNTER — Ambulatory Visit: Payer: Self-pay

## 2021-07-31 ENCOUNTER — Other Ambulatory Visit: Payer: Self-pay

## 2021-07-31 ENCOUNTER — Ambulatory Visit: Payer: Medicare PPO | Admitting: Rheumatology

## 2021-07-31 VITALS — BP 149/76 | HR 48 | Ht 64.5 in | Wt 175.4 lb

## 2021-07-31 DIAGNOSIS — M170 Bilateral primary osteoarthritis of knee: Secondary | ICD-10-CM

## 2021-07-31 DIAGNOSIS — M3219 Other organ or system involvement in systemic lupus erythematosus: Secondary | ICD-10-CM

## 2021-07-31 DIAGNOSIS — G8929 Other chronic pain: Secondary | ICD-10-CM

## 2021-07-31 DIAGNOSIS — Z79899 Other long term (current) drug therapy: Secondary | ICD-10-CM | POA: Diagnosis not present

## 2021-07-31 DIAGNOSIS — M5136 Other intervertebral disc degeneration, lumbar region: Secondary | ICD-10-CM

## 2021-07-31 DIAGNOSIS — M25512 Pain in left shoulder: Secondary | ICD-10-CM

## 2021-07-31 DIAGNOSIS — E559 Vitamin D deficiency, unspecified: Secondary | ICD-10-CM | POA: Diagnosis not present

## 2021-07-31 DIAGNOSIS — Z8669 Personal history of other diseases of the nervous system and sense organs: Secondary | ICD-10-CM

## 2021-07-31 DIAGNOSIS — M436 Torticollis: Secondary | ICD-10-CM

## 2021-07-31 DIAGNOSIS — Z8679 Personal history of other diseases of the circulatory system: Secondary | ICD-10-CM

## 2021-07-31 DIAGNOSIS — I7300 Raynaud's syndrome without gangrene: Secondary | ICD-10-CM

## 2021-07-31 DIAGNOSIS — M19072 Primary osteoarthritis, left ankle and foot: Secondary | ICD-10-CM

## 2021-07-31 DIAGNOSIS — M19042 Primary osteoarthritis, left hand: Secondary | ICD-10-CM

## 2021-07-31 DIAGNOSIS — Z9181 History of falling: Secondary | ICD-10-CM

## 2021-07-31 DIAGNOSIS — M25552 Pain in left hip: Secondary | ICD-10-CM

## 2021-07-31 DIAGNOSIS — M7062 Trochanteric bursitis, left hip: Secondary | ICD-10-CM

## 2021-07-31 DIAGNOSIS — M19071 Primary osteoarthritis, right ankle and foot: Secondary | ICD-10-CM | POA: Diagnosis not present

## 2021-07-31 DIAGNOSIS — M19041 Primary osteoarthritis, right hand: Secondary | ICD-10-CM | POA: Diagnosis not present

## 2021-07-31 MED ORDER — LIDOCAINE HCL 1 % IJ SOLN
1.5000 mL | INTRAMUSCULAR | Status: AC | PRN
  Administered 2021-07-31: 1.5 mL

## 2021-07-31 MED ORDER — TRIAMCINOLONE ACETONIDE 40 MG/ML IJ SUSP
40.0000 mg | INTRAMUSCULAR | Status: AC | PRN
  Administered 2021-07-31: 40 mg via INTRA_ARTICULAR

## 2021-07-31 NOTE — Patient Instructions (Addendum)
Vaccines You are taking a medication(s) that can suppress your immune system.  The following immunizations are recommended: Flu annually Covid-19  Td/Tdap (tetanus, diphtheria, pertussis) every 10 years Pneumonia (Prevnar 15 then Pneumovax 23 at least 1 year apart.  Alternatively, can take Prevnar 20 without needing additional dose) Shingrix: 2 doses from 4 weeks to 6 months apart  Please check with your PCP to make sure you are up to date.   Iliotibial Band Syndrome Rehab Ask your health care provider which exercises are safe for you. Do exercises exactly as told by your health care provider and adjust them as directed. It is normal to feel mild stretching, pulling, tightness, or discomfort as you do these exercises. Stop right away if you feel sudden pain or your pain gets significantly worse. Do not begin these exercises until told by your health care provider. Stretching and range-of-motion exercises These exercises warm up your muscles and joints and improve the movement and flexibility of your hip and pelvis. Quadriceps stretch, prone  Lie on your abdomen (prone position) on a firm surface, such as a bed or padded floor. Bend your left / right knee and reach back to hold your ankle or pant leg. If you cannot reach your ankle or pant leg, loop a belt around your foot and grab the belt instead. Gently pull your heel toward your buttocks. Your knee should not slide out to the side. You should feel a stretch in the front of your thigh and knee (quadriceps). Hold this position for __________ seconds. Repeat __________ times. Complete this exercise __________ times a day. Iliotibial band stretch An iliotibial band is a strong band of muscle tissue that runs from the outer side of your hip to the outer side of your thigh and knee. Lie on your side with your left / right leg in the top position. Bend both of your knees and grab your left / right ankle. Stretch out your bottom arm to help you  balance. Slowly bring your top knee back so your thigh goes behind your trunk. Slowly lower your top leg toward the floor until you feel a gentle stretch on the outside of your left / right hip and thigh. If you do not feel a stretch and your knee will not fall farther, place the heel of your other foot on top of your knee and pull your knee down toward the floor with your foot. Hold this position for __________ seconds. Repeat __________ times. Complete this exercise __________ times a day. Strengthening exercises These exercises build strength and endurance in your hip and pelvis. Endurance is the ability to use your muscles for a long time, even after they get tired. Straight leg raises, side-lying This exercise strengthens the muscles that rotate the leg at the hip and move it away from your body (hip abductors). Lie on your side with your left / right leg in the top position. Lie so your head, shoulder, hip, and knee line up. You may bend your bottom knee to help you balance. Roll your hips slightly forward so your hips are stacked directly over each other and your left / right knee is facing forward. Tense the muscles in your outer thigh and lift your top leg 4-6 inches (10-15 cm). Hold this position for __________ seconds. Slowly lower your leg to return to the starting position. Let your muscles relax completely before doing another repetition. Repeat __________ times. Complete this exercise __________ times a day. Leg raises, prone This exercise  strengthens the muscles that move the hips backward (hip extensors). Lie on your abdomen (prone position) on your bed or a firm surface. You can put a pillow under your hips if that is more comfortable for your lower back. Bend your left / right knee so your foot is straight up in the air. Squeeze your buttocks muscles and lift your left / right thigh off the bed. Do not let your back arch. Tense your thigh muscle as hard as you can without  increasing any knee pain. Hold this position for __________ seconds. Slowly lower your leg to return to the starting position and allow it to relax completely. Repeat __________ times. Complete this exercise __________ times a day. Hip hike Stand sideways on a bottom step. Stand on your left / right leg with your other foot unsupported next to the step. You can hold on to a railing or wall for balance if needed. Keep your knees straight and your torso square. Then lift your left / right hip up toward the ceiling. Slowly let your left / right hip lower toward the floor, past the starting position. Your foot should get closer to the floor. Do not lean or bend your knees. Repeat __________ times. Complete this exercise __________ times a day. This information is not intended to replace advice given to you by your health care provider. Make sure you discuss any questions you have with your health care provider. Document Revised: 10/24/2019 Document Reviewed: 10/24/2019 Elsevier Patient Education  2022 [employer] Shoulder Exercises Ask your health care provider which exercises are safe for you. Do exercises exactly as told by your health care provider and adjust them as directed. It is normal to feel mild stretching, pulling, tightness, or discomfort as you do these exercises. Stop right away if you feel sudden pain or your pain gets worse. Do not begin these exercises until told by your health care provider. Stretching exercises External rotation and abduction This exercise is sometimes called corner stretch. This exercise rotates your arm outward (external rotation) and moves your arm out from your body (abduction). Stand in a doorway with one of your feet slightly in front of the other. This is called a staggered stance. If you cannot reach your forearms to the door frame, stand facing a corner of a room. Choose one of the following positions as told by your health care provider: Place your  hands and forearms on the door frame above your head. Place your hands and forearms on the door frame at the height of your head. Place your hands on the door frame at the height of your elbows. Slowly move your weight onto your front foot until you feel a stretch across your chest and in the front of your shoulders. Keep your head and chest upright and keep your abdominal muscles tight. Hold for __________ seconds. To release the stretch, shift your weight to your back foot. Repeat __________ times. Complete this exercise __________ times a day. Extension, standing Stand and hold a broomstick, a cane, or a similar object behind your back. Your hands should be a little wider than shoulder width apart. Your palms should face away from your back. Keeping your elbows straight and your shoulder muscles relaxed, move the stick away from your body until you feel a stretch in your shoulders (extension). Avoid shrugging your shoulders while you move the stick. Keep your shoulder blades tucked down toward the middle of your back. Hold for __________ seconds. Slowly return to the  starting position. Repeat __________ times. Complete this exercise __________ times a day. Range-of-motion exercises Pendulum  Stand near a wall or a surface that you can hold onto for balance. Bend at the waist and let your left / right arm hang straight down. Use your other arm to support you. Keep your back straight and do not lock your knees. Relax your left / right arm and shoulder muscles, and move your hips and your trunk so your left / right arm swings freely. Your arm should swing because of the motion of your body, not because you are using your arm or shoulder muscles. Keep moving your hips and trunk so your arm swings in the following directions, as told by your health care provider: Side to side. Forward and backward. In clockwise and counterclockwise circles. Continue each motion for __________ seconds, or for  as long as told by your health care provider. Slowly return to the starting position. Repeat __________ times. Complete this exercise __________ times a day. Shoulder flexion, standing  Stand and hold a broomstick, a cane, or a similar object. Place your hands a little more than shoulder width apart on the object. Your left / right hand should be palm up, and your other hand should be palm down. Keep your elbow straight and your shoulder muscles relaxed. Push the stick up with your healthy arm to raise your left / right arm in front of your body, and then over your head until you feel a stretch in your shoulder (flexion). Avoid shrugging your shoulder while you raise your arm. Keep your shoulder blade tucked down toward the middle of your back. Hold for __________ seconds. Slowly return to the starting position. Repeat __________ times. Complete this exercise __________ times a day. Shoulder abduction, standing Stand and hold a broomstick, a cane, or a similar object. Place your hands a little more than shoulder width apart on the object. Your left / right hand should be palm up, and your other hand should be palm down. Keep your elbow straight and your shoulder muscles relaxed. Push the object across your body toward your left / right side. Raise your left / right arm to the side of your body (abduction) until you feel a stretch in your shoulder. Do not raise your arm above shoulder height unless your health care provider tells you to do that. If directed, raise your arm over your head. Avoid shrugging your shoulder while you raise your arm. Keep your shoulder blade tucked down toward the middle of your back. Hold for __________ seconds. Slowly return to the starting position. Repeat __________ times. Complete this exercise __________ times a day. Internal rotation  Place your left / right hand behind your back, palm up. Use your other hand to dangle an exercise band, a towel, or a similar  object over your shoulder. Grasp the band with your left / right hand so you are holding on to both ends. Gently pull up on the band until you feel a stretch in the front of your left / right shoulder. The movement of your arm toward the center of your body is called internal rotation. Avoid shrugging your shoulder while you raise your arm. Keep your shoulder blade tucked down toward the middle of your back. Hold for __________ seconds. Release the stretch by letting go of the band and lowering your hands. Repeat __________ times. Complete this exercise __________ times a day. Strengthening exercises External rotation  Sit in a stable chair without armrests. Secure  an exercise band to a stable object at elbow height on your left / right side. Place a soft object, such as a folded towel or a small pillow, between your left / right upper arm and your body to move your elbow about 4 inches (10 cm) away from your side. Hold the end of the exercise band so it is tight and there is no slack. Keeping your elbow pressed against the soft object, slowly move your forearm out, away from your abdomen (external rotation). Keep your body steady so only your forearm moves. Hold for __________ seconds. Slowly return to the starting position. Repeat __________ times. Complete this exercise __________ times a day. Shoulder abduction  Sit in a stable chair without armrests, or stand up. Hold a __________ weight in your left / right hand, or hold an exercise band with both hands. Start with your arms straight down and your left / right palm facing in, toward your body. Slowly lift your left / right hand out to your side (abduction). Do not lift your hand above shoulder height unless your health care provider tells you that this is safe. Keep your arms straight. Avoid shrugging your shoulder while you do this movement. Keep your shoulder blade tucked down toward the middle of your back. Hold for __________  seconds. Slowly lower your arm, and return to the starting position. Repeat __________ times. Complete this exercise __________ times a day. Shoulder extension Sit in a stable chair without armrests, or stand up. Secure an exercise band to a stable object in front of you so it is at shoulder height. Hold one end of the exercise band in each hand. Your palms should face each other. Straighten your elbows and lift your hands up to shoulder height. Step back, away from the secured end of the exercise band, until the band is tight and there is no slack. Squeeze your shoulder blades together as you pull your hands down to the sides of your thighs (extension). Stop when your hands are straight down by your sides. Do not let your hands go behind your body. Hold for __________ seconds. Slowly return to the starting position. Repeat __________ times. Complete this exercise __________ times a day. Shoulder row Sit in a stable chair without armrests, or stand up. Secure an exercise band to a stable object in front of you so it is at waist height. Hold one end of the exercise band in each hand. Position your palms so that your thumbs are facing the ceiling (neutral position). Bend each of your elbows to a 90-degree angle (right angle) and keep your upper arms at your sides. Step back until the band is tight and there is no slack. Slowly pull your elbows back behind you. Hold for __________ seconds. Slowly return to the starting position. Repeat __________ times. Complete this exercise __________ times a day. Shoulder press-ups  Sit in a stable chair that has armrests. Sit upright, with your feet flat on the floor. Put your hands on the armrests so your elbows are bent and your fingers are pointing forward. Your hands should be about even with the sides of your body. Push down on the armrests and use your arms to lift yourself off the chair. Straighten your elbows and lift yourself up as much as you  comfortably can. Move your shoulder blades down, and avoid letting your shoulders move up toward your ears. Keep your feet on the ground. As you get stronger, your feet should support less of  your body weight as you lift yourself up. Hold for __________ seconds. Slowly lower yourself back into the chair. Repeat __________ times. Complete this exercise __________ times a day. Wall push-ups  Stand so you are facing a stable wall. Your feet should be about one arm-length away from the wall. Lean forward and place your palms on the wall at shoulder height. Keep your feet flat on the floor as you bend your elbows and lean forward toward the wall. Hold for __________ seconds. Straighten your elbows to push yourself back to the starting position. Repeat __________ times. Complete this exercise __________ times a day. This information is not intended to replace advice given to you by your health care provider. Make sure you discuss any questions you have with your health care provider. Document Revised: 12/08/2018 Document Reviewed: 09/15/2018 Elsevier Patient Education  2022 [employer].

## 2021-08-03 ENCOUNTER — Other Ambulatory Visit: Payer: Self-pay

## 2021-08-03 ENCOUNTER — Ambulatory Visit: Payer: Medicare PPO | Admitting: Cardiology

## 2021-08-03 ENCOUNTER — Encounter: Payer: Self-pay | Admitting: Cardiology

## 2021-08-03 VITALS — BP 157/67 | HR 78 | Temp 97.9°F | Resp 17 | Ht 64.5 in | Wt 174.2 lb

## 2021-08-03 DIAGNOSIS — I10 Essential (primary) hypertension: Secondary | ICD-10-CM | POA: Diagnosis not present

## 2021-08-03 DIAGNOSIS — R0989 Other specified symptoms and signs involving the circulatory and respiratory systems: Secondary | ICD-10-CM | POA: Diagnosis not present

## 2021-08-03 DIAGNOSIS — I351 Nonrheumatic aortic (valve) insufficiency: Secondary | ICD-10-CM | POA: Diagnosis not present

## 2021-08-03 LAB — CBC WITH DIFFERENTIAL/PLATELET
Absolute Monocytes: 546 cells/uL (ref 200–950)
Basophils Absolute: 28 cells/uL (ref 0–200)
Basophils Relative: 0.4 %
Eosinophils Absolute: 217 cells/uL (ref 15–500)
Eosinophils Relative: 3.1 %
HCT: 39 % (ref 35.0–45.0)
Hemoglobin: 12.5 g/dL (ref 11.7–15.5)
Lymphs Abs: 2086 cells/uL (ref 850–3900)
MCH: 27.1 pg (ref 27.0–33.0)
MCHC: 32.1 g/dL (ref 32.0–36.0)
MCV: 84.4 fL (ref 80.0–100.0)
MPV: 10.8 fL (ref 7.5–12.5)
Monocytes Relative: 7.8 %
Neutro Abs: 4123 cells/uL (ref 1500–7800)
Neutrophils Relative %: 58.9 %
Platelets: 292 10*3/uL (ref 140–400)
RBC: 4.62 10*6/uL (ref 3.80–5.10)
RDW: 12.9 % (ref 11.0–15.0)
Total Lymphocyte: 29.8 %
WBC: 7 10*3/uL (ref 3.8–10.8)

## 2021-08-03 LAB — SJOGRENS SYNDROME-A EXTRACTABLE NUCLEAR ANTIBODY: SSA (Ro) (ENA) Antibody, IgG: >8 AI — AB

## 2021-08-03 LAB — COMPLETE METABOLIC PANEL WITH GFR
AG Ratio: 1.2 (calc) (ref 1.0–2.5)
ALT: 9 U/L (ref 6–29)
AST: 15 U/L (ref 10–35)
Albumin: 4.2 g/dL (ref 3.6–5.1)
Alkaline phosphatase (APISO): 88 U/L (ref 37–153)
BUN: 22 mg/dL (ref 7–25)
CO2: 27 mmol/L (ref 20–32)
Calcium: 9.6 mg/dL (ref 8.6–10.4)
Chloride: 102 mmol/L (ref 98–110)
Creat: 0.89 mg/dL (ref 0.60–0.95)
Globulin: 3.5 g/dL (calc) (ref 1.9–3.7)
Glucose, Bld: 78 mg/dL (ref 65–99)
Potassium: 4.4 mmol/L (ref 3.5–5.3)
Sodium: 137 mmol/L (ref 135–146)
Total Bilirubin: 0.3 mg/dL (ref 0.2–1.2)
Total Protein: 7.7 g/dL (ref 6.1–8.1)
eGFR: 65 mL/min/{1.73_m2} (ref >=60)

## 2021-08-03 LAB — ANTI-SMITH ANTIBODY: ENA SM Ab Ser-aCnc: <1 AI

## 2021-08-03 LAB — C3 AND C4
C3 Complement: 133 mg/dL (ref 83–193)
C4 Complement: 29 mg/dL (ref 15–57)

## 2021-08-03 LAB — VITAMIN D 25 HYDROXY (VIT D DEFICIENCY, FRACTURES): Vit D, 25-Hydroxy: 47 ng/mL (ref 30–100)

## 2021-08-03 LAB — ANTI-DNA ANTIBODY, DOUBLE-STRANDED: ds DNA Ab: 3 IU/mL

## 2021-08-03 LAB — PROTEIN / CREATININE RATIO, URINE
Creatinine, Urine: 66 mg/dL (ref 20–275)
Protein/Creat Ratio: 121 mg/g creat (ref 24–184)
Protein/Creatinine Ratio: 0.121 mg/mg creat (ref 0.024–0.184)
Total Protein, Urine: 8 mg/dL (ref 5–24)

## 2021-08-03 LAB — RNP ANTIBODY: Ribonucleic Protein(ENA) Antibody, IgG: <1 AI

## 2021-08-03 LAB — SEDIMENTATION RATE: Sed Rate: 17 mm/h (ref 0–30)

## 2021-08-03 MED ORDER — NIFEDIPINE ER OSMOTIC RELEASE 30 MG PO TB24: 30.0000 mg | ORAL_TABLET | Freq: Every day | ORAL | 0 refills | Status: DC

## 2021-08-03 MED ORDER — NIFEDIPINE ER 90 MG PO TB24
90.0000 mg | ORAL_TABLET | Freq: Every day | ORAL | 3 refills | Status: DC
Start: 2021-08-03 — End: 2022-07-13

## 2021-08-03 NOTE — Progress Notes (Signed)
Primary Physician:  Glendale Chard, MD   Patient ID: Jessica Booker, female    DOB: 09/03/1940, 80 y.o.   MRN: [id number]  Subjective:    Chief Complaint  Patient presents with   Moderate mitral regurgitation   PRIMARY HYPERTENSION    1 YEAR     HPI: Jessica Booker  is a 80 y.o. 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  This is annual visit, she is presently doing well and denies any chest pain or dyspnea.  She lost her husband 2 months ago.  She is still grieving.  No other specific complaints today.  Past Medical History:  Diagnosis Date   Asthma    Hypertension    Lupus ([place])     Past Surgical History:  Procedure Laterality Date   ABDOMINAL HYSTERECTOMY     CHOLECYSTECTOMY     SHOULDER ARTHROSCOPY W/ ROTATOR CUFF REPAIR     R shoulder   TONSILLECTOMY     Social History   Tobacco Use   Smoking status: Never   Smokeless tobacco: Never  Substance Use Topics   Alcohol use: No    Review of Systems  Cardiovascular:  Negative for chest pain, dyspnea on exertion and leg swelling.  Musculoskeletal:  Positive for back pain, joint pain and neck pain.  Objective:  Blood pressure (!) 157/67, pulse 78, temperature 97.9 F (36.6 C), temperature source Temporal, resp. rate 17, height 5' 4.5" (1.638 m), weight 174 lb 3.2 oz (79 kg), SpO2 100 %. Body mass index is 29.44 kg/m.  Vitals with BMI 08/03/2021 07/31/2021 05/27/2021  Height 5' 4.5" 5' 4.5" 5' 4.5"  Weight 174 lbs 3 oz 175 lbs 6 oz 172 lbs 6 oz  BMI 29.45 [id number]  Systolic [id number]  Diastolic 67 76 70  Pulse 78 48 68      Physical Exam Vitals reviewed.  Constitutional:      Appearance: She is well-developed.  Neck:     Vascular: Carotid bruit (bilateral) present. No JVD.  Cardiovascular:     Rate and  Rhythm: Normal rate and regular rhythm.     Pulses: Normal pulses and intact distal pulses.     Heart sounds: Murmur heard.  Crescendo-decrescendo mid to late systolic murmur is present with a grade of 3/6 at the apex.  Harsh early systolic murmur of grade 2/6 is also present at the upper right sternal border.  Pulmonary:     Effort: Pulmonary effort is normal. No accessory muscle usage or respiratory distress.     Breath sounds: Normal breath sounds.  Abdominal:     General: Bowel sounds are normal.     Palpations: Abdomen is soft.  Musculoskeletal:     Right lower leg: No edema.     Left lower leg: No edema.  Radiology: No results found.  Laboratory examination:    CMP Latest Ref Rng & Units 07/31/2021 05/27/2021 11/20/2020  Glucose 65 - 99 mg/dL 78 91 79  BUN 7 - 25 mg/dL 22 15 18   Creatinine 0.60 - 0.95 mg/dL 0.89 0.87 0.90  Sodium 135 - 146 mmol/L 137 141 138  Potassium 3.5 - 5.3 mmol/L 4.4 4.4 3.9  Chloride 98 - 110 mmol/L 102 104 100  CO2 20 - 32 mmol/L 27 25 24   Calcium 8.6 - 10.4 mg/dL 9.6  10.0 9.5  Total Protein 6.1 - 8.1 g/dL 7.7 7.7 7.6  Total Bilirubin 0.2 - 1.2 mg/dL 0.3 0.4 0.4  Alkaline Phos 44 - 121 IU/L - 95 106  AST 10 - 35 U/L 15 15 20   ALT 6 - 29 U/L 9 15 10    CBC Latest Ref Rng & Units 07/31/2021 11/20/2020 06/27/2020  WBC 3.8 - 10.8 Thousand/uL 7.0 7.2 6.1  Hemoglobin 11.7 - 15.5 g/dL 12.5 11.7 11.5(L)  Hematocrit 35.0 - 45.0 % 39.0 36.3 36.3  Platelets 140 - 400 Thousand/uL 292 262 300   Lipid Panel Recent Labs    11/20/20 1545  CHOL 162  TRIG 34  LDLCALC 87  HDL 67  CHOLHDL 2.4     HEMOGLOBIN A1C No results found for: HGBA1C, MPG TSH No results for input(s): TSH in the last 8760 hours.   PRN Meds:. Medications Discontinued During This Encounter  Medication Reason   amoxicillin (AMOXIL) 500 MG capsule    baclofen (LIORESAL) 10 MG tablet    NIFEdipine (ADALAT CC) 60 MG 24 hr tablet Reorder   Current Outpatient Medications on File  Prior to Visit  Medication Sig Dispense Refill   albuterol (VENTOLIN HFA) 108 (90 Base) MCG/ACT inhaler TAKE 2 PUFFS BY MOUTH EVERY 6 HOURS AS NEEDED FOR WHEEZE OR SHORTNESS OF BREATH 18 each 1   aspirin 81 MG tablet Take 81 mg by mouth daily. Takes as needed     clotrimazole (LOTRIMIN) 1 % external solution Apply 1 application topically 2 (two) times daily. In between toes 60 mL 5   diclofenac sodium (VOLTAREN) 1 % GEL Apply 2 g to 4 g to affected joint up to 4 times daily PRN. 4 Tube 2   hydroxychloroquine (PLAQUENIL) 200 MG tablet TAKE 1 TABLET BY MOUTH TWICE DAILY MONDAY THROUGH FRIDAY ONLY 120 tablet 0   linaclotide (LINZESS) 72 MCG capsule Take 1 capsule (72 mcg total) by mouth daily before breakfast. (Patient taking differently: Take 72 mcg by mouth as needed.) 30 capsule 1   loratadine (CLARITIN) 10 MG tablet TAKE 1 TABLET BY MOUTH EVERY DAY (Patient taking differently: Take 10 mg by mouth daily as needed.) 30 tablet 1   valsartan-hydrochlorothiazide (DIOVAN-HCT) 320-12.5 MG tablet TAKE 1 TABLET BY MOUTH EVERY DAY IN THE MORNING 90 tablet 1   No current facility-administered medications on file prior to visit.    Cardiac Studies:   Exercise myoview stress 12/02/2017: 1. The patient performed treadmill exercise using a Bruce protocol, completing 5 minutes. The patient completed an estimated workload of 7.03 METS, reaching 97% of the maximum predicted heart rate. Resting hypertension with exaggerated exercise response. Peak BP 248/48 mmHg. No stress symptoms reported. Fair exercise capacity.   No ischemic changes seen on stress electrocardiogram. Frequent PVC's seen at rest, and during exercise. 2. The overall quality of the study is excellent. There is no evidence of abnormal lung activity. Stress and rest SPECT images demonstrate homogeneous tracer distribution throughout the myocardium. Gated SPECT imaging reveals normal myocardial thickening and wall motion. The left ventricular ejection  fraction was normal (72%).   3. Low risk study.  Carotid artery duplex 12/13/2017: No hemodynamically significant arterial disease in the internal carotid artery bilaterally. There is heterogeneous plaque noted in the right external carotid and carotid bifurcations with less than 50%( minimal) stenosis.  Further studies if clinically indicated. No significant change from 07/03/2014.  PCV ECHOCARDIOGRAM COMPLETE 06/05/2020  Narrative Echocardiogram 06/05/2020: Normal LV systolic function with visual EF 60-65%. Left ventricle  cavity is normal in size. Mild left ventricular hypertrophy. Normal global wall motion. Indeterminate diastolic filling pattern, elevated LAP. Mild to moderate aortic regurgitation. Aortic valve sclerosis without stenosis. Mild (Grade I) mitral regurgitation. Mild tricuspid regurgitation. Mild pulmonic valve stenosis. Compared to prior study dated 05/09/2019 no significant change.  EKG:  EKG 11/20/2020: Marked sinus bradycardia heart rate of 50 bpm, otherwise normal EKG. COMPARED TO EKG 05/29/2020: Normal sinus rhythm at rate of 60 bpm, left atrial enlargement, left axis deviation.     Assessment:     ICD-10-CM   1. Moderate aortic regurgitation  I35.1 NIFEdipine (ADALAT CC) 90 MG 24 hr tablet    NIFEdipine (PROCARDIA XL) 30 MG 24 hr tablet    2. Primary hypertension  I10 EKG 12-Lead    NIFEdipine (ADALAT CC) 90 MG 24 hr tablet    NIFEdipine (PROCARDIA XL) 30 MG 24 hr tablet    CANCELED: EKG 12-Lead    3. Bilateral carotid bruits  R09.89 PCV CAROTID DUPLEX (BILATERAL)      Meds ordered this encounter  Medications   NIFEdipine (ADALAT CC) 90 MG 24 hr tablet    Sig: Take 1 tablet (90 mg total) by mouth daily.    Dispense:  100 tablet    Refill:  3   NIFEdipine (PROCARDIA XL) 30 MG 24 hr tablet    Sig: Take 1 tablet (30 mg total) by mouth daily.    Dispense:  90 tablet    Refill:  0    She has almost 90 day Rx of 60 mg tablets. I also sent for 90 mg Rx      Medications Discontinued During This Encounter  Medication Reason   amoxicillin (AMOXIL) 500 MG capsule    baclofen (LIORESAL) 10 MG tablet    NIFEdipine (ADALAT CC) 60 MG 24 hr tablet Reorder    Recommendations:   CATHARINE KETTLEWELL  is a 80 y.o. with  lupus arthritis, hypertension, mild obesity, hyperglycemia , exercise nuclear stress test in April 2019 that was considered low risk study, moderate aortic regurgitation and MVP with mild to moderate mitral regurgitation.  This is annual visit, she is presently doing well and denies any chest pain or dyspnea.  No significant change in her physical exam.  Her carotid bruit on the left side appears to be much more prominent.  I will repeat carotid artery duplex.  Blood pressure is elevated today, will increase Procardia XL from 60 mg to 90 mg daily.  She will continue to monitor her blood pressure closely and she will call us if systolic blood pressure is >130 235 mmHg.  No clinical evidence of heart failure.  Aortic regurgitation murmur and mitral regurgitation murmur appears to be very stable.  I will see her back in a year or sooner if problems.  I reviewed external labs, lipids under excellent control as well.      Yates Decamp, MD, [facility] 08/03/2021, 3:31 PM Office: [phone] Pager: [phone]

## 2021-08-04 ENCOUNTER — Encounter: Payer: Self-pay | Admitting: Internal Medicine

## 2021-08-04 ENCOUNTER — Ambulatory Visit: Payer: Medicare PPO | Admitting: Internal Medicine

## 2021-08-04 VITALS — BP 128/74 | HR 55 | Temp 98.1°F | Ht 64.5 in | Wt 173.4 lb

## 2021-08-04 DIAGNOSIS — I10 Essential (primary) hypertension: Secondary | ICD-10-CM | POA: Diagnosis not present

## 2021-08-04 DIAGNOSIS — R252 Cramp and spasm: Secondary | ICD-10-CM | POA: Diagnosis not present

## 2021-08-04 DIAGNOSIS — R0989 Other specified symptoms and signs involving the circulatory and respiratory systems: Secondary | ICD-10-CM

## 2021-08-04 NOTE — Progress Notes (Signed)
cv I,Katawbba Wiggins,acting as a Neurosurgeon for Gwynneth Aliment, MD.,have documented all relevant documentation on the behalf of Gwynneth Aliment, MD,as directed by  Gwynneth Aliment, MD while in the presence of Gwynneth Aliment, MD.  This visit occurred during the SARS-CoV-2 public health emergency.  Safety protocols were in place, including screening questions prior to the visit, additional usage of staff PPE, and extensive cleaning of exam room while observing appropriate contact time as indicated for disinfecting solutions.  Subjective:     Patient ID: Jessica Booker , female    DOB: 01-03-1941 , 80 y.o.   MRN: [id number]   Chief Complaint  Patient presents with   Discuss PAD results    HPI  The patient is here today to discuss her PAD results. She had a home health provider come to her home, PAD screening performed during this visit. She was found to have decreased reading on the right, 0.51. She denies having leg pain when walking; however, does admit to frequent leg cramps.     Past Medical History:  Diagnosis Date   Asthma    Hypertension    Lupus (HCC)      Family History  Problem Relation Age of Onset   Heart attack Mother    Stomach cancer Father    Hypertension Sister    Heart disease Sister      Current Outpatient Medications:    albuterol (VENTOLIN HFA) 108 (90 Base) MCG/ACT inhaler, TAKE 2 PUFFS BY MOUTH EVERY 6 HOURS AS NEEDED FOR WHEEZE OR SHORTNESS OF BREATH, Disp: 18 each, Rfl: 1   aspirin 81 MG tablet, Take 81 mg by mouth daily. Takes as needed, Disp: , Rfl:    clotrimazole (LOTRIMIN) 1 % external solution, Apply 1 application topically 2 (two) times daily. In between toes, Disp: 60 mL, Rfl: 5   diclofenac sodium (VOLTAREN) 1 % GEL, Apply 2 g to 4 g to affected joint up to 4 times daily PRN., Disp: 4 Tube, Rfl: 2   hydroxychloroquine (PLAQUENIL) 200 MG tablet, TAKE 1 TABLET BY MOUTH TWICE DAILY MONDAY THROUGH FRIDAY ONLY, Disp: 120 tablet, Rfl: 0   linaclotide  (LINZESS) 72 MCG capsule, Take 1 capsule (72 mcg total) by mouth daily before breakfast. (Patient taking differently: Take 72 mcg by mouth as needed.), Disp: 30 capsule, Rfl: 1   loratadine (CLARITIN) 10 MG tablet, TAKE 1 TABLET BY MOUTH EVERY DAY (Patient taking differently: Take 10 mg by mouth daily as needed.), Disp: 30 tablet, Rfl: 1   NIFEdipine (ADALAT CC) 90 MG 24 hr tablet, Take 1 tablet (90 mg total) by mouth daily., Disp: 100 tablet, Rfl: 3   valsartan-hydrochlorothiazide (DIOVAN-HCT) 320-12.5 MG tablet, TAKE 1 TABLET BY MOUTH EVERY DAY IN THE MORNING, Disp: 90 tablet, Rfl: 1   NIFEdipine (PROCARDIA XL) 30 MG 24 hr tablet, Take 1 tablet (30 mg total) by mouth daily. (Patient not taking: Reported on 08/04/2021), Disp: 90 tablet, Rfl: 0   No Known Allergies   Review of Systems  Constitutional: Negative.   Respiratory: Negative.    Cardiovascular: Negative.   Gastrointestinal: Negative.   Neurological: Negative.   Psychiatric/Behavioral: Negative.      Today's Vitals   08/04/21 1456  BP: 128/74  Pulse: (!) 55  Temp: 98.1 F (36.7 C)  Weight: 173 lb 6.4 oz (78.7 kg)  Height: 5' 4.5" (1.638 m)  PainSc: 0-No pain   Body mass index is 29.3 kg/m.  Wt Readings from Last 3 Encounters:  08/04/21 173 lb 6.4 oz (78.7 kg)  08/03/21 174 lb 3.2 oz (79 kg)  07/31/21 175 lb 6.4 oz (79.6 kg)    BP Readings from Last 3 Encounters:  08/04/21 128/74  08/03/21 (!) 157/67  07/31/21 (!) 149/76    Objective:  Physical Exam Vitals and nursing note reviewed.  Constitutional:      Appearance: Normal appearance.  HENT:     Head: Normocephalic and atraumatic.     Nose:     Comments: Masked     Mouth/Throat:     Comments: Masked  Eyes:     Extraocular Movements: Extraocular movements intact.  Cardiovascular:     Rate and Rhythm: Normal rate and regular rhythm.     Heart sounds: Murmur heard.  Pulmonary:     Effort: Pulmonary effort is normal.     Breath sounds: Normal breath sounds.   Musculoskeletal:     Cervical back: Normal range of motion.  Skin:    General: Skin is warm.  Neurological:     General: No focal deficit present.     Mental Status: She is alert.  Psychiatric:        Mood and Affect: Mood normal.        Behavior: Behavior normal.        Assessment And Plan:     1. Leg cramps Comments: Also w/ decreased DP pulses. She agrees to ABI testing.  - VAS [place] ABI WITH/WO TBI; Future  2. Essential hypertension Comments: Chronic, well controlled. Encouraged to follow low sodium diet. NO med changes. She will f/u in 3-4 months for re-evaluation.   3. Bilateral carotid bruits Comments: She is scheduled for carotid u/s with Cardiology. ADvised she may need statin erhapy, previous lipids reviewed.     Patient was given opportunity to ask questions. Patient verbalized understanding of the plan and was able to repeat key elements of the plan. All questions were answered to their satisfaction.   I, Gwynneth Aliment, MD, have reviewed all documentation for this visit. The documentation on 08/04/21 for the exam, diagnosis, procedures, and orders are all accurate and complete.   IF YOU HAVE BEEN REFERRED TO A SPECIALIST, IT MAY TAKE 1-2 WEEKS TO SCHEDULE/PROCESS THE REFERRAL. IF YOU HAVE NOT HEARD FROM US/SPECIALIST IN TWO WEEKS, PLEASE GIVE [place] A CALL AT [phone].   THE PATIENT IS ENCOURAGED TO PRACTICE SOCIAL DISTANCING DUE TO THE COVID-19 PANDEMIC.

## 2021-08-04 NOTE — Patient Instructions (Signed)
Peripheral Vascular Disease ?Peripheral vascular disease (PVD) is a disease of the blood vessels that carry blood from the heart to the rest of the body. PVD is also called peripheral artery disease (PAD) or poor circulation. PVD affects most of the body. But it affects the legs and feet the most. ?PVD can lead to acute limb ischemia. This happens when there is a sudden stop of blood flow to an arm or leg. This is a medical emergency. ?What are the causes? ?The most common cause of PVD is a buildup of a fatty substance (plaque) inside your arteries. This decreases blood flow. Plaque can break off and block blood in a smaller artery. This can lead to acute limb ischemia. ?Other common causes of PVD include: ?Blood clots inside the blood vessels. ?Injuries to blood vessels. ?Irritation and swelling of blood vessels. ?Sudden tightening of the blood vessel (spasms). ?What increases the risk? ?A family history of PVD. ?Medical conditions, including: ?High cholesterol. ?Diabetes. ?High blood pressure. ?Heart disease. ?Past problems with blood clots. ?Past injury, such as burns or a broken bone. ?Other conditions, such as: ?Buerger's disease. This is caused by swollen or irritated blood vessels in your hands and feet. ?Arthritis. ?Birth defects that affect the arteries in your legs. ?Kidney disease. ?Using tobacco or nicotine products. ?Not getting enough exercise. ?Being very overweight (obese). ?Being 50 years old or older. ?What are the signs or symptoms? ?Cramps in your butt, legs, and feet. ?Pain and weakness in your legs when you are active that goes away when you rest. ?Leg pain when at rest. ?Leg numbness, tingling, or weakness. ?Coldness in a leg or foot, especially when compared with the other leg or foot. ?Skin or hair changes. These can include: ?Hair loss. ?Shiny skin. ?Pale or bluish skin. ?Thick toenails. ?Being unable to get or keep an erection. ?Tiredness (fatigue). ?Weak pulse or no pulse in the  feet. ?Wounds and sores on the toes, feet, or legs. These take longer to heal. ?How is this treated? ?Underlying causes are treated first. Other conditions, like diabetes, high cholesterol, and blood pressure, are also treated. Treatment may include: ?Lifestyle changes, such as: ?Quitting smoking. ?Getting regular exercise. ?Having a diet low in fat and cholesterol. ?Not drinking alcohol. ?Taking medicines, such as: ?Blood thinners. ?Medicines to improve blood flow. ?Medicines to improve your blood cholesterol. ?Procedures to: ?Open the arteries and restore blood flow. ?Insert a small mesh tube (stent) to keep a blocked vessel open. ?Create a new path for blood to flow to the body (peripheral bypass). ?Remove dead tissue from a wound. ?Remove an affected leg or arm. ?Follow these instructions at home: ?Medicines ?Take over-the-counter and prescription medicines only as told by your doctor. ?If you are taking blood thinners: ?Talk with your doctor before you take any medicines that have aspirin, or NSAIDs, such as ibuprofen. ?Take medicines exactly as told. Take them at the same time each day. ?Avoid doing things that could hurt or bruise you. Take action to prevent falls. ?Wear an alert bracelet or carry a card that shows you are taking blood thinners. ?Lifestyle ?  ?Get regular exercise. Ask your doctor about how to stay active. ?Talk with your doctor about keeping a healthy weight. If needed, ask about losing weight. ?Eat a diet that is low in fat and cholesterol. If you need help, talk with your doctor. ?Do not drink alcohol. ?Do not smoke or use any products that contain nicotine or tobacco. If you need help quitting, ask your   doctor. ?General instructions ?Take good care of your feet. To do this: ?Wear shoes that fit well and feel good. ?Check your feet often for any cuts or sores. ?Get a flu shot (influenza vaccine) each year. ?Keep all follow-up visits. ?Where to find more information ?Society for Vascular  Surgery: vascular.org ?American Heart Association: heart.org ?National Heart, Lung, and Blood Institute: nhlbi.nih.gov ?Contact a doctor if: ?You have cramps in your legs when you walk. ?You have leg pain when you rest. ?Your leg or foot feels cold. ?Your skin changes. ?You cannot get or keep an erection. ?You have cuts or sores on your legs or feet that do not heal. ?Get help right away if: ?You have sudden changes in the color and feeling of your arms or legs, such as: ?Your arm or leg turns cold, numb, and blue. ?Your arm or leg becomes red, warm, swollen, painful, or numb. ?You have any signs of a stroke. "BE FAST" is an easy way to remember the main warning signs: ?B - Balance. Dizziness, sudden trouble walking, or loss of balance. ?E - Eyes. Trouble seeing or a change in how you see. ?F - Face. Sudden weakness or loss of feeling of the face. The face or eyelid may droop on one side. ?A - Arms. Weakness or loss of feeling in an arm. This happens all of a sudden and most often on one side of the body. ?S - Speech. Sudden trouble speaking, slurred speech, or trouble understanding what people say. ?T - Time. Time to call emergency services. Write down what time symptoms started. ?You have other signs of a stroke, such as: ?A sudden, very bad headache with no known cause. ?Feeling like you may vomit (nausea). ?Vomiting. ?A seizure. ?You have chest pain or trouble breathing. ?These symptoms may be an emergency. Get help right away. Call your local emergency services (911 in the [place] ?Do not wait to see if the symptoms will go away. ?Do not drive yourself to the hospital. ?Summary ?Peripheral vascular disease (PVD) is a disease of the blood vessels. ?PVD affects the legs and feet the most. ?Symptoms may include leg pain or leg numbness, tingling, and weakness. ?Treatment may include lifestyle changes, medicines, and procedures. ?This information is not intended to replace advice given to you by your health care  provider. Make sure you discuss any questions you have with your health care provider. ?Document Revised: 02/18/2020 Document Reviewed: 02/18/2020 ?Elsevier Patient Education ? 2022 Elsevier Inc. ? ?

## 2021-08-11 ENCOUNTER — Ambulatory Visit (HOSPITAL_COMMUNITY)
Admission: RE | Admit: 2021-08-11 | Discharge: 2021-08-11 | Disposition: A | Discharge status: Home / Self Care | Payer: Medicare PPO | Source: Ambulatory Visit | Attending: Internal Medicine | Admitting: Internal Medicine

## 2021-08-11 ENCOUNTER — Other Ambulatory Visit: Payer: Self-pay

## 2021-08-11 DIAGNOSIS — R252 Cramp and spasm: Secondary | ICD-10-CM | POA: Diagnosis not present

## 2021-08-13 ENCOUNTER — Ambulatory Visit: Payer: Medicare PPO

## 2021-08-13 ENCOUNTER — Other Ambulatory Visit: Payer: Self-pay

## 2021-08-13 DIAGNOSIS — R0989 Other specified symptoms and signs involving the circulatory and respiratory systems: Secondary | ICD-10-CM | POA: Diagnosis not present

## 2021-08-19 NOTE — Progress Notes (Signed)
No significant disease in the carotids. All stable  Carotid artery duplex 08/13/2021: Duplex suggests stenosis in the right internal carotid artery (minimal). Duplex suggests stenosis in the right external carotid artery (>50%). Duplex suggests stenosis in the left internal carotid artery (minimal). Antegrade right vertebral artery flow. Antegrade left vertebral artery flow. No significant change from 12/14/2017. Follow up is appropriate if clinically indicated.

## 2021-08-20 NOTE — Progress Notes (Signed)
Called patient, left CAD results on VM

## 2021-08-21 ENCOUNTER — Other Ambulatory Visit: Payer: Self-pay | Admitting: Cardiology

## 2021-08-21 DIAGNOSIS — I10 Essential (primary) hypertension: Secondary | ICD-10-CM

## 2021-08-21 DIAGNOSIS — I340 Nonrheumatic mitral (valve) insufficiency: Secondary | ICD-10-CM

## 2021-08-25 NOTE — Progress Notes (Signed)
Called and spoke to pt, pt voiced understanding.

## 2021-09-25 ENCOUNTER — Telehealth: Payer: Self-pay | Admitting: Rheumatology

## 2021-09-25 NOTE — Telephone Encounter (Signed)
Spoke with patient and reviewed x-ray results from December. Patient states she will call if the discomfort gets worse or does not improve.

## 2021-09-25 NOTE — Telephone Encounter (Signed)
Patient called the office stating she had xrays done in December and would like to know the results of those because she has forgotten them. Patient states her shoulders are starting to give her issues again. Offered patient an appointment and she requested to speak with Dr. Estanislado Pandy.

## 2021-10-12 DIAGNOSIS — Z0289 Encounter for other administrative examinations: Secondary | ICD-10-CM

## 2021-10-19 ENCOUNTER — Telehealth: Payer: Self-pay

## 2021-10-19 NOTE — Telephone Encounter (Signed)
The pt was notified her flma (her son's) has been mailed, a copy was kept for the pt to pickup and for her chart.

## 2021-11-26 ENCOUNTER — Encounter: Payer: Self-pay | Admitting: Internal Medicine

## 2021-11-26 ENCOUNTER — Ambulatory Visit (INDEPENDENT_AMBULATORY_CARE_PROVIDER_SITE_OTHER): Payer: Medicare PPO | Admitting: Internal Medicine

## 2021-11-26 VITALS — BP 126/78 | HR 55 | Temp 98.5°F | Ht 61.0 in | Wt 182.6 lb

## 2021-11-26 DIAGNOSIS — Z6834 Body mass index (BMI) 34.0-34.9, adult: Secondary | ICD-10-CM | POA: Diagnosis not present

## 2021-11-26 DIAGNOSIS — G8929 Other chronic pain: Secondary | ICD-10-CM

## 2021-11-26 DIAGNOSIS — I10 Essential (primary) hypertension: Secondary | ICD-10-CM

## 2021-11-26 DIAGNOSIS — M3219 Other organ or system involvement in systemic lupus erythematosus: Secondary | ICD-10-CM

## 2021-11-26 DIAGNOSIS — E6609 Other obesity due to excess calories: Secondary | ICD-10-CM | POA: Diagnosis not present

## 2021-11-26 DIAGNOSIS — Z0000 Encounter for general adult medical examination without abnormal findings: Secondary | ICD-10-CM

## 2021-11-26 DIAGNOSIS — M25512 Pain in left shoulder: Secondary | ICD-10-CM

## 2021-11-26 LAB — POCT URINALYSIS DIPSTICK
Bilirubin, UA: NEGATIVE
Blood, UA: NEGATIVE
Glucose, UA: NEGATIVE
Ketones, UA: NEGATIVE
Leukocytes, UA: NEGATIVE
Nitrite, UA: NEGATIVE
Protein, UA: NEGATIVE
Spec Grav, UA: 1.015 (ref 1.010–1.025)
Urobilinogen, UA: 0.2 E.U./dL
pH, UA: 5.5 (ref 5.0–8.0)

## 2021-11-26 NOTE — Progress Notes (Signed)
?Rich Brave [place]ittleton,acting as a Education administrator for Maximino Greenland, MD.,have documented all relevant documentation on the behalf of Maximino Greenland, MD,as directed by  Maximino Greenland, MD while in the presence of Maximino Greenland, MD.  ?This visit occurred during the SARS-CoV-2 public health emergency.  Safety protocols were in place, including screening questions prior to the visit, additional usage of staff PPE, and extensive cleaning of exam room while observing appropriate contact time as indicated for disinfecting solutions. ? ?Subjective:  ?  ? Patient ID: Jessica Booker , female    DOB: June 27, 1941 , 81 y.o.   MRN: [id number] ? ? ?Chief Complaint  ?Patient presents with  ? Annual Exam  ? ? ?HPI ? ?She is here today for a full physical examination. She is no longer followed by GYN. She reports compliance with meds. She denies headaches, chest pain and shortness of breath.  ? ?Hypertension ?This is a chronic problem. The problem has been gradually improving since onset. The problem is controlled. Pertinent negatives include no chest pain, headaches or palpitations. The current treatment provides moderate improvement. Compliance problems include exercise.    ? ?Past Medical History:  ?Diagnosis Date  ? Asthma   ? Hypertension   ? Lupus ([place])   ?  ? ?Family History  ?Problem Relation Age of Onset  ? Heart attack Mother   ? Stomach cancer Father   ? Hypertension Sister   ? Heart disease Sister   ? ? ? ?Current Outpatient Medications:  ?  albuterol (VENTOLIN HFA) 108 (90 Base) MCG/ACT inhaler, TAKE 2 PUFFS BY MOUTH EVERY 6 HOURS AS NEEDED FOR WHEEZE OR SHORTNESS OF BREATH, Disp: 18 each, Rfl: 1 ?  aspirin 81 MG tablet, Take 81 mg by mouth daily. Takes as needed, Disp: , Rfl:  ?  diclofenac sodium (VOLTAREN) 1 % GEL, Apply 2 g to 4 g to affected joint up to 4 times daily PRN., Disp: 4 Tube, Rfl: 2 ?  hydroxychloroquine (PLAQUENIL) 200 MG tablet, TAKE 1 TABLET BY MOUTH TWICE DAILY MONDAY THROUGH FRIDAY ONLY, Disp: 120  tablet, Rfl: 0 ?  linaclotide (LINZESS) 72 MCG capsule, Take 1 capsule (72 mcg total) by mouth daily before breakfast. (Patient taking differently: Take 72 mcg by mouth as needed.), Disp: 30 capsule, Rfl: 1 ?  NIFEdipine (ADALAT CC) 90 MG 24 hr tablet, Take 1 tablet (90 mg total) by mouth daily., Disp: 100 tablet, Rfl: 3 ?  valsartan-hydrochlorothiazide (DIOVAN-HCT) 320-12.5 MG tablet, TAKE 1 TABLET BY MOUTH EVERY DAY IN THE MORNING, Disp: 90 tablet, Rfl: 1 ?  clotrimazole (LOTRIMIN) 1 % external solution, Apply 1 application topically 2 (two) times daily. In between toes (Patient not taking: Reported on 11/26/2021), Disp: 60 mL, Rfl: 5 ?  loratadine (CLARITIN) 10 MG tablet, TAKE 1 TABLET BY MOUTH EVERY DAY (Patient not taking: Reported on 11/26/2021), Disp: 30 tablet, Rfl: 1  ? ?No Known Allergies  ? ? ?The patient states she uses post menopausal status for birth control. Last LMP was No LMP recorded. Patient is postmenopausal.. Negative for Dysmenorrhea. Negative for: breast discharge, breast lump(s), breast pain and breast self exam. Associated symptoms include abnormal vaginal bleeding. Pertinent negatives include abnormal bleeding (hematology), anxiety, decreased libido, depression, difficulty falling sleep, dyspareunia, history of infertility, nocturia, sexual dysfunction, sleep disturbances, urinary incontinence, urinary urgency, vaginal discharge and vaginal itching. Diet regular.The patient states her exercise level is intermittent.    ? . The patient's tobacco use is:  ?Social History  ? ?  Tobacco Use  ?Smoking Status Never  ?Smokeless Tobacco Never  ?Marland Kitchen She has been exposed to passive smoke. The patient's alcohol use is:  ?Social History  ? ?Substance and Sexual Activity  ?Alcohol Use No  ? ?Review of Systems  ?Constitutional: Negative.   ?HENT: Negative.    ?Eyes: Negative.   ?Respiratory: Negative.    ?Cardiovascular: Negative.  Negative for chest pain and palpitations.  ?Gastrointestinal: Negative.    ?Endocrine: Negative.   ?Genitourinary: Negative.   ?Musculoskeletal:  Positive for arthralgias.  ?     She c/o left shoulder pain. Denies fall/trauma. States her sx started several months ago - not sure what triggered her sx. Now pain is waking her at night  ?Skin: Negative.   ?Allergic/Immunologic: Negative.   ?Neurological: Negative.  Negative for headaches.  ?Hematological: Negative.   ?Psychiatric/Behavioral: Negative.     ? ?Today's Vitals  ? 11/26/21 0856  ?BP: 126/78  ?Pulse: (!) 55  ?Temp: 98.5 ?F (36.9 ?C)  ?Weight: 182 lb 9.6 oz (82.8 kg)  ?Height: '5\' 1"'  (1.549 m)  ?PainSc: 0-No pain  ? ?Body mass index is 34.5 kg/m?.  ?Wt Readings from Last 3 Encounters:  ?11/26/21 182 lb 9.6 oz (82.8 kg)  ?08/04/21 173 lb 6.4 oz (78.7 kg)  ?08/03/21 174 lb 3.2 oz (79 kg)  ?  ? ?Objective:  ?Physical Exam ?Vitals and nursing note reviewed.  ?Constitutional:   ?   Appearance: Normal appearance.  ?HENT:  ?   Head: Normocephalic and atraumatic.  ?   Right Ear: Tympanic membrane, ear canal and external ear normal.  ?   Left Ear: Tympanic membrane, ear canal and external ear normal.  ?   Nose:  ?   Comments: Masked  ?   Mouth/Throat:  ?   Comments: Masked  ?Eyes:  ?   Extraocular Movements: Extraocular movements intact.  ?   Conjunctiva/sclera: Conjunctivae normal.  ?   Pupils: Pupils are equal, round, and reactive to light.  ?Cardiovascular:  ?   Rate and Rhythm: Normal rate and regular rhythm.  ?   Pulses: Normal pulses.  ?   Heart sounds: Normal heart sounds.  ?Pulmonary:  ?   Effort: Pulmonary effort is normal.  ?   Breath sounds: Normal breath sounds.  ?Chest:  ?Breasts: ?   Tanner Score is 5.  ?   Right: Normal.  ?   Left: Normal.  ?Abdominal:  ?   General: Abdomen is flat. Bowel sounds are normal.  ?   Palpations: Abdomen is soft.  ?Genitourinary: ?   Comments: deferred ?Musculoskeletal:     ?   General: Tenderness present. Normal range of motion.  ?   Cervical back: Normal range of motion and neck supple.  ?    Comments: Decreased ROM of left shoulder due to pain  ?Skin: ?   General: Skin is warm and dry.  ?Neurological:  ?   General: No focal deficit present.  ?   Mental Status: She is alert and oriented to person, place, and time.  ?Psychiatric:     ?   Mood and Affect: Mood normal.     ?   Behavior: Behavior normal.  ?   ?Assessment And Plan:  ?   ?1. Encounter for general adult medical examination w/o abnormal findings ?Comments: A full exam was performed. Importance of monthly self breast exams was discussed with the patient. PATIENT IS ADVISED TO GET 30-45 MINUTES REGULAR EXERCISE NO LESS THAN FOUR TO FIVE DAYS  PER WEEK - BOTH WEIGHTBEARING EXERCISES AND AEROBIC ARE RECOMMENDED.  PATIENT IS ADVISED TO FOLLOW A HEALTHY DIET WITH AT LEAST SIX FRUITS/VEGGIES PER DAY, DECREASE INTAKE OF RED MEAT, AND TO INCREASE FISH INTAKE TO TWO DAYS PER WEEK.  MEATS/FISH SHOULD NOT BE FRIED, BAKED OR BROILED IS PREFERABLE.  IT IS ALSO IMPORTANT TO CUT BACK ON YOUR SUGAR INTAKE. PLEASE AVOID ANYTHING WITH ADDED SUGAR, CORN SYRUP OR OTHER SWEETENERS. IF YOU MUST USE A SWEETENER, YOU CAN TRY STEVIA. IT IS ALSO IMPORTANT TO AVOID ARTIFICIALLY SWEETENERS AND DIET BEVERAGES. LASTLY, I SUGGEST WEARING SPF 50 SUNSCREEN ON EXPOSED PARTS AND ESPECIALLY WHEN IN THE DIRECT SUNLIGHT FOR AN EXTENDED PERIOD OF TIME.  PLEASE AVOID FAST FOOD RESTAURANTS AND INCREASE YOUR WATER INTAKE. ? ?- Lipid panel ?- Hemoglobin A1c ? ?2. Essential hypertension ?Comments: Chronic, well controlled. EKG performed, NSR w/o LAE, low voltage w/ rightward P axis and rotation. She is encouraged to follow low sodium diet. F/u in 6 mos. ?- POCT Urinalysis Dipstick (81002) ?- Microalbumin / Creatinine Urine Ratio ?- EKG 12-Lead ?- CMP14+EGFR ?- CBC with Diff ? ?3. Chronic left shoulder pain ?Comments: She agrees to Ortho referral for radiographic studies and further evaluation.  ?- Ambulatory referral to Orthopedic Surgery ? ?4. 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 She is encouraged to follow an anti-inflammatory diet, free of processed foods and refined sugars.  ? ?5. 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

## 2021-11-26 NOTE — Patient Instructions (Signed)

## 2021-11-27 LAB — CBC WITH DIFFERENTIAL/PLATELET
Basophils Absolute: 0 10*3/uL (ref 0.0–0.2)
Basos: 1 %
EOS (ABSOLUTE): 0.2 10*3/uL (ref 0.0–0.4)
Eos: 3 %
Hematocrit: 35.8 % (ref 34.0–46.6)
Hemoglobin: 11.7 g/dL (ref 11.1–15.9)
Immature Grans (Abs): 0 10*3/uL (ref 0.0–0.1)
Immature Granulocytes: 0 %
Lymphocytes Absolute: 2.4 10*3/uL (ref 0.7–3.1)
Lymphs: 37 %
MCH: 26.5 pg — ABNORMAL LOW (ref 26.6–33.0)
MCHC: 32.7 g/dL (ref 31.5–35.7)
MCV: 81 fL (ref 79–97)
Monocytes Absolute: 0.5 10*3/uL (ref 0.1–0.9)
Monocytes: 7 %
Neutrophils Absolute: 3.4 10*3/uL (ref 1.4–7.0)
Neutrophils: 52 %
Platelets: 294 10*3/uL (ref 150–450)
RBC: 4.42 x10E6/uL (ref 3.77–5.28)
RDW: 12.1 % (ref 11.7–15.4)
WBC: 6.6 10*3/uL (ref 3.4–10.8)

## 2021-11-27 LAB — CMP14+EGFR
ALT: 11 IU/L (ref 0–32)
AST: 15 IU/L (ref 0–40)
Albumin/Globulin Ratio: 1.2 (ref 1.2–2.2)
Albumin: 4.1 g/dL (ref 3.7–4.7)
Alkaline Phosphatase: 93 IU/L (ref 44–121)
BUN/Creatinine Ratio: 20 (ref 12–28)
BUN: 21 mg/dL (ref 8–27)
Bilirubin Total: <0.2 mg/dL (ref 0.0–1.2)
CO2: 22 mmol/L (ref 20–29)
Calcium: 9.5 mg/dL (ref 8.7–10.3)
Chloride: 106 mmol/L (ref 96–106)
Creatinine, Ser: 1.05 mg/dL — ABNORMAL HIGH (ref 0.57–1.00)
Globulin, Total: 3.5 g/dL (ref 1.5–4.5)
Glucose: 89 mg/dL (ref 70–99)
Potassium: 4.5 mmol/L (ref 3.5–5.2)
Sodium: 143 mmol/L (ref 134–144)
Total Protein: 7.6 g/dL (ref 6.0–8.5)
eGFR: 54 mL/min/{1.73_m2} — ABNORMAL LOW (ref >59)

## 2021-11-27 LAB — LIPID PANEL
Chol/HDL Ratio: 2.5 ratio (ref 0.0–4.4)
Cholesterol, Total: 135 mg/dL (ref 100–199)
HDL: 55 mg/dL (ref >39)
LDL Chol Calc (NIH): 70 mg/dL (ref 0–99)
Triglycerides: 40 mg/dL (ref 0–149)
VLDL Cholesterol Cal: 10 mg/dL (ref 5–40)

## 2021-11-27 LAB — MICROALBUMIN / CREATININE URINE RATIO
Creatinine, Urine: 106.6 mg/dL
Microalb/Creat Ratio: 5 mg/g creat (ref 0–29)
Microalbumin, Urine: 5.4 ug/mL

## 2021-11-27 LAB — HEMOGLOBIN A1C
Est. average glucose Bld gHb Est-mCnc: 111 mg/dL
Hgb A1c MFr Bld: 5.5 % (ref 4.8–5.6)

## 2021-12-02 ENCOUNTER — Encounter: Payer: Self-pay | Admitting: Orthopaedic Surgery

## 2021-12-02 ENCOUNTER — Ambulatory Visit: Payer: Medicare PPO | Admitting: Orthopaedic Surgery

## 2021-12-02 DIAGNOSIS — M25512 Pain in left shoulder: Secondary | ICD-10-CM

## 2021-12-02 DIAGNOSIS — G8929 Other chronic pain: Secondary | ICD-10-CM | POA: Diagnosis not present

## 2021-12-02 MED ORDER — METHYLPREDNISOLONE ACETATE 40 MG/ML IJ SUSP
40.0000 mg | INTRAMUSCULAR | Status: AC | PRN
  Administered 2021-12-02: 40 mg via INTRA_ARTICULAR

## 2021-12-02 MED ORDER — BUPIVACAINE HCL 0.5 % IJ SOLN
3.0000 mL | INTRAMUSCULAR | Status: AC | PRN
  Administered 2021-12-02: 3 mL via INTRA_ARTICULAR

## 2021-12-02 MED ORDER — LIDOCAINE HCL 1 % IJ SOLN
3.0000 mL | INTRAMUSCULAR | Status: AC | PRN
  Administered 2021-12-02: 3 mL

## 2021-12-02 NOTE — Progress Notes (Signed)
? ?Office Visit Note ?  ?Patient: Jessica Booker           ?Date of Birth: 1941/01/31           ?MRN: [id number] ?Visit Date: 12/02/2021 ?             ?Requested by: Dorothyann Peng, MD ?[address] ?[address] ?[place],  [place] [address] ?PCP: Dorothyann Peng, MD ? ? ?Assessment & Plan: ?Visit Diagnoses:  ?1. Chronic left shoulder pain   ? ? ?Plan: Impression is chronic left shoulder pain.  I feel that this is likely a combination of glenohumeral arthritis as well as rotator cuff tendinitis.  Based on treatment options she agreed to try a subacromial injection and rest for 6 weeks and home exercises as tolerated.  She will follow-up if the symptoms persist. ? ?Follow-Up Instructions: No follow-ups on file.  ? ?Orders:  ?No orders of the defined types were placed in this encounter. ? ?No orders of the defined types were placed in this encounter. ? ? ? ? Procedures: ?Large Joint Inj: L subacromial bursa on 12/02/2021 9:42 AM ?Indications: pain ?Details: 22 G needle ? ?Arthrogram: No ? ?Medications: 3 mL lidocaine 1 %; 3 mL bupivacaine 0.5 %; 40 mg methylPREDNISolone acetate 40 MG/ML ?Outcome: tolerated well, no immediate complications ?Patient was prepped and draped in the usual sterile fashion.  ? ? ? ? ?Clinical Data: ?No additional findings. ? ? ?Subjective: ?Chief Complaint  ?Patient presents with  ? Left Shoulder - Pain  ? ? ?HPI ? ?Jessica Booker is a pleasant 81 year old female here for left shoulder pain localized to the lateral portion.  Describes as aching throb.  Pain on and off for months wakes her up at night.  She did physical therapy last year which did not help.  Does home exercises above her head but the pain is worse with elevation of the arm above the shoulder.  Uses Tylenol Voltaren gel. ? ?Review of Systems  ?Constitutional: Negative.   ?HENT: Negative.    ?Eyes: Negative.   ?Respiratory: Negative.    ?Cardiovascular: Negative.   ?Endocrine: Negative.   ?Musculoskeletal: Negative.   ?Neurological:  Negative.   ?Hematological: Negative.   ?Psychiatric/Behavioral: Negative.    ?All other systems reviewed and are negative. ? ? ?Objective: ?Vital Signs: There were no vitals taken for this visit. ? ?Physical Exam ?Vitals and nursing note reviewed.  ?Constitutional:   ?   Appearance: She is well-developed.  ?Pulmonary:  ?   Effort: Pulmonary effort is normal.  ?Skin: ?   General: Skin is warm.  ?   Capillary Refill: Capillary refill takes less than 2 seconds.  ?Neurological:  ?   Mental Status: She is alert and oriented to person, place, and time.  ?Psychiatric:     ?   Behavior: Behavior normal.     ?   Thought Content: Thought content normal.     ?   Judgment: Judgment normal.  ? ? ?Ortho Exam ? ?Examination of left shoulder shows preserved range of motion.  Pain with abduction.  Pain with testing of superior cuff.  No pain with impingement testing. ? ?Specialty Comments:  ?No specialty comments available. ? ?Imaging: ?Mild OA of the glenohumeral joint.  Moderate OA of the AC joint. ? ? ?PMFS History: ?Patient Active Problem List  ? Diagnosis Date Noted  ? Dyspnea 04/11/2019  ? Belching 04/11/2019  ? Constipation 04/11/2019  ? Essential hypertension 11/02/2018  ? Lupus (HCC) 11/02/2018  ? Primary osteoarthritis  of both knees 06/06/2018  ? Cough variant asthma 08/25/2017  ? Systemic lupus erythematosus (HCC) 02/18/2017  ? High risk medication use 02/18/2017  ? Raynaud's disease without gangrene 02/18/2017  ? DDD (degenerative disc disease), lumbar 02/18/2017  ? Primary osteoarthritis of both hands 02/18/2017  ? Primary osteoarthritis of both feet 02/18/2017  ? Vitamin D deficiency 02/18/2017  ? History of hypertension 02/18/2017  ? History of neuropathy 02/18/2017  ? ?Past Medical History:  ?Diagnosis Date  ? Asthma   ? Hypertension   ? Lupus (HCC)   ?  ?Family History  ?Problem Relation Age of Onset  ? Heart attack Mother   ? Stomach cancer Father   ? Hypertension Sister   ? Heart disease Sister   ?  ?Past  Surgical History:  ?Procedure Laterality Date  ? ABDOMINAL HYSTERECTOMY    ? CHOLECYSTECTOMY    ? SHOULDER ARTHROSCOPY W/ ROTATOR CUFF REPAIR    ? R shoulder  ? TONSILLECTOMY    ? ?Social History  ? ?Occupational History  ? Occupation: retired  ?Tobacco Use  ? Smoking status: Never  ? Smokeless tobacco: Never  ?Vaping Use  ? Vaping Use: Never used  ?Substance and Sexual Activity  ? Alcohol use: No  ? Drug use: No  ? Sexual activity: Not Currently  ?  Birth control/protection: Surgical  ? ? ? ? ? ? ?

## 2021-12-02 NOTE — Progress Notes (Signed)
? ?Office Visit Note ? ?Patient: Jessica Booker             ?Date of Birth: Jul 07, 1941           ?MRN: [id number]             ?PCP: Dorothyann PengSanders, Robyn, MD ?Referring: Dorothyann PengSanders, Robyn, MD ?Visit Date: 12/15/2021 ?Occupation: @[employer] @ ? ?Subjective:  ?Medication management ? ?History of Present Illness: Jessica Booker is a 81 y.o. female with history of systemic lupus and osteoarthritis.  She states she continues to have pain and discomfort in her left shoulder.  She was seen by Dr. Roda ShuttersXu about 2 weeks ago and had a left shoulder joint injection.  Shoulder joint pain is gradually getting better.  She states she has popping sensation in her left knee joint off-and-on.  None of the other joints are painful or swollen.  She denies any history of oral ulcers, nasal ulcers, malar rash, photosensitivity, Raynaud's phenomenon.  She occasionally has dry mouth.  There is no history of inflammatory arthritis. ? ?Activities of Daily Living:  ?Patient reports morning stiffness for 0 minutes.   ?Patient Denies nocturnal pain.  ?Difficulty dressing/grooming: Denies ?Difficulty climbing stairs: Denies ?Difficulty getting out of chair: Denies ?Difficulty using hands for taps, buttons, cutlery, and/or writing: Denies ? ?Review of Systems  ?Constitutional:  Negative for fatigue.  ?HENT:  Negative for mouth sores, mouth dryness and nose dryness.   ?Eyes:  Negative for pain, itching and dryness.  ?Respiratory:  Negative for shortness of breath and difficulty breathing.   ?Cardiovascular:  Negative for chest pain and palpitations.  ?Gastrointestinal:  Negative for blood in stool, constipation and diarrhea.  ?Endocrine: Negative for increased urination.  ?Genitourinary:  Negative for difficulty urinating.  ?Musculoskeletal:  Positive for joint pain and joint pain. Negative for joint swelling, myalgias, morning stiffness, muscle tenderness and myalgias.  ?Skin:  Negative for color change, rash and sensitivity to sunlight.   ?Allergic/Immunologic: Negative for susceptible to infections.  ?Neurological:  Negative for dizziness, numbness, headaches, memory loss and weakness.  ?Hematological:  Negative for bruising/bleeding tendency and swollen glands.  ?Psychiatric/Behavioral:  Negative for depressed mood, confusion and sleep disturbance. The patient is not nervous/anxious.   ? ?PMFS History:  ?Patient Active Problem List  ? Diagnosis Date Noted  ? Dyspnea 04/11/2019  ? Belching 04/11/2019  ? Constipation 04/11/2019  ? Essential hypertension 11/02/2018  ? Lupus (HCC) 11/02/2018  ? Primary osteoarthritis of both knees 06/06/2018  ? Cough variant asthma 08/25/2017  ? Systemic lupus erythematosus (HCC) 02/18/2017  ? High risk medication use 02/18/2017  ? Raynaud's disease without gangrene 02/18/2017  ? DDD (degenerative disc disease), lumbar 02/18/2017  ? Primary osteoarthritis of both hands 02/18/2017  ? Primary osteoarthritis of both feet 02/18/2017  ? Vitamin D deficiency 02/18/2017  ? History of hypertension 02/18/2017  ? History of neuropathy 02/18/2017  ?  ?Past Medical History:  ?Diagnosis Date  ? Asthma   ? Hypertension   ? Lupus (HCC)   ?  ?Family History  ?Problem Relation Age of Onset  ? Heart attack Mother   ? Stomach cancer Father   ? Hypertension Sister   ? Heart disease Sister   ? ?Past Surgical History:  ?Procedure Laterality Date  ? ABDOMINAL HYSTERECTOMY    ? CHOLECYSTECTOMY    ? SHOULDER ARTHROSCOPY W/ ROTATOR CUFF REPAIR    ? R shoulder  ? TONSILLECTOMY    ? ?Social History  ? ?Social History Narrative  ? Not on  file  ? ?Immunization History  ?Administered Date(s) Administered  ? Fluad Quad(high Dose 65+) 05/27/2021  ? Influenza, High Dose Seasonal PF 06/21/2017, 05/29/2018, 05/16/2019  ? Influenza-Unspecified 05/30/2018, 05/16/2019, 05/13/2020  ? PFIZER Comirnaty(Gray Top)Covid-19 Tri-Sucrose Vaccine 12/25/2020  ? PFIZER(Purple Top)SARS-COV-2 Vaccination 10/04/2019, 10/30/2019, 05/23/2020  ? Secretary/administrator 30yrs & up 07/19/2021  ? Pneumococcal Conjugate-13 07/06/2019  ? Pneumococcal Polysaccharide-23 09/17/2013  ? Zoster Recombinat (Shingrix) 05/29/2018, 08/01/2018  ? Zoster, Live 10/17/2020  ?  ? ?Objective: ?Vital Signs: BP (!) 147/56 (BP Location: Right Arm, Patient Position: Sitting, Cuff Size: Normal)   Pulse (!) 56   Ht 5' 4.5" (1.638 m)   Wt 179 lb 9.6 oz (81.5 kg)   BMI 30.35 kg/m?   ? ?Physical Exam ?Vitals and nursing note reviewed.  ?Constitutional:   ?   Appearance: She is well-developed.  ?HENT:  ?   Head: Normocephalic and atraumatic.  ?Eyes:  ?   Conjunctiva/sclera: Conjunctivae normal.  ?Cardiovascular:  ?   Rate and Rhythm: Normal rate and regular rhythm.  ?   Heart sounds: Normal heart sounds.  ?Pulmonary:  ?   Effort: Pulmonary effort is normal.  ?   Breath sounds: Normal breath sounds.  ?Abdominal:  ?   General: Bowel sounds are normal.  ?   Palpations: Abdomen is soft.  ?Musculoskeletal:  ?   Cervical back: Normal range of motion.  ?Lymphadenopathy:  ?   Cervical: No cervical adenopathy.  ?Skin: ?   General: Skin is warm and dry.  ?   Capillary Refill: Capillary refill takes less than 2 seconds.  ?Neurological:  ?   Mental Status: She is alert and oriented to person, place, and time.  ?Psychiatric:     ?   Behavior: Behavior normal.  ?  ? ?Musculoskeletal Exam: She had limited rotation of her cervical spine and some stiffness with range of motion of the lumbar spine..  Shoulder joints, elbow joints, wrist joints, MCPs PIPs and DIPs with good range of motion with no synovitis.  Hip joints, knee joints, ankles, MTPs and PIPs with good range of motion with no synovitis.  She has crepitus with range of motion of her left knee joint. ? ?CDAI Exam: ?CDAI Score: -- ?Patient Global: --; Provider Global: -- ?Swollen: --; Tender: -- ?Joint Exam 12/15/2021  ? ?No joint exam has been documented for this visit  ? ?There is currently no information documented on the homunculus. Go to the  Rheumatology activity and complete the homunculus joint exam. ? ?Investigation: ?No additional findings. ? ?Imaging: ?No results found. ? ?Recent Labs: ?Lab Results  ?Component Value Date  ? WBC 6.6 11/26/2021  ? HGB 11.7 11/26/2021  ? PLT 294 11/26/2021  ? NA 143 11/26/2021  ? K 4.5 11/26/2021  ? CL 106 11/26/2021  ? CO2 22 11/26/2021  ? GLUCOSE 89 11/26/2021  ? BUN 21 11/26/2021  ? CREATININE 1.05 (H) 11/26/2021  ? BILITOT <0.2 11/26/2021  ? ALKPHOS 93 11/26/2021  ? AST 15 11/26/2021  ? ALT 11 11/26/2021  ? PROT 7.6 11/26/2021  ? ALBUMIN 4.1 11/26/2021  ? CALCIUM 9.5 11/26/2021  ? GFRAA 73 06/27/2020  ? ? ?Speciality Comments: PLQ Eye Exam: 01/12/2021 WNL @ [facility] Follow up in 1 year. ? ?Procedures:  ?No procedures performed ?Allergies: Patient has no known allergies.  ? 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  Labs obtained on July 31, 2021 were stable.  Her anti-Ro antibody was positive, anti-Smith and anti-RNP antibodies were negative.  Complements have been stable.  She had some recent labs by Dr. Allyne Gee which included CBC with differential, CMP with GFR and protein creatinine ratio which were all within normal limits.  She denies any history of oral ulcers, nasal ulcers, malar rash, full sensitivity, Raynaud's phenomenon or inflammatory arthritis.  She has been tolerating hydroxychloroquine without any side effects. ? ?High risk medication use - PLQ 200 mg 1 tablet by mouth twice daily M-F. PLQ Eye Exam: Jan 12, 2021.  Eye examination on Jan 12, 2021 was normal.  She had recent labs by Dr. Allyne Gee which were reviewed.  We will get autoimmune labs at the follow-up visit. ? ?Raynaud's disease without gangrene-currently not active. ? ?Chronic left shoulder pain-she was evaluated by Dr.Xu and had left shoulder joint which is better now. ? ?Primary osteoarthritis of both  hands-joint protection was discussed. ? ?Primary osteoarthritis of both knees-she has crepitus in her left knee joint.  Lower extremity muscle strengthening exercises were discussed.  A handout was placed in the AVS. ? ?Primary osteoar

## 2021-12-15 ENCOUNTER — Ambulatory Visit: Payer: Medicare PPO | Admitting: Rheumatology

## 2021-12-15 ENCOUNTER — Encounter: Payer: Self-pay | Admitting: Rheumatology

## 2021-12-15 VITALS — BP 147/56 | HR 56 | Ht 64.5 in | Wt 179.6 lb

## 2021-12-15 DIAGNOSIS — M19041 Primary osteoarthritis, right hand: Secondary | ICD-10-CM

## 2021-12-15 DIAGNOSIS — I7300 Raynaud's syndrome without gangrene: Secondary | ICD-10-CM

## 2021-12-15 DIAGNOSIS — M5136 Other intervertebral disc degeneration, lumbar region: Secondary | ICD-10-CM

## 2021-12-15 DIAGNOSIS — M436 Torticollis: Secondary | ICD-10-CM

## 2021-12-15 DIAGNOSIS — M25512 Pain in left shoulder: Secondary | ICD-10-CM

## 2021-12-15 DIAGNOSIS — M7062 Trochanteric bursitis, left hip: Secondary | ICD-10-CM

## 2021-12-15 DIAGNOSIS — Z8669 Personal history of other diseases of the nervous system and sense organs: Secondary | ICD-10-CM

## 2021-12-15 DIAGNOSIS — Z8679 Personal history of other diseases of the circulatory system: Secondary | ICD-10-CM

## 2021-12-15 DIAGNOSIS — Z79899 Other long term (current) drug therapy: Secondary | ICD-10-CM

## 2021-12-15 DIAGNOSIS — M170 Bilateral primary osteoarthritis of knee: Secondary | ICD-10-CM

## 2021-12-15 DIAGNOSIS — M19042 Primary osteoarthritis, left hand: Secondary | ICD-10-CM

## 2021-12-15 DIAGNOSIS — M25552 Pain in left hip: Secondary | ICD-10-CM

## 2021-12-15 DIAGNOSIS — M19071 Primary osteoarthritis, right ankle and foot: Secondary | ICD-10-CM | POA: Diagnosis not present

## 2021-12-15 DIAGNOSIS — M3219 Other organ or system involvement in systemic lupus erythematosus: Secondary | ICD-10-CM | POA: Diagnosis not present

## 2021-12-15 DIAGNOSIS — E559 Vitamin D deficiency, unspecified: Secondary | ICD-10-CM

## 2021-12-15 DIAGNOSIS — M19072 Primary osteoarthritis, left ankle and foot: Secondary | ICD-10-CM

## 2021-12-15 DIAGNOSIS — G8929 Other chronic pain: Secondary | ICD-10-CM

## 2021-12-15 NOTE — Patient Instructions (Addendum)
Vaccines ?You are taking a medication(s) that can suppress your immune system.  The following immunizations are recommended: ?Flu annually ?Covid-19  ?Td/Tdap (tetanus, diphtheria, pertussis) every 10 years ?Pneumonia (Prevnar 15 then Pneumovax 23 at least 1 year apart.  Alternatively, can take Prevnar 20 without needing additional dose) ?Shingrix: 2 doses from 4 weeks to 6 months apart ? ?Please check with your PCP to make sure you are up to date.  ? ?Knee Exercises ?Ask your health care provider which exercises are safe for you. Do exercises exactly as told by your health care provider and adjust them as directed. It is normal to feel mild stretching, pulling, tightness, or discomfort as you do these exercises. Stop right away if you feel sudden pain or your pain gets worse. Do not begin these exercises until told by your health care provider. ?Stretching and range-of-motion exercises ?These exercises warm up your muscles and joints and improve the movement and flexibility of your knee. These exercises also help to relieve pain and swelling. ?Knee extension, prone ? ?Lie on your abdomen (prone position) on a bed. ?Place your left / right knee just beyond the edge of the surface so your knee is not on the bed. You can put a towel under your left / right thigh just above your kneecap for comfort. ?Relax your leg muscles and allow gravity to straighten your knee (extension). You should feel a stretch behind your left / right knee. ?Hold this position for __________ seconds. ?Scoot up so your knee is supported between repetitions. ?Repeat __________ times. Complete this exercise __________ times a day. ?Knee flexion, active ? ?Lie on your back with both legs straight. If this causes back discomfort, bend your left / right knee so your foot is flat on the floor. ?Slowly slide your left / right heel back toward your buttocks. Stop when you feel a gentle stretch in the front of your knee or thigh (flexion). ?Hold this  position for __________ seconds. ?Slowly slide your left / right heel back to the starting position. ?Repeat __________ times. Complete this exercise __________ times a day. ?Quadriceps stretch, prone ? ?Lie on your abdomen on a firm surface, such as a bed or padded floor. ?Bend your left / right knee and hold your ankle. If you cannot reach your ankle or pant leg, loop a belt around your foot and grab the belt instead. ?Gently pull your heel toward your buttocks. Your knee should not slide out to the side. You should feel a stretch in the front of your thigh and knee (quadriceps). ?Hold this position for __________ seconds. ?Repeat __________ times. Complete this exercise __________ times a day. ?Hamstring, supine ? ?Lie on your back (supine position). ?Loop a belt or towel over the ball of your left / right foot. The ball of your foot is on the walking surface, right under your toes. ?Straighten your left / right knee and slowly pull on the belt to raise your leg until you feel a gentle stretch behind your knee (hamstring). ?Do not let your knee bend while you do this. ?Keep your other leg flat on the floor. ?Hold this position for __________ seconds. ?Repeat __________ times. Complete this exercise __________ times a day. ?Strengthening exercises ?These exercises build strength and endurance in your knee. Endurance is the ability to use your muscles for a long time, even after they get tired. ?Quadriceps, isometric ?This exercise strengthens the muscles in front of your thigh (quadriceps) without moving your knee joint (isometric). ?  Lie on your back with your left / right leg extended and your other knee bent. Put a rolled towel or small pillow under your knee if told by your health care provider. ?Slowly tense the muscles in the front of your left / right thigh. You should see your kneecap slide up toward your hip or see increased dimpling just above the knee. This motion will push the back of the knee toward  the floor. ?For __________ seconds, hold the muscle as tight as you can without increasing your pain. ?Relax the muscles slowly and completely. ?Repeat __________ times. Complete this exercise __________ times a day. ?Straight leg raises ?This exercise strengthens the muscles in front of your thigh (quadriceps) and the muscles that move your hips (hip flexors). ?Lie on your back with your left / right leg extended and your other knee bent. ?Tense the muscles in the front of your left / right thigh. You should see your kneecap slide up or see increased dimpling just above the knee. Your thigh may even shake a bit. ?Keep these muscles tight as you raise your leg 4-6 inches (10-15 cm) off the floor. Do not let your knee bend. ?Hold this position for __________ seconds. ?Keep these muscles tense as you lower your leg. ?Relax your muscles slowly and completely after each repetition. ?Repeat __________ times. Complete this exercise __________ times a day. ?Hamstring, isometric ? ?Lie on your back on a firm surface. ?Bend your left / right knee about __________ degrees. ?Dig your left / right heel into the surface as if you are trying to pull it toward your buttocks. Tighten the muscles in the back of your thighs (hamstring) to "dig" as hard as you can without increasing any pain. ?Hold this position for __________ seconds. ?Release the tension gradually and allow your muscles to relax completely for __________ seconds after each repetition. ?Repeat __________ times. Complete this exercise __________ times a day. ?Hamstring curls ?If told by your health care provider, do this exercise while wearing ankle weights. Begin with __________lb / kg weights. Then increase the weight by 1 lb (0.5 kg) increments. Do not wear ankle weights that are more than __________lb / kg. ?Lie on your abdomen with your legs straight. ?Bend your left / right knee as far as you can without feeling pain. Keep your hips flat against the floor. ?Hold  this position for __________ seconds. ?Slowly lower your leg to the starting position. ?Repeat __________ times. Complete this exercise __________ times a day. ?Squats ?This exercise strengthens the muscles in front of your thigh and knee (quadriceps). ?Stand in front of a table, with your feet and knees pointing straight ahead. You may rest your hands on the table for balance but not for support. ?Slowly bend your knees and lower your hips like you are going to sit in a chair. ?Keep your weight over your heels, not over your toes. ?Keep your lower legs upright so they are parallel with the table legs. ?Do not let your hips go lower than your knees. ?Do not bend lower than told by your health care provider. ?If your knee pain increases, do not bend as low. ?Hold the squat position for __________ seconds. ?Slowly push with your legs to return to standing. Do not use your hands to pull yourself to standing. ?Repeat __________ times. Complete this exercise __________ times a day. ?Wall slides ?This exercise strengthens the muscles in front of your thigh and knee (quadriceps). ?Lean your back against a smooth wall  or door, and walk your feet out 18-24 inches (46-61 cm) from it. ?Place your feet hip-width apart. ?Slowly slide down the wall or door until your knees bend __________ degrees. Keep your knees over your heels, not over your toes. Keep your knees in line with your hips. ?Hold this position for __________ seconds. ?Repeat __________ times. Complete this exercise __________ times a day. ?Straight leg raises, side-lying ?This exercise strengthens the muscles that rotate the leg at the hip and move it away from your body (hip abductors). ?Lie on your side with your left / right leg in the top position. Lie so your head, shoulder, knee, and hip line up. You may bend your bottom knee to help you keep your balance. ?Roll your hips slightly forward so your hips are stacked directly over each other and your left / right  knee is facing forward. ?Leading with your heel, lift your top leg 4-6 inches (10-15 cm). You should feel the muscles in your outer hip lifting. ?Do not let your foot drift forward. ?Do not let your kne

## 2021-12-23 ENCOUNTER — Emergency Department (HOSPITAL_BASED_OUTPATIENT_CLINIC_OR_DEPARTMENT_OTHER)
Admission: EM | Admit: 2021-12-23 | Discharge: 2021-12-24 | Disposition: A | Discharge status: Home / Self Care | Payer: Medicare PPO | Attending: Emergency Medicine | Admitting: Emergency Medicine

## 2021-12-23 ENCOUNTER — Encounter (HOSPITAL_BASED_OUTPATIENT_CLINIC_OR_DEPARTMENT_OTHER): Payer: Self-pay

## 2021-12-23 ENCOUNTER — Other Ambulatory Visit: Payer: Self-pay

## 2021-12-23 DIAGNOSIS — R109 Unspecified abdominal pain: Secondary | ICD-10-CM | POA: Insufficient documentation

## 2021-12-23 DIAGNOSIS — Z79899 Other long term (current) drug therapy: Secondary | ICD-10-CM | POA: Diagnosis not present

## 2021-12-23 DIAGNOSIS — M545 Low back pain, unspecified: Secondary | ICD-10-CM | POA: Diagnosis not present

## 2021-12-23 DIAGNOSIS — Z7982 Long term (current) use of aspirin: Secondary | ICD-10-CM | POA: Diagnosis not present

## 2021-12-23 LAB — CBC WITH DIFFERENTIAL/PLATELET
Abs Immature Granulocytes: 0.02 10*3/uL (ref 0.00–0.07)
Basophils Absolute: 0 10*3/uL (ref 0.0–0.1)
Basophils Relative: 0 %
Eosinophils Absolute: 0.2 10*3/uL (ref 0.0–0.5)
Eosinophils Relative: 2 %
HCT: 35.3 % — ABNORMAL LOW (ref 36.0–46.0)
Hemoglobin: 11.1 g/dL — ABNORMAL LOW (ref 12.0–15.0)
Immature Granulocytes: 0 %
Lymphocytes Relative: 18 %
Lymphs Abs: 1.8 10*3/uL (ref 0.7–4.0)
MCH: 25.9 pg — ABNORMAL LOW (ref 26.0–34.0)
MCHC: 31.4 g/dL (ref 30.0–36.0)
MCV: 82.5 fL (ref 80.0–100.0)
Monocytes Absolute: 0.7 10*3/uL (ref 0.1–1.0)
Monocytes Relative: 7 %
Neutro Abs: 7.2 10*3/uL (ref 1.7–7.7)
Neutrophils Relative %: 73 %
Platelets: 252 10*3/uL (ref 150–400)
RBC: 4.28 MIL/uL (ref 3.87–5.11)
RDW: 13.5 % (ref 11.5–15.5)
WBC: 9.9 10*3/uL (ref 4.0–10.5)
nRBC: 0 % (ref 0.0–0.2)

## 2021-12-23 LAB — URINALYSIS, ROUTINE W REFLEX MICROSCOPIC
Bilirubin Urine: NEGATIVE
Glucose, UA: NEGATIVE mg/dL
Hgb urine dipstick: NEGATIVE
Ketones, ur: NEGATIVE mg/dL
Nitrite: NEGATIVE
Protein, ur: NEGATIVE mg/dL
Specific Gravity, Urine: 1.016 (ref 1.005–1.030)
pH: 5.5 (ref 5.0–8.0)

## 2021-12-23 NOTE — ED Triage Notes (Signed)
Patient here POV from Home. ? ?Endorses Right Sided Flank Pain that awoke Patient Last PM that has continued since. Constant since it began and is Non-Radiating. ? ?No Known Trauma or Injury. No Urinary Symptoms. No Fevers. No N/V/D. ? ?NAD Noted during Triage. A&Ox4. Gcs 15. Ambulatory. ?

## 2021-12-24 ENCOUNTER — Telehealth: Payer: Self-pay

## 2021-12-24 ENCOUNTER — Emergency Department (HOSPITAL_BASED_OUTPATIENT_CLINIC_OR_DEPARTMENT_OTHER): Payer: Medicare PPO

## 2021-12-24 DIAGNOSIS — K5730 Diverticulosis of large intestine without perforation or abscess without bleeding: Secondary | ICD-10-CM | POA: Diagnosis not present

## 2021-12-24 DIAGNOSIS — R109 Unspecified abdominal pain: Secondary | ICD-10-CM | POA: Diagnosis not present

## 2021-12-24 LAB — COMPREHENSIVE METABOLIC PANEL
ALT: 11 U/L (ref 0–44)
AST: 14 U/L — ABNORMAL LOW (ref 15–41)
Albumin: 4.2 g/dL (ref 3.5–5.0)
Alkaline Phosphatase: 74 U/L (ref 38–126)
Anion gap: 9 (ref 5–15)
BUN: 22 mg/dL (ref 8–23)
CO2: 25 mmol/L (ref 22–32)
Calcium: 9.9 mg/dL (ref 8.9–10.3)
Chloride: 106 mmol/L (ref 98–111)
Creatinine, Ser: 0.9 mg/dL (ref 0.44–1.00)
GFR, Estimated: >60 mL/min (ref >60)
Glucose, Bld: 99 mg/dL (ref 70–99)
Potassium: 3.9 mmol/L (ref 3.5–5.1)
Sodium: 140 mmol/L (ref 135–145)
Total Bilirubin: 0.3 mg/dL (ref 0.3–1.2)
Total Protein: 7.7 g/dL (ref 6.5–8.1)

## 2021-12-24 LAB — LIPASE, BLOOD: Lipase: 50 U/L (ref 11–51)

## 2021-12-24 MED ORDER — LIDOCAINE 5 % EX PTCH: 1.0000 | MEDICATED_PATCH | CUTANEOUS | 0 refills | Status: AC

## 2021-12-24 MED ORDER — KETOROLAC TROMETHAMINE 30 MG/ML IJ SOLN
15.0000 mg | Freq: Once | INTRAMUSCULAR | Status: AC
Start: 2021-12-24 — End: 2021-12-24
  Administered 2021-12-24: 15 mg via INTRAVENOUS
  Filled 2021-12-24: qty 1

## 2021-12-24 MED ORDER — NAPROXEN 375 MG PO TABS: 375.0000 mg | ORAL_TABLET | Freq: Two times a day (BID) | ORAL | 0 refills | Status: DC

## 2021-12-24 NOTE — Telephone Encounter (Signed)
Transition Care Management Unsuccessful Follow-up Telephone Call ? ?Date of discharge and from where:  12/23/2021 [facility] ? ?Attempts:  1st Attempt ? ?Reason for unsuccessful TCM follow-up call:  Left voice message ? ?  ?

## 2021-12-24 NOTE — ED Provider Notes (Signed)
? ?[facility]  ?Provider Note ? ?CSN: [id number] ?Arrival date & time: 12/23/21 2118 ? ?History ?Chief Complaint  ?Patient presents with  ? Flank Pain  ? ? ?Jessica Booker is a 81 y.o. female reports about 24 hours of R flank pain, non radiating, sometimes worse with certain movements. No fever, nausea, vomiting, dysuria/frequency or hematuria. No falls or injuries but she was doing some exercises during the day before the pain started.  ? ? ?Home Medications ?Prior to Admission medications   ?Medication Sig Start Date End Date Taking? Authorizing Provider  ?lidocaine (LIDODERM) 5 % Place 1 patch onto the skin daily. Remove & Discard patch within 12 hours or as directed by MD 12/24/21  Yes Pollyann Savoy, MD  ?naproxen (NAPROSYN) 375 MG tablet Take 1 tablet (375 mg total) by mouth 2 (two) times daily. 12/24/21  Yes Pollyann Savoy, MD  ?albuterol (VENTOLIN HFA) 108 (90 Base) MCG/ACT inhaler TAKE 2 PUFFS BY MOUTH EVERY 6 HOURS AS NEEDED FOR WHEEZE OR SHORTNESS OF BREATH 07/22/21   Arnette Felts, FNP  ?aspirin 81 MG tablet Take 81 mg by mouth daily. Takes as needed    [provider]  ?clotrimazole (LOTRIMIN) 1 % external solution Apply 1 application topically 2 (two) times daily. In between toes ?Patient not taking: Reported on 11/26/2021 06/04/21   Asencion Islam, DPM  ?diclofenac sodium (VOLTAREN) 1 % GEL Apply 2 g to 4 g to affected joint up to 4 times daily PRN. 10/30/18   Gearldine Bienenstock, PA-C  ?hydroxychloroquine (PLAQUENIL) 200 MG tablet TAKE 1 TABLET BY MOUTH TWICE DAILY MONDAY THROUGH FRIDAY ONLY 05/18/21   Gearldine Bienenstock, PA-C  ?linaclotide (LINZESS) 72 MCG capsule Take 1 capsule (72 mcg total) by mouth daily before breakfast. ?Patient taking differently: Take 72 mcg by mouth as needed. 02/02/21   Dorothyann Peng, MD  ?loratadine (CLARITIN) 10 MG tablet TAKE 1 TABLET BY MOUTH EVERY DAY ?Patient not taking: Reported on 11/26/2021 01/09/20   Dorothyann Peng, MD  ?NIFEdipine  (ADALAT CC) 90 MG 24 hr tablet Take 1 tablet (90 mg total) by mouth daily. 08/03/21   Yates Decamp, MD  ?valsartan-hydrochlorothiazide (DIOVAN-HCT) 320-12.5 MG tablet TAKE 1 TABLET BY MOUTH EVERY DAY IN THE MORNING 08/25/21   Yates Decamp, MD  ? ? ? ?Allergies    ?Patient has no known allergies. ? ? ?Review of Systems   ?Review of Systems ?Please see HPI for pertinent positives and negatives ? ?Physical Exam ?BP (!) 186/58 (BP Location: Right Arm)   Pulse 72   Temp 98.1 ?F (36.7 ?C)   Resp 16   Ht 5' 4.5" (1.638 m)   Wt 81.5 kg   SpO2 100%   BMI 30.36 kg/m?  ? ?Physical Exam ?Vitals and nursing note reviewed.  ?Constitutional:   ?   Appearance: Normal appearance.  ?HENT:  ?   Head: Normocephalic and atraumatic.  ?   Nose: Nose normal.  ?   Mouth/Throat:  ?   Mouth: Mucous membranes are moist.  ?Eyes:  ?   Extraocular Movements: Extraocular movements intact.  ?   Conjunctiva/sclera: Conjunctivae normal.  ?Cardiovascular:  ?   Rate and Rhythm: Normal rate.  ?Pulmonary:  ?   Effort: Pulmonary effort is normal.  ?   Breath sounds: Normal breath sounds.  ?Abdominal:  ?   General: Abdomen is flat.  ?   Palpations: Abdomen is soft.  ?   Tenderness: There is no abdominal tenderness.  ?Musculoskeletal:     ?  General: Tenderness (mild R lumbar paraspinal muscles) present. No swelling. Normal range of motion.  ?   Cervical back: Neck supple.  ?Skin: ?   General: Skin is warm and dry.  ?Neurological:  ?   General: No focal deficit present.  ?   Mental Status: She is alert.  ?Psychiatric:     ?   Mood and Affect: Mood normal.  ? ? ?ED Results / Procedures / Treatments   ?EKG ?None ? ?Procedures ?Procedures ? ?Medications Ordered in the ED ?Medications  ?ketorolac (TORADOL) 30 MG/ML injection 15 mg (15 mg Intravenous Given 12/24/21 0118)  ? ? ?Initial Impression and Plan ? Patient with R flank pain, MSK vs renal colic, less likely vascular. Labs done in triage show CBC with mild anemia, CMP/lipase are normal. UA is borderline  but not having infectious symptoms. Will send for CT to rule out stone. Toradol for pain.  ? ?ED Course  ? ?Clinical Course as of 12/24/21 0150  ?Thu Dec 24, 2021  ?0148 I personally viewed the images from radiology studies and agree with radiologist interpretation: CT neg for kidney stone or other acute process. Likely MSK pain. Will plan d/c with NSAIDs, lidoderm and PCP follow up. Recommend rest, heat and massage.  ? [CS]  ?  ?Clinical Course User Index ?[CS] Pollyann Savoy, MD  ? ? ? ?MDM Rules/Calculators/A&P ?Medical Decision Making ?Problems Addressed: ?Acute right-sided low back pain without sciatica: acute illness or injury ? ?Amount and/or Complexity of Data Reviewed ?Labs: ordered. Decision-making details documented in ED Course. ?Radiology: ordered and independent interpretation performed. Decision-making details documented in ED Course. ? ?Risk ?Prescription drug management. ? ? ? ?Final Clinical Impression(s) / ED Diagnoses ?Final diagnoses:  ?Acute right-sided low back pain without sciatica  ? ? ?Rx / DC Orders ?ED Discharge Orders   ? ?      Ordered  ?  naproxen (NAPROSYN) 375 MG tablet  2 times daily       ? 12/24/21 0150  ?  lidocaine (LIDODERM) 5 %  Every 24 hours       ? 12/24/21 0150  ? ?  ?  ? ?  ? ?  ?Pollyann Savoy, MD ?12/24/21 0150 ? ?

## 2021-12-29 NOTE — Telephone Encounter (Signed)
Chmg-error.  

## 2022-01-01 ENCOUNTER — Ambulatory Visit: Payer: Medicare PPO | Admitting: Physician Assistant

## 2022-01-11 ENCOUNTER — Other Ambulatory Visit: Payer: Self-pay | Admitting: Physician Assistant

## 2022-01-11 ENCOUNTER — Other Ambulatory Visit: Payer: Self-pay | Admitting: Cardiology

## 2022-01-11 DIAGNOSIS — I351 Nonrheumatic aortic (valve) insufficiency: Secondary | ICD-10-CM

## 2022-01-11 DIAGNOSIS — I10 Essential (primary) hypertension: Secondary | ICD-10-CM

## 2022-01-11 DIAGNOSIS — M3219 Other organ or system involvement in systemic lupus erythematosus: Secondary | ICD-10-CM

## 2022-01-11 NOTE — Telephone Encounter (Signed)
Next Visit: 05/19/2022 ? ?Last Visit: 12/15/2021 ? ?Labs: 12/23/2021, Hemoglobin 11.1, HCT 35.3, MCH 25.9, AST 14,  ? ?Eye exam: 01/12/2021 WNL, per patient, appt 01/2022 ? ?Current Dose per office note 12/15/2021: PLQ 200 mg 1 tablet by mouth twice daily M-F ? 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 ?Last Fill: 05/18/2021  ? ?Okay to refill Plaquenil? ? ?

## 2022-02-12 ENCOUNTER — Other Ambulatory Visit: Payer: Self-pay | Admitting: Cardiology

## 2022-02-12 DIAGNOSIS — I340 Nonrheumatic mitral (valve) insufficiency: Secondary | ICD-10-CM

## 2022-02-12 DIAGNOSIS — I10 Essential (primary) hypertension: Secondary | ICD-10-CM

## 2022-02-22 DIAGNOSIS — Z79899 Other long term (current) drug therapy: Secondary | ICD-10-CM | POA: Diagnosis not present

## 2022-02-22 DIAGNOSIS — H26491 Other secondary cataract, right eye: Secondary | ICD-10-CM | POA: Diagnosis not present

## 2022-02-22 DIAGNOSIS — H25012 Cortical age-related cataract, left eye: Secondary | ICD-10-CM | POA: Diagnosis not present

## 2022-02-22 DIAGNOSIS — H524 Presbyopia: Secondary | ICD-10-CM | POA: Diagnosis not present

## 2022-03-10 ENCOUNTER — Ambulatory Visit: Payer: Medicare PPO | Admitting: Internal Medicine

## 2022-03-10 ENCOUNTER — Ambulatory Visit (INDEPENDENT_AMBULATORY_CARE_PROVIDER_SITE_OTHER): Payer: Medicare PPO

## 2022-03-10 VITALS — Ht 64.5 in | Wt 179.0 lb

## 2022-03-10 DIAGNOSIS — Z0000 Encounter for general adult medical examination without abnormal findings: Secondary | ICD-10-CM

## 2022-03-10 NOTE — Patient Instructions (Signed)
Jessica Booker , Thank you for taking time to come for your Medicare Wellness Visit. I appreciate your ongoing commitment to your health goals. Please review the following plan we discussed and let me know if I can assist you in the future.   Screening recommendations/referrals: Colonoscopy: not required Mammogram: completed 06/26/2021, due 06/27/2022 Bone Density: completed 08/25/2018 Recommended yearly ophthalmology/optometry visit for glaucoma screening and checkup Recommended yearly dental visit for hygiene and checkup  Vaccinations: Influenza vaccine: due 03/30/2022 Pneumococcal vaccine: completed 07/06/2019 Tdap vaccine: due Shingles vaccine: completed   Covid-19: 07/19/2021, 12/25/2020, 05/23/2020, 10/30/2019, 10/04/2019  Advanced directives: Please bring a copy of your POA (Power of Attorney) and/or Living Will to your next appointment.   Conditions/risks identified: none  Next appointment: Follow up in one year for your annual wellness visit    Preventive Care 65 Years and Older, Female Preventive care refers to lifestyle choices and visits with your health care provider that can promote health and wellness. What does preventive care include? A yearly physical exam. This is also called an annual well check. Dental exams once or twice a year. Routine eye exams. Ask your health care provider how often you should have your eyes checked. Personal lifestyle choices, including: Daily care of your teeth and gums. Regular physical activity. Eating a healthy diet. Avoiding tobacco and drug use. Limiting alcohol use. Practicing safe sex. Taking low-dose aspirin every day. Taking vitamin and mineral supplements as recommended by your health care provider. What happens during an annual well check? The services and screenings done by your health care provider during your annual well check will depend on your age, overall health, lifestyle risk factors, and family history of  disease. Counseling  Your health care provider may ask you questions about your: Alcohol use. Tobacco use. Drug use. Emotional well-being. Home and relationship well-being. Sexual activity. Eating habits. History of falls. Memory and ability to understand (cognition). Work and work Astronomer. Reproductive health. Screening  You may have the following tests or measurements: Height, weight, and BMI. Blood pressure. Lipid and cholesterol levels. These may be checked every 5 years, or more frequently if you are over 84 years old. Skin check. Lung cancer screening. You may have this screening every year starting at age 22 if you have a 30-pack-year history of smoking and currently smoke or have quit within the past 15 years. Fecal occult blood test (FOBT) of the stool. You may have this test every year starting at age 59. Flexible sigmoidoscopy or colonoscopy. You may have a sigmoidoscopy every 5 years or a colonoscopy every 10 years starting at age 15. Hepatitis C blood test. Hepatitis B blood test. Sexually transmitted disease (STD) testing. Diabetes screening. This is done by checking your blood sugar (glucose) after you have not eaten for a while (fasting). You may have this done every 1-3 years. Bone density scan. This is done to screen for osteoporosis. You may have this done starting at age 28. Mammogram. This may be done every 1-2 years. Talk to your health care provider about how often you should have regular mammograms. Talk with your health care provider about your test results, treatment options, and if necessary, the need for more tests. Vaccines  Your health care provider may recommend certain vaccines, such as: Influenza vaccine. This is recommended every year. Tetanus, diphtheria, and acellular pertussis (Tdap, Td) vaccine. You may need a Td booster every 10 years. Zoster vaccine. You may need this after age 69. Pneumococcal 13-valent conjugate (PCV13) vaccine. One  dose is recommended after age 35. Pneumococcal polysaccharide (PPSV23) vaccine. One dose is recommended after age 4. Talk to your health care provider about which screenings and vaccines you need and how often you need them. This information is not intended to replace advice given to you by your health care provider. Make sure you discuss any questions you have with your health care provider. Document Released: 09/12/2015 Document Revised: 05/05/2016 Document Reviewed: 06/17/2015 Elsevier Interactive Patient Education  2017 [place] Prevention in the Home Falls can cause injuries. They can happen to people of all ages. There are many things you can do to make your home safe and to help prevent falls. What can I do on the outside of my home? Regularly fix the edges of walkways and driveways and fix any cracks. Remove anything that might make you trip as you walk through a door, such as a raised step or threshold. Trim any bushes or trees on the path to your home. Use bright outdoor lighting. Clear any walking paths of anything that might make someone trip, such as rocks or tools. Regularly check to see if handrails are loose or broken. Make sure that both sides of any steps have handrails. Any raised decks and porches should have guardrails on the edges. Have any leaves, snow, or ice cleared regularly. Use sand or salt on walking paths during winter. Clean up any spills in your garage right away. This includes oil or grease spills. What can I do in the bathroom? Use night lights. Install grab bars by the toilet and in the tub and shower. Do not use towel bars as grab bars. Use non-skid mats or decals in the tub or shower. If you need to sit down in the shower, use a plastic, non-slip stool. Keep the floor dry. Clean up any water that spills on the floor as soon as it happens. Remove soap buildup in the tub or shower regularly. Attach bath mats securely with double-sided  non-slip rug tape. Do not have throw rugs and other things on the floor that can make you trip. What can I do in the bedroom? Use night lights. Make sure that you have a light by your bed that is easy to reach. Do not use any sheets or blankets that are too big for your bed. They should not hang down onto the floor. Have a firm chair that has side arms. You can use this for support while you get dressed. Do not have throw rugs and other things on the floor that can make you trip. What can I do in the kitchen? Clean up any spills right away. Avoid walking on wet floors. Keep items that you use a lot in easy-to-reach places. If you need to reach something above you, use a strong step stool that has a grab bar. Keep electrical cords out of the way. Do not use floor polish or wax that makes floors slippery. If you must use wax, use non-skid floor wax. Do not have throw rugs and other things on the floor that can make you trip. What can I do with my stairs? Do not leave any items on the stairs. Make sure that there are handrails on both sides of the stairs and use them. Fix handrails that are broken or loose. Make sure that handrails are as long as the stairways. Check any carpeting to make sure that it is firmly attached to the stairs. Fix any carpet that is loose or worn. Avoid  having throw rugs at the top or bottom of the stairs. If you do have throw rugs, attach them to the floor with carpet tape. Make sure that you have a light switch at the top of the stairs and the bottom of the stairs. If you do not have them, ask someone to add them for you. What else can I do to help prevent falls? Wear shoes that: Do not have high heels. Have rubber bottoms. Are comfortable and fit you well. Are closed at the toe. Do not wear sandals. If you use a stepladder: Make sure that it is fully opened. Do not climb a closed stepladder. Make sure that both sides of the stepladder are locked into place. Ask  someone to hold it for you, if possible. Clearly mark and make sure that you can see: Any grab bars or handrails. First and last steps. Where the edge of each step is. Use tools that help you move around (mobility aids) if they are needed. These include: Canes. Walkers. Scooters. Crutches. Turn on the lights when you go into a dark area. Replace any light bulbs as soon as they burn out. Set up your furniture so you have a clear path. Avoid moving your furniture around. If any of your floors are uneven, fix them. If there are any pets around you, be aware of where they are. Review your medicines with your doctor. Some medicines can make you feel dizzy. This can increase your chance of falling. Ask your doctor what other things that you can do to help prevent falls. This information is not intended to replace advice given to you by your health care provider. Make sure you discuss any questions you have with your health care provider. Document Released: 06/12/2009 Document Revised: 01/22/2016 Document Reviewed: 09/20/2014 Elsevier Interactive Patient Education  2017 [employer].

## 2022-03-10 NOTE — Progress Notes (Signed)
I connected with Jessica Booker today by telephone and verified that I am speaking with the correct person using two identifiers. Location patient: home Location provider: work Persons participating in the virtual visit: Jessica Booker, Elisha Ponder LPN.   I discussed the limitations, risks, security and privacy concerns of performing an evaluation and management service by telephone and the availability of in person appointments. I also discussed with the patient that there may be a patient responsible charge related to this service. The patient expressed understanding and verbally consented to this telephonic visit.    Interactive audio and video telecommunications were attempted between this provider and patient, however failed, due to patient having technical difficulties OR patient did not have access to video capability.  We continued and completed visit with audio only.     Vital signs may be patient reported or missing.  Subjective:   Jessica Booker is a 81 y.o. female who presents for Medicare Annual (Subsequent) preventive examination.  Review of Systems     Cardiac Risk Factors include: advanced age (>2men, >7 women);hypertension;obesity (BMI >30kg/m2)     Objective:    Today's Vitals   03/10/22 1356  Weight: 179 lb (81.2 kg)  Height: 5' 4.5" (1.638 m)  PainSc: 6    Body mass index is 30.25 kg/m.     03/10/2022    2:05 PM 12/23/2021    9:46 PM 02/18/2021    9:21 AM 02/16/2021    7:49 AM 11/14/2019    2:18 PM 11/02/2018    2:36 PM 02/23/2016   11:01 AM  Advanced Directives  Does Patient Have a Medical Advance Directive? Yes No Yes Yes Yes Yes No  Type of Estate agent of [place];Living will  Healthcare Power of [place];Living will  Healthcare Power of [place];Living will Living will;Healthcare Power of Attorney   Does patient want to make changes to medical advance directive?      No - Patient declined   Copy of Healthcare Power of  Attorney in Chart? No - copy requested  No - copy requested  No - copy requested No - copy requested   Would patient like information on creating a medical advance directive?  No - Patient declined         Current Medications (verified) Outpatient Encounter Medications as of 03/10/2022  Medication Sig   albuterol (VENTOLIN HFA) 108 (90 Base) MCG/ACT inhaler TAKE 2 PUFFS BY MOUTH EVERY 6 HOURS AS NEEDED FOR WHEEZE OR SHORTNESS OF BREATH   aspirin 81 MG tablet Take 81 mg by mouth daily.   diclofenac sodium (VOLTAREN) 1 % GEL Apply 2 g to 4 g to affected joint up to 4 times daily PRN.   hydroxychloroquine (PLAQUENIL) 200 MG tablet TAKE 1 TABLET BY MOUTH TWICE DAILY MONDAY THROUGH FRIDAY ONLY.   linaclotide (LINZESS) 72 MCG capsule Take 1 capsule (72 mcg total) by mouth daily before breakfast. (Patient taking differently: Take 72 mcg by mouth as needed.)   NIFEdipine (ADALAT CC) 90 MG 24 hr tablet Take 1 tablet (90 mg total) by mouth daily.   valsartan-hydrochlorothiazide (DIOVAN-HCT) 320-12.5 MG tablet TAKE 1 TABLET BY MOUTH EVERY DAY IN THE MORNING   clotrimazole (LOTRIMIN) 1 % external solution Apply 1 application topically 2 (two) times daily. In between toes (Patient not taking: Reported on 11/26/2021)   lidocaine (LIDODERM) 5 % Place 1 patch onto the skin daily. Remove & Discard patch within 12 hours or as directed by MD (Patient not taking: Reported on 03/10/2022)  loratadine (CLARITIN) 10 MG tablet TAKE 1 TABLET BY MOUTH EVERY DAY (Patient not taking: Reported on 11/26/2021)   naproxen (NAPROSYN) 375 MG tablet Take 1 tablet (375 mg total) by mouth 2 (two) times daily. (Patient not taking: Reported on 03/10/2022)   No facility-administered encounter medications on file as of 03/10/2022.    Allergies (verified) Patient has no known allergies.   History: Past Medical History:  Diagnosis Date   Asthma    Hypertension    Lupus ([place])    Past Surgical History:  Procedure Laterality Date    ABDOMINAL HYSTERECTOMY     CHOLECYSTECTOMY     SHOULDER ARTHROSCOPY W/ ROTATOR CUFF REPAIR     R shoulder   TONSILLECTOMY     Family History  Problem Relation Age of Onset   Heart attack Mother    Stomach cancer Father    Hypertension Sister    Heart disease Sister    Social History   Socioeconomic History   Marital status: Married    Spouse name: Not on file   Number of children: 2   Years of education: Not on file   Highest education level: Not on file  Occupational History   Occupation: retired  Tobacco Use   Smoking status: Never    Passive exposure: Never   Smokeless tobacco: Never  Vaping Use   Vaping Use: Never used  Substance and Sexual Activity   Alcohol use: No   Drug use: No   Sexual activity: Not Currently    Birth control/protection: Surgical  Other Topics Concern   Not on file  Social History Narrative   Not on file   Social Determinants of Health   Financial Resource Strain: Low Risk  (03/10/2022)   Overall Financial Resource Strain (CARDIA)    Difficulty of Paying Living Expenses: Not hard at all  Food Insecurity: No Food Insecurity (03/10/2022)   Hunger Vital Sign    Worried About Running Out of Food in the Last Year: Never true    Ran Out of Food in the Last Year: Never true  Transportation Needs: No Transportation Needs (03/10/2022)   PRAPARE - Hydrologist (Medical): No    Lack of Transportation (Non-Medical): No  Physical Activity: Sufficiently Active (03/10/2022)   Exercise Vital Sign    Days of Exercise per Week: 4 days    Minutes of Exercise per Session: 40 min  Stress: No Stress Concern Present (03/10/2022)   [place]    Feeling of Stress : Not at all  Social Connections: Not on file    Tobacco Counseling Counseling given: Not Answered   Clinical Intake:  Pre-visit preparation completed: Yes  Pain : 0-10 Pain Score: 6  Pain Type:  Chronic pain Pain Location: Shoulder Pain Orientation: Left Pain Descriptors / Indicators: Aching Pain Onset: More than a month ago Pain Frequency: Constant     Nutritional Status: BMI > 30  Obese Nutritional Risks: None Diabetes: No  How often do you need to have someone help you when you read instructions, pamphlets, or other written materials from your doctor or pharmacy?: 1 - Never What is the last grade level you completed in school?: college  Diabetic? no  Interpreter Needed?: No  Information entered by :: NAllen LPN   Activities of Daily Living    03/10/2022    2:06 PM  In your present state of health, do you have any difficulty performing the following  activities:  Hearing? 0  Vision? 0  Difficulty concentrating or making decisions? 0  Walking or climbing stairs? 0  Dressing or bathing? 0  Doing errands, shopping? 0  Preparing Food and eating ? N  Using the Toilet? N  In the past six months, have you accidently leaked urine? N  Do you have problems with loss of bowel control? N  Managing your Medications? N  Managing your Finances? N  Housekeeping or managing your Housekeeping? N    Patient Care Team: Glendale Chard, MD as PCP - General (Internal Medicine) Bo Merino, MD as Consulting Physician (Rheumatology) Katy Apo, MD as Consulting Physician (Ophthalmology)  Indicate any recent Medical Services you may have received from other than [facility] providers in the past year (date may be approximate).     Assessment:   This is a routine wellness examination for [place].  Hearing/Vision screen Vision Screening - Comments:: Regular eye exams, Dr. Prudencio Burly  Dietary issues and exercise activities discussed: Current Exercise Habits: Home exercise routine, Type of exercise: walking, Time (Minutes): 40, Frequency (Times/Week): 4, Weekly Exercise (Minutes/Week): 160   Goals Addressed             This Visit's Progress    Patient Stated        03/10/2022, restrict eating after a certain time       Depression Screen    03/10/2022    2:06 PM 02/18/2021    9:23 AM 11/20/2020    2:17 PM 11/14/2019    2:19 PM 05/23/2019   10:53 AM 04/11/2019   12:06 PM 11/02/2018    2:37 PM  PHQ 2/9 Scores  PHQ - 2 Score 0 0 0 0 0 0 0  PHQ- 9 Score    1   0    Fall Risk    03/10/2022    2:06 PM 02/18/2021    9:22 AM 11/20/2020    2:17 PM 11/14/2019    2:19 PM 05/23/2019   10:53 AM  Fall Risk   Falls in the past year? 0 1 0 0 0  Comment  shoe got caught up     Number falls in past yr: 0 0     Injury with Fall? 0 0     Risk for fall due to : Medication side effect Medication side effect  Medication side effect   Follow up Falls evaluation completed;Education provided;Falls prevention discussed Falls evaluation completed;Education provided;Falls prevention discussed  Falls evaluation completed;Education provided;Falls prevention discussed     FALL RISK PREVENTION PERTAINING TO THE HOME:  Any stairs in or around the home? Yes  If so, are there any without handrails? No  Home free of loose throw rugs in walkways, pet beds, electrical cords, etc? Yes  Adequate lighting in your home to reduce risk of falls? Yes   ASSISTIVE DEVICES UTILIZED TO PREVENT FALLS:  Life alert? No  Use of a cane, walker or w/c? No  Grab bars in the bathroom? Yes  Shower chair or bench in shower? Yes  Elevated toilet seat or a handicapped toilet? Yes   TIMED UP AND GO:  Was the test performed? No .      Cognitive Function:        03/10/2022    2:07 PM 02/18/2021    9:24 AM 11/14/2019    2:23 PM 11/02/2018    2:40 PM  6CIT Screen  What Year? 0 points 0 points 0 points 0 points  What month? 0 points 0 points 0  points 0 points  What time? 0 points 0 points 0 points 0 points  Count back from 20 0 points 0 points 0 points 0 points  Months in reverse 0 points 0 points 0 points 0 points  Repeat phrase 2 points 4 points 2 points 2 points  Total Score 2 points 4  points 2 points 2 points    Immunizations Immunization History  Administered Date(s) Administered   Fluad Quad(high Dose 65+) 05/27/2021   Influenza, High Dose Seasonal PF 06/21/2017, 05/29/2018, 05/16/2019   Influenza-Unspecified 05/30/2018, 05/16/2019, 05/13/2020   PFIZER Comirnaty(Gray Top)Covid-19 Tri-Sucrose Vaccine 12/25/2020   PFIZER(Purple Top)SARS-COV-2 Vaccination 10/04/2019, 10/30/2019, 05/23/2020   Pfizer Covid-19 Vaccine Bivalent Booster 37yrs & up 07/19/2021   Pneumococcal Conjugate-13 07/06/2019   Pneumococcal Polysaccharide-23 09/17/2013   Zoster Recombinat (Shingrix) 05/29/2018, 08/01/2018   Zoster, Live 10/17/2020    TDAP status: Due, Education has been provided regarding the importance of this vaccine. Advised may receive this vaccine at local pharmacy or Health Dept. Aware to provide a copy of the vaccination record if obtained from local pharmacy or Health Dept. Verbalized acceptance and understanding.  Flu Vaccine status: Up to date  Pneumococcal vaccine status: Up to date  Covid-19 vaccine status: Completed vaccines  Qualifies for Shingles Vaccine? Yes   Zostavax completed Yes   Shingrix Completed?: Yes  Screening Tests Health Maintenance  Topic Date Due   TETANUS/TDAP  01/10/2022   INFLUENZA VACCINE  03/30/2022   Pneumonia Vaccine 12+ Years old  Completed   DEXA SCAN  Completed   COVID-19 Vaccine  Completed   Zoster Vaccines- Shingrix  Completed   HPV VACCINES  Aged Out    Health Maintenance  Health Maintenance Due  Topic Date Due   TETANUS/TDAP  01/10/2022    Colorectal cancer screening: No longer required.   Mammogram status: Completed 06/26/2021. Repeat every year  Bone Density status: Completed 08/25/2018.   Lung Cancer Screening: (Low Dose CT Chest recommended if Age 36-80 years, 30 pack-year currently smoking OR have quit w/in 15years.) does not qualify.   Lung Cancer Screening Referral: no  Additional Screening:  Hepatitis C  Screening: does not qualify;  Vision Screening: Recommended annual ophthalmology exams for early detection of glaucoma and other disorders of the eye. Is the patient up to date with their annual eye exam?  Yes  Who is the provider or what is the name of the office in which the patient attends annual eye exams? Dr. Randon Goldsmith If pt is not established with a provider, would they like to be referred to a provider to establish care? No .   Dental Screening: Recommended annual dental exams for proper oral hygiene  Community Resource Referral / Chronic Care Management: CRR required this visit?  No   CCM required this visit?  No      Plan:     I have personally reviewed and noted the following in the patient's chart:   Medical and social history Use of alcohol, tobacco or illicit drugs  Current medications and supplements including opioid prescriptions.  Functional ability and status Nutritional status Physical activity Advanced directives List of other physicians Hospitalizations, surgeries, and ER visits in previous 12 months Vitals Screenings to include cognitive, depression, and falls Referrals and appointments  In addition, I have reviewed and discussed with patient certain preventive protocols, quality metrics, and best practice recommendations. A written personalized care plan for preventive services as well as general preventive health recommendations were provided to patient.     Herbie Saxon  Alanson Aly, LPN   [id number]   Nurse Notes: none  Due to this being a virtual visit, the after visit summary with patients personalized plan was offered to patient via mail or my-chart.  Patient preferred to pick up at office at next visit

## 2022-03-16 ENCOUNTER — Ambulatory Visit: Payer: Medicare PPO | Admitting: Internal Medicine

## 2022-03-24 ENCOUNTER — Encounter: Payer: Self-pay | Admitting: Internal Medicine

## 2022-03-24 ENCOUNTER — Ambulatory Visit: Payer: Medicare PPO | Admitting: Internal Medicine

## 2022-03-24 VITALS — BP 132/70 | HR 63 | Temp 97.9°F | Ht 61.6 in | Wt 181.2 lb

## 2022-03-24 DIAGNOSIS — G8929 Other chronic pain: Secondary | ICD-10-CM | POA: Diagnosis not present

## 2022-03-24 DIAGNOSIS — Z6833 Body mass index (BMI) 33.0-33.9, adult: Secondary | ICD-10-CM

## 2022-03-24 DIAGNOSIS — M25512 Pain in left shoulder: Secondary | ICD-10-CM

## 2022-03-24 DIAGNOSIS — E6609 Other obesity due to excess calories: Secondary | ICD-10-CM

## 2022-03-24 DIAGNOSIS — I10 Essential (primary) hypertension: Secondary | ICD-10-CM | POA: Diagnosis not present

## 2022-03-24 NOTE — Progress Notes (Signed)
Jeri Cos [place],acting as a Neurosurgeon for Gwynneth Aliment, MD.,have documented all relevant documentation on the behalf of Gwynneth Aliment, MD,as directed by  Gwynneth Aliment, MD while in the presence of Gwynneth Aliment, MD.    Subjective:     Patient ID: Jessica Booker , female    DOB: 07/13/1941 , 82 y.o.   MRN: [id number]   Chief Complaint  Patient presents with   Arm Pain    HPI  Patient presents today for left arm pain. She reports she was referred to Dr.Xu back in March. She was given a shot, she feels like her arm has not been right since. She feels like he hit the bone. She denies LUE weakness. There is pain with movement.  Arm Pain  The incident occurred more than 1 week ago. There was no injury mechanism. The pain is present in the left shoulder. The quality of the pain is described as aching and cramping. The pain is at a severity of 7/10. The pain is moderate.     Past Medical History:  Diagnosis Date   Asthma    Hypertension    Lupus ([facility]CC)      Family History  Problem Relation Age of Onset   Heart attack Mother    Stomach cancer Father    Hypertension Sister    Heart disease Sister      Current Outpatient Medications:    albuterol (VENTOLIN HFA) 108 (90 Base) MCG/ACT inhaler, TAKE 2 PUFFS BY MOUTH EVERY 6 HOURS AS NEEDED FOR WHEEZE OR SHORTNESS OF BREATH, Disp: 18 each, Rfl: 1   aspirin 81 MG tablet, Take 81 mg by mouth daily., Disp: , Rfl:    diclofenac sodium (VOLTAREN) 1 % GEL, Apply 2 g to 4 g to affected joint up to 4 times daily PRN., Disp: 4 Tube, Rfl: 2   hydroxychloroquine (PLAQUENIL) 200 MG tablet, TAKE 1 TABLET BY MOUTH TWICE DAILY MONDAY THROUGH FRIDAY ONLY., Disp: 120 tablet, Rfl: 0   lidocaine (LIDODERM) 5 %, Place 1 patch onto the skin daily. Remove & Discard patch within 12 hours or as directed by MD, Disp: 30 patch, Rfl: 0   linaclotide (LINZESS) 72 MCG capsule, Take 1 capsule (72 mcg total) by mouth daily before breakfast. (Patient  taking differently: Take 72 mcg by mouth as needed.), Disp: 30 capsule, Rfl: 1   NIFEdipine (ADALAT CC) 90 MG 24 hr tablet, Take 1 tablet (90 mg total) by mouth daily., Disp: 100 tablet, Rfl: 3   valsartan-hydrochlorothiazide (DIOVAN-HCT) 320-12.5 MG tablet, TAKE 1 TABLET BY MOUTH EVERY DAY IN THE MORNING, Disp: 90 tablet, Rfl: 1   No Known Allergies   Review of Systems  Constitutional: Negative.   Eyes: Negative.   Respiratory: Negative.    Cardiovascular: Negative.   Gastrointestinal: Negative.   Musculoskeletal:  Positive for arthralgias.  Skin: Negative.   Neurological: Negative.   Psychiatric/Behavioral: Negative.       Today's Vitals   03/24/22 0955  BP: 132/70  Pulse: 63  Temp: 97.9 F (36.6 C)  Weight: 181 lb 3.2 oz (82.2 kg)  Height: 5' 1.6" (1.565 m)  PainSc: 7   PainLoc: Shoulder   Body mass index is 33.57 kg/m.  Wt Readings from Last 3 Encounters:  03/24/22 181 lb 3.2 oz (82.2 kg)  03/10/22 179 lb (81.2 kg)  12/23/21 179 lb 10.8 oz (81.5 kg)    BP Readings from Last 3 Encounters:  03/24/22 132/70  12/24/21 (!) 186/58  12/15/21 (!) 147/56     Objective:  Physical Exam Vitals and nursing note reviewed.  Constitutional:      Appearance: Normal appearance.  HENT:     Head: Normocephalic and atraumatic.  Eyes:     Extraocular Movements: Extraocular movements intact.  Cardiovascular:     Rate and Rhythm: Normal rate and regular rhythm.     Heart sounds: Normal heart sounds.  Pulmonary:     Effort: Pulmonary effort is normal.     Breath sounds: Normal breath sounds.  Musculoskeletal:        General: Tenderness present.     Cervical back: Normal range of motion.  Skin:    General: Skin is warm.  Neurological:     General: No focal deficit present.     Mental Status: She is alert.  Psychiatric:        Mood and Affect: Mood normal.        Behavior: Behavior normal.      Assessment And Plan:     1. Chronic left shoulder pain Comments: She is  advised to apply pain cream to affected area. I will refer her to Ortho for second opinion as requested.  - Ambulatory referral to Orthopedic Surgery  2. Essential hypertension Comments: Chronic, fair control. Goal BP <130/80. She is encouraged to limit her intake of sodium rich foods.   3. Class 1 obesity due to excess calories with serious comorbidity and body mass index (BMI) of 33.0 to 33.9 in adult Comments: She is encouraged to aim for at least 150 minutes of exercise per week, while striving for BMI<30 to decrease cardiac risk.    Patient was given opportunity to ask questions. Patient verbalized understanding of the plan and was able to repeat key elements of the plan. All questions were answered to their satisfaction.   I, Gwynneth Aliment, MD, have reviewed all documentation for this visit. The documentation on 03/24/22 for the exam, diagnosis, procedures, and orders are all accurate and complete.   IF YOU HAVE BEEN REFERRED TO A SPECIALIST, IT MAY TAKE 1-2 WEEKS TO SCHEDULE/PROCESS THE REFERRAL. IF YOU HAVE NOT HEARD FROM US/SPECIALIST IN TWO WEEKS, PLEASE GIVE [place] A CALL AT [phone].   THE PATIENT IS ENCOURAGED TO PRACTICE SOCIAL DISTANCING DUE TO THE COVID-19 PANDEMIC.

## 2022-04-12 ENCOUNTER — Telehealth: Payer: Self-pay | Admitting: Rheumatology

## 2022-04-12 DIAGNOSIS — G8929 Other chronic pain: Secondary | ICD-10-CM

## 2022-04-12 NOTE — Telephone Encounter (Signed)
Ok to refer the patient to Dr. August Saucer

## 2022-04-12 NOTE — Telephone Encounter (Signed)
Patient called the office stating she would like Dr. Corliss Skains to recommend a doctor for her shoulder. Patient states she spoke with Dr. Corliss Skains about her shoulder and feels like its getting worse. Patient would like to see someone that could help.

## 2022-04-12 NOTE — Telephone Encounter (Signed)
Referral placed. Attempted to contact the patient and left message to advise patient we have placed referral for her to see Dr.Dean. Left phone number so patient can reach out to schedule appointment.

## 2022-04-28 ENCOUNTER — Encounter: Payer: Self-pay | Admitting: Orthopedic Surgery

## 2022-04-28 ENCOUNTER — Ambulatory Visit: Payer: Medicare PPO | Admitting: Orthopedic Surgery

## 2022-04-28 DIAGNOSIS — G8929 Other chronic pain: Secondary | ICD-10-CM | POA: Diagnosis not present

## 2022-04-28 DIAGNOSIS — M25512 Pain in left shoulder: Secondary | ICD-10-CM

## 2022-04-28 NOTE — Progress Notes (Signed)
Office Visit Note   Patient: Jessica Booker           Date of Birth: [birth year]           MRN: [id number] Visit Date: 04/28/2022 Requested by: Gearldine Bienenstock, PA-C [address] [place],  [place] [address] PCP: Dorothyann Peng, MD  Subjective: Chief Complaint  Patient presents with   Left Shoulder - Pain    HPI: Jessica Booker is an 81 year old patient with left shoulder pain.  Reports chronic pain which is tender to touch.  Pain wakes her from sleep almost every night.  Had an injection 423 which did not help.  She has been trying to exercise but without success.  She also has been using Vicks vapor rub which has worked better than Voltaren cream.  Describes some pain radiating into the neck.  She is actually pretty functional in the water when she does water exercises.  Pain does radiate to the elbow but denies any numbness and tingling.  Prior right rotator cuff surgery many years ago.  Takes Tylenol for symptoms.  Does have a history of lupus.              ROS: All systems reviewed are negative as they relate to the chief complaint within the history of present illness.  Patient denies  fevers or chills.   Assessment & Plan: Visit Diagnoses:  1. Chronic left shoulder pain     Plan: Impression is left shoulder pain which has a component at night which is concerning.  Could be radicular pain from the neck as at times her symptoms do improve when she puts her arm up overhead.  Nonetheless most of her symptoms localized to the shoulder itself.  Nothing really on exam today.  Radiographs also unremarkable.  Plan is MRI arthrogram left shoulder due to presence of night pain on the frequent basis as well as duration of symptoms longer than 4 months.  Failure of conservative treatment including medication as well as injection.  Follow-up after that study.  If the study is underwhelming then neck work-up consisting of radiographs and MRI scan indicated.  Follow-Up Instructions: Return for  after MRI.   Orders:  Orders Placed This Encounter  Procedures   MR Shoulder Left w/ contrast   Arthrogram   No orders of the defined types were placed in this encounter.     Procedures: No procedures performed   Clinical Data: No additional findings.  Objective: Vital Signs: There were no vitals taken for this visit.  Physical Exam:   Constitutional: Patient appears well-developed HEENT:  Head: Normocephalic Eyes:EOM are normal Neck: Normal range of motion Cardiovascular: Normal rate Pulmonary/chest: Effort normal Neurologic: Patient is alert Skin: Skin is warm Psychiatric: Patient has normal mood and affect   Ortho Exam: Ortho exam demonstrates full active and passive range of motion of the cervical spine.  Left shoulder demonstrates passive range of motion of 70/100/170.  Rotator cuff strength intact infraspinatus extremity subscap muscle testing.  No masses lymphadenopathy or skin changes noted in that shoulder region.  Not too much crepitus with internal/external rotation of that left arm at 90 degrees of abduction.  O'Brien's testing equivocal on the left negative on the right.  Specialty Comments:  No specialty comments available.  Imaging: No results found.   PMFS History: Patient Active Problem List   Diagnosis Date Noted   Dyspnea 04/11/2019   Belching 04/11/2019   Constipation 04/11/2019   Essential hypertension 11/02/2018  Lupus (HCC) 11/02/2018   Primary osteoarthritis of both knees 06/06/2018   Cough variant asthma 08/25/2017   Systemic lupus erythematosus (HCC) 02/18/2017   High risk medication use 02/18/2017   Raynaud's disease without gangrene 02/18/2017   DDD (degenerative disc disease), lumbar 02/18/2017   Primary osteoarthritis of both hands 02/18/2017   Primary osteoarthritis of both feet 02/18/2017   Vitamin D deficiency 02/18/2017   History of hypertension 02/18/2017   History of neuropathy 02/18/2017   Past Medical History:   Diagnosis Date   Asthma    Hypertension    Lupus (HCC)     Family History  Problem Relation Age of Onset   Heart attack Mother    Stomach cancer Father    Hypertension Sister    Heart disease Sister     Past Surgical History:  Procedure Laterality Date   ABDOMINAL HYSTERECTOMY     CHOLECYSTECTOMY     SHOULDER ARTHROSCOPY W/ ROTATOR CUFF REPAIR     R shoulder   TONSILLECTOMY     Social History   Occupational History   Occupation: retired  Tobacco Use   Smoking status: Never    Passive exposure: Never   Smokeless tobacco: Never  Vaping Use   Vaping Use: Never used  Substance and Sexual Activity   Alcohol use: No   Drug use: No   Sexual activity: Not Currently    Birth control/protection: Surgical

## 2022-05-05 NOTE — Progress Notes (Unsigned)
Office Visit Note  Patient: Jessica Booker             Date of Birth: 04/28/1941           MRN: [id number]             PCP: Glendale Chard, MD Referring: Glendale Chard, MD Visit Date: 05/19/2022 Occupation: '@[email or web address]' @  Subjective:  Medication monitoring   History of Present Illness: Jessica Booker is a 81 y.o. female with history of systemic lupus erythematosus and osteoarthritis.  She is taking plaquenil 200 mg 1 tablet by mouth twice daily Monday through Friday.  She is tolerating Plaquenil without any side effects and has not missed any doses recently.  She denies any signs or symptoms of a systemic lupus flare.  She has been experiencing significant discomfort in her left shoulder joint.  She has been under the care of Dr. Marlou Sa and is scheduled for an MRI tomorrow.  She has been taking Tylenol as needed for pain relief.  She is been experiencing significant discomfort at night when lying on her sides causing interrupted sleep at night.  She is also started to have some discomfort in her right shoulder.  She has occasional pain and stiffness in both hands but denies any joint swelling.  She has not had any recent rashes, photosensitivity, alopecia, oral or nasal ulcerations, sicca symptoms, shortness of breath, pleuritic chest pain, or symptoms of Raynaud's.  She remains on aspirin 81 mg daily as recommended by Dr. Einar Gip.    Activities of Daily Living:  Patient reports morning stiffness for 0 minutes.   Patient Reports nocturnal pain.  Difficulty dressing/grooming: Denies Difficulty climbing stairs: Denies Difficulty getting out of chair: Denies Difficulty using hands for taps, buttons, cutlery, and/or writing: Denies  Review of Systems  Constitutional:  Negative for fatigue.  HENT:  Negative for mouth sores and mouth dryness.   Eyes:  Negative for dryness.  Respiratory:  Negative for shortness of breath.   Cardiovascular:  Negative for chest pain and palpitations.   Gastrointestinal:  Negative for blood in stool, constipation and diarrhea.  Endocrine: Negative for increased urination.  Genitourinary:  Negative for involuntary urination.  Musculoskeletal:  Positive for joint pain, joint pain, myalgias and myalgias. Negative for gait problem, joint swelling, muscle weakness, morning stiffness and muscle tenderness.  Skin:  Negative for color change, rash, hair loss and sensitivity to sunlight.  Allergic/Immunologic: Negative for susceptible to infections.  Neurological:  Negative for dizziness and headaches.  Hematological:  Negative for swollen glands.  Psychiatric/Behavioral:  Positive for sleep disturbance. Negative for depressed mood. The patient is not nervous/anxious.     PMFS History:  Patient Active Problem List   Diagnosis Date Noted   Dyspnea 04/11/2019   Belching 04/11/2019   Constipation 04/11/2019   Essential hypertension 11/02/2018   Lupus ([place]) 11/02/2018   Primary osteoarthritis of both knees 06/06/2018   Cough variant asthma 08/25/2017   Systemic lupus erythematosus ([place]) 02/18/2017   High risk medication use 02/18/2017   Raynaud's disease without gangrene 02/18/2017   DDD (degenerative disc disease), lumbar 02/18/2017   Primary osteoarthritis of both hands 02/18/2017   Primary osteoarthritis of both feet 02/18/2017   Vitamin D deficiency 02/18/2017   History of hypertension 02/18/2017   History of neuropathy 02/18/2017    Past Medical History:  Diagnosis Date   Asthma    Hypertension    Lupus ([place])     Family History  Problem Relation Age of Onset  Heart attack Mother    Stomach cancer Father    Hypertension Sister    Heart disease Sister    Past Surgical History:  Procedure Laterality Date   ABDOMINAL HYSTERECTOMY     CHOLECYSTECTOMY     SHOULDER ARTHROSCOPY W/ ROTATOR CUFF REPAIR     R shoulder   TONSILLECTOMY     Social History   Social History Narrative   Not on file   Immunization History   Administered Date(s) Administered   Fluad Quad(high Dose 65+) 05/27/2021   Influenza, High Dose Seasonal PF 06/21/2017, 05/29/2018, 05/16/2019   Influenza-Unspecified 05/30/2018, 05/16/2019, 05/13/2020   PFIZER Comirnaty(Gray Top)Covid-19 Tri-Sucrose Vaccine 12/25/2020   PFIZER(Purple Top)SARS-COV-2 Vaccination 10/04/2019, 10/30/2019, 05/23/2020   Pfizer Covid-19 Vaccine Bivalent Booster 79yr & up 07/19/2021   Pneumococcal Conjugate-13 07/06/2019   Pneumococcal Polysaccharide-23 09/17/2013   Zoster Recombinat (Shingrix) 05/29/2018, 08/01/2018   Zoster, Live 10/17/2020     Objective: Vital Signs: BP 134/70 (BP Location: Right Arm, Patient Position: Sitting, Cuff Size: Normal)   Pulse 66   Resp 16   Ht 5' 4.5" (1.638 m)   Wt 181 lb (82.1 kg)   BMI 30.59 kg/m    Physical Exam Vitals and nursing note reviewed.  Constitutional:      Appearance: She is well-developed.  HENT:     Head: Normocephalic and atraumatic.  Eyes:     Conjunctiva/sclera: Conjunctivae normal.  Cardiovascular:     Rate and Rhythm: Normal rate and regular rhythm.     Heart sounds: Murmur heard.  Pulmonary:     Effort: Pulmonary effort is normal.     Breath sounds: Normal breath sounds.  Abdominal:     General: Bowel sounds are normal.     Palpations: Abdomen is soft.  Musculoskeletal:     Cervical back: Normal range of motion.  Skin:    General: Skin is warm and dry.     Capillary Refill: Capillary refill takes less than 2 seconds.  Neurological:     Mental Status: She is alert and oriented to person, place, and time.  Psychiatric:        Behavior: Behavior normal.      Musculoskeletal Exam: C-spine has good range of motion with some discomfort with lateral rotation.  Some midline tenderness in the thoracic region noted.  No SI joint tenderness upon palpation.  Painful and limited range of motion of the left shoulder joint.  Right shoulder has full range of motion with some discomfort.  Elbow  joints, wrist joints, MCPs, PIPs, DIPs have good range of motion with no synovitis.  She has PIP and DIP thickening consistent with osteoarthritis of both hands.  Hip joints have good range of motion with no groin pain.  Some tenderness over the right trochanteric bursa.  Knee joints have good range of motion with crepitus but no warmth or effusion.  Ankle joints have good range of motion with no tenderness or joint swelling.  CDAI Exam: CDAI Score: -- Patient Global: --; Provider Global: -- Swollen: --; Tender: -- Joint Exam 05/19/2022   No joint exam has been documented for this visit   There is currently no information documented on the homunculus. Go to the Rheumatology activity and complete the homunculus joint exam.  Investigation: No additional findings.  Imaging: No results found.  Recent Labs: Lab Results  Component Value Date   WBC 9.9 12/23/2021   HGB 11.1 (L) 12/23/2021   PLT 252 12/23/2021   NA 140 12/23/2021   K  3.9 12/23/2021   CL 106 12/23/2021   CO2 25 12/23/2021   GLUCOSE 99 12/23/2021   BUN 22 12/23/2021   CREATININE 0.90 12/23/2021   BILITOT 0.3 12/23/2021   ALKPHOS 74 12/23/2021   AST 14 (L) 12/23/2021   ALT 11 12/23/2021   PROT 7.7 12/23/2021   ALBUMIN 4.2 12/23/2021   CALCIUM 9.9 12/23/2021   GFRAA 73 06/27/2020    Speciality Comments: PLQ Eye Exam: 02/22/2022 WNL @ [facility] Follow up in 1 year.   Procedures:  No procedures performed Allergies: Patient has no known allergies.    Assessment / Plan:     Visit Diagnoses: Other systemic lupus erythematosus with other organ involvement ([place]) - -ANA, positive Smith, and positive Ro and RNP: She has not had any signs or symptoms of a systemic lupus flare.  She has clinically been doing well taking Plaquenil 200 mg 1 tablet by mouth twice daily Monday through Friday.  She continues to tolerate Plaquenil without any side effects and has not missed any doses recently.  She has no signs of a  malar rash, Raynaud's phenomenon, alopecia, oral or nasal ulcerations, or cervical lymphadenopathy.  She has not been experiencing any increased sicca symptoms.  Her energy level has been stable overall.  She has not experienced any shortness of breath, pleuritic chest pain, or palpitations.  She reminders under the care of Dr. Einar Gip and is taking aspirin 81 mg daily as recommended. She has no synovitis on examination today.  She is been experiencing persistent discomfort in her left shoulder joint and has been under the care of Dr. Marlou Sa.  She is scheduled for an MRI of the left shoulder tomorrow for further evaluation. Autoimmune lab work from 07/31/21 was reviewed today in the office: Smith ab-, Ro+, RNP-, ESR WNL, complements WNL, dsDNA negative, protein creatinine WNL.  The following lab work will be updated today for further evaluation.   She will remain on Plaquenil as prescribed.  She was advised to notify us if she develops signs or symptoms of a flare.  She will follow-up in the office in 5 months or sooner if needed.   - Plan: CBC with Differential/Platelet, COMPLETE METABOLIC PANEL WITH GFR, ANA, Protein / creatinine ratio, urine, Anti-DNA antibody, double-stranded, C3 and C4, Sedimentation rate  High risk medication use - Plaquenil 200 mg 1 tablet by mouth twice daily M-F.- Plan: CBC with Differential/Platelet, COMPLETE METABOLIC PANEL WITH GFR CBC and CMP updated on 12/23/21.  Orders for CBC and CMP released today.   PLQ Eye Exam: 02/22/2022 WNL @ [facility] Follow up in 1 year.  Raynaud's disease without gangrene: Not currently active.  No digital ulcerations or signs of gangrene were noted.  No signs of sclerodactyly.  She takes aspirin 81 mg daily.  Chronic left shoulder pain - She is currently under the care of Dr. Marlou Sa.  X-rays of the left shoulder on 07/31/2021 were consistent with [facility] joint arthritis.  She had a left subacromial bursa cortisone injection on 12/02/2021 which  provided little to no relief.  She has had persistent discomfort as well as pain at night causing interrupted sleep.  She has been taking Tylenol as needed for pain relief.  She has limited and painful range of motion on examination today.  She is scheduled for an MRI tomorrow for further evaluation.  Primary osteoarthritis of both hands: She has PIP and DIP thickening consistent with osteoarthritis of both hands.  CMC joint prominence noted bilaterally.  Tenderness over  the right CMC joint.  Discussed the importance of joint protection and muscle strengthening.  Primary osteoarthritis of both knees: She has good range of motion of both knee joints on examination today.  Crepitus noted bilaterally.  No warmth or effusion was noted.  Primary osteoarthritis of both feet: She is not experiencing any increased discomfort in her feet at this time.  She has good range of motion of both ankle joints with no tenderness or synovitis.  Neck stiffness - Multilevel severe spondylosis was noted.  She has discomfort and stiffness with range of motion especially with lateral rotation.  No symptoms of radiculopathy at this time.  DDD (degenerative disc disease), lumbar: No midline spinal tenderness.  No SI joint tenderness upon palpation.  No symptoms of radiculopathy.  Other medical conditions are listed as follows:  History of neuropathy  Vitamin D deficiency  History of hypertension: Blood pressure was 134/70 today in the office.  Orders: Orders Placed This Encounter  Procedures   CBC with Differential/Platelet   COMPLETE METABOLIC PANEL WITH GFR   ANA   Protein / creatinine ratio, urine   Anti-DNA antibody, double-stranded   C3 and C4   Sedimentation rate   No orders of the defined types were placed in this encounter.     Follow-Up Instructions: Return in about 5 months (around 10/19/2022) for Systemic lupus erythematosus, Osteoarthritis, DDD.   Ofilia Neas, PA-C  Note - This record has  been created using Dragon software.  Chart creation errors have been sought, but may not always  have been located. Such creation errors do not reflect on  the standard of medical care.

## 2022-05-17 ENCOUNTER — Other Ambulatory Visit: Payer: Self-pay | Admitting: Internal Medicine

## 2022-05-19 ENCOUNTER — Ambulatory Visit: Payer: Medicare PPO | Attending: Physician Assistant | Admitting: Physician Assistant

## 2022-05-19 ENCOUNTER — Encounter: Payer: Self-pay | Admitting: Physician Assistant

## 2022-05-19 VITALS — BP 134/70 | HR 66 | Resp 16 | Ht 64.5 in | Wt 181.0 lb

## 2022-05-19 DIAGNOSIS — M3219 Other organ or system involvement in systemic lupus erythematosus: Secondary | ICD-10-CM | POA: Diagnosis not present

## 2022-05-19 DIAGNOSIS — M170 Bilateral primary osteoarthritis of knee: Secondary | ICD-10-CM | POA: Diagnosis not present

## 2022-05-19 DIAGNOSIS — G8929 Other chronic pain: Secondary | ICD-10-CM

## 2022-05-19 DIAGNOSIS — M19071 Primary osteoarthritis, right ankle and foot: Secondary | ICD-10-CM | POA: Diagnosis not present

## 2022-05-19 DIAGNOSIS — M5136 Other intervertebral disc degeneration, lumbar region: Secondary | ICD-10-CM | POA: Diagnosis not present

## 2022-05-19 DIAGNOSIS — I7300 Raynaud's syndrome without gangrene: Secondary | ICD-10-CM | POA: Diagnosis not present

## 2022-05-19 DIAGNOSIS — M436 Torticollis: Secondary | ICD-10-CM

## 2022-05-19 DIAGNOSIS — Z79899 Other long term (current) drug therapy: Secondary | ICD-10-CM | POA: Diagnosis not present

## 2022-05-19 DIAGNOSIS — Z8679 Personal history of other diseases of the circulatory system: Secondary | ICD-10-CM

## 2022-05-19 DIAGNOSIS — E559 Vitamin D deficiency, unspecified: Secondary | ICD-10-CM

## 2022-05-19 DIAGNOSIS — M25512 Pain in left shoulder: Secondary | ICD-10-CM | POA: Diagnosis not present

## 2022-05-19 DIAGNOSIS — M19041 Primary osteoarthritis, right hand: Secondary | ICD-10-CM

## 2022-05-19 DIAGNOSIS — Z8669 Personal history of other diseases of the nervous system and sense organs: Secondary | ICD-10-CM

## 2022-05-19 DIAGNOSIS — M19072 Primary osteoarthritis, left ankle and foot: Secondary | ICD-10-CM

## 2022-05-19 DIAGNOSIS — M19042 Primary osteoarthritis, left hand: Secondary | ICD-10-CM

## 2022-05-20 ENCOUNTER — Ambulatory Visit
Admission: RE | Admit: 2022-05-20 | Discharge: 2022-05-20 | Disposition: A | Discharge status: Home / Self Care | Payer: Medicare PPO | Source: Ambulatory Visit | Attending: Orthopedic Surgery | Admitting: Orthopedic Surgery

## 2022-05-20 DIAGNOSIS — G8929 Other chronic pain: Secondary | ICD-10-CM

## 2022-05-20 DIAGNOSIS — M25512 Pain in left shoulder: Secondary | ICD-10-CM | POA: Diagnosis not present

## 2022-05-20 DIAGNOSIS — S46012A Strain of muscle(s) and tendon(s) of the rotator cuff of left shoulder, initial encounter: Secondary | ICD-10-CM | POA: Diagnosis not present

## 2022-05-20 MED ORDER — IOPAMIDOL (ISOVUE-M 200) INJECTION 41%
15.0000 mL | Freq: Once | INTRAMUSCULAR | Status: AC
  Administered 2022-05-20: 15 mL via INTRA_ARTICULAR

## 2022-05-21 ENCOUNTER — Ambulatory Visit: Payer: Medicare PPO | Admitting: Orthopedic Surgery

## 2022-05-21 DIAGNOSIS — M792 Neuralgia and neuritis, unspecified: Secondary | ICD-10-CM

## 2022-05-22 ENCOUNTER — Encounter: Payer: Self-pay | Admitting: Orthopedic Surgery

## 2022-05-22 NOTE — Progress Notes (Signed)
Office Visit Note   Patient: Jessica Booker           Date of Birth: May 23, 1941           MRN: [id number] Visit Date: 05/21/2022 Requested by: Dorothyann Peng, MD [address] [place],  [place] [address] PCP: Dorothyann Peng, MD  Subjective: Chief Complaint  Patient presents with   Other    Scan review    HPI: [name] is a an 81 year old patient with left shoulder pain.  She states that it is hard for her to rest.  Nighttime pain is worse.  Localizes pain to the deltoid region.  Denies much in the way of mechanical symptoms in the shoulder.  States that the pain radiates up to her neck and to some degree around to the right-hand side as well.  Does describe occasional scapular pain.  I reviewed the scan with Malachi Bonds.  Report not available but it looks like she is got partial-thickness rotator cuff tearing with mild biceps tendinosis.  No full-thickness retracted rotator cuff tear present and not too much arthritis in the glenohumeral joint.  The inferior capsular region also does not appear to be thickened.              ROS: All systems reviewed are negative as they relate to the chief complaint within the history of present illness.  Patient denies  fevers or chills.   Assessment & Plan: Visit Diagnoses:  1. Radicular pain in left arm     Plan: Impression is left shoulder pain with relatively benign appearing MRI scan for someone who is 81.  I think this could be more related to her neck than shoulder.  She has had prior cervical spine radiographs done within the last year which show loss of lordosis along with significant degenerative disc disease in the middle cervical spine region.  I think that is the likely culprit in this case.  Symptoms are bad enough that she would consider intervention in the form of injections and/or surgery for this problem and therefore MRI scan of the neck is indicated to evaluate left-sided radiculopathy.  Follow-up after that study  Follow-Up  Instructions: No follow-ups on file.   Orders:  Orders Placed This Encounter  Procedures   MR Cervical Spine w/o contrast   No orders of the defined types were placed in this encounter.     Procedures: No procedures performed   Clinical Data: No additional findings.  Objective: Vital Signs: There were no vitals taken for this visit.  Physical Exam:   Constitutional: Patient appears well-developed HEENT:  Head: Normocephalic Eyes:EOM are normal Neck: Normal range of motion Cardiovascular: Normal rate Pulmonary/chest: Effort normal Neurologic: Patient is alert Skin: Skin is warm Psychiatric: Patient has normal mood and affect   Ortho Exam: Ortho exam demonstrates reasonable cervical spine range of motion with flexion chin to chest extension about 40 degrees and rotation 60 degrees.  Left shoulder has no masses lymphadenopathy or skin changes noted in that shoulder girdle region.  Pretty reasonable range of motion and strength comparable to the left-hand side.  Motor or sensory function to the hand intact.  No paresthesias C5-T1.  Radial pulse intact bilaterally.  No coarse grinding or crepitus noted with left shoulder passive range of motion as well as right shoulder passive range of motion  Specialty Comments:  No specialty comments available.  Imaging: No results found.   PMFS History: Patient Active Problem List   Diagnosis Date Noted  Dyspnea 04/11/2019   Belching 04/11/2019   Constipation 04/11/2019   Essential hypertension 11/02/2018   Lupus ([place]) 11/02/2018   Primary osteoarthritis of both knees 06/06/2018   Cough variant asthma 08/25/2017   Systemic lupus erythematosus ([place]) 02/18/2017   High risk medication use 02/18/2017   Raynaud's disease without gangrene 02/18/2017   DDD (degenerative disc disease), lumbar 02/18/2017   Primary osteoarthritis of both hands 02/18/2017   Primary osteoarthritis of both feet 02/18/2017   Vitamin D deficiency  02/18/2017   History of hypertension 02/18/2017   History of neuropathy 02/18/2017   Past Medical History:  Diagnosis Date   Asthma    Hypertension    Lupus ([place])     Family History  Problem Relation Age of Onset   Heart attack Mother    Stomach cancer Father    Hypertension Sister    Heart disease Sister     Past Surgical History:  Procedure Laterality Date   ABDOMINAL HYSTERECTOMY     CHOLECYSTECTOMY     SHOULDER ARTHROSCOPY W/ ROTATOR CUFF REPAIR     R shoulder   TONSILLECTOMY     Social History   Occupational History   Occupation: retired  Tobacco Use   Smoking status: Never    Passive exposure: Never   Smokeless tobacco: Never  Vaping Use   Vaping Use: Never used  Substance and Sexual Activity   Alcohol use: No   Drug use: No   Sexual activity: Not Currently    Birth control/protection: Surgical

## 2022-05-24 LAB — COMPLETE METABOLIC PANEL WITH GFR
AG Ratio: 1.2 (calc) (ref 1.0–2.5)
ALT: 13 U/L (ref 6–29)
AST: 15 U/L (ref 10–35)
Albumin: 4.3 g/dL (ref 3.6–5.1)
Alkaline phosphatase (APISO): 86 U/L (ref 37–153)
BUN/Creatinine Ratio: 24 (calc) — ABNORMAL HIGH (ref 6–22)
BUN: 23 mg/dL (ref 7–25)
CO2: 26 mmol/L (ref 20–32)
Calcium: 9.9 mg/dL (ref 8.6–10.4)
Chloride: 105 mmol/L (ref 98–110)
Creat: 0.97 mg/dL — ABNORMAL HIGH (ref 0.60–0.95)
Globulin: 3.7 g/dL (calc) (ref 1.9–3.7)
Glucose, Bld: 89 mg/dL (ref 65–99)
Potassium: 4.3 mmol/L (ref 3.5–5.3)
Sodium: 138 mmol/L (ref 135–146)
Total Bilirubin: 0.3 mg/dL (ref 0.2–1.2)
Total Protein: 8 g/dL (ref 6.1–8.1)
eGFR: 59 mL/min/{1.73_m2} — ABNORMAL LOW (ref >=60)

## 2022-05-24 LAB — CBC WITH DIFFERENTIAL/PLATELET
Absolute Monocytes: 403 cells/uL (ref 200–950)
Basophils Absolute: 19 cells/uL (ref 0–200)
Basophils Relative: 0.3 %
Eosinophils Absolute: 180 cells/uL (ref 15–500)
Eosinophils Relative: 2.9 %
HCT: 35.9 % (ref 35.0–45.0)
Hemoglobin: 11.7 g/dL (ref 11.7–15.5)
Lymphs Abs: 2133 cells/uL (ref 850–3900)
MCH: 26.9 pg — ABNORMAL LOW (ref 27.0–33.0)
MCHC: 32.6 g/dL (ref 32.0–36.0)
MCV: 82.5 fL (ref 80.0–100.0)
MPV: 10 fL (ref 7.5–12.5)
Monocytes Relative: 6.5 %
Neutro Abs: 3466 cells/uL (ref 1500–7800)
Neutrophils Relative %: 55.9 %
Platelets: 293 10*3/uL (ref 140–400)
RBC: 4.35 10*6/uL (ref 3.80–5.10)
RDW: 12.7 % (ref 11.0–15.0)
Total Lymphocyte: 34.4 %
WBC: 6.2 10*3/uL (ref 3.8–10.8)

## 2022-05-24 LAB — C3 AND C4
C3 Complement: 142 mg/dL
C4 Complement: 30 mg/dL

## 2022-05-24 LAB — SEDIMENTATION RATE: Sed Rate: 22 mm/h (ref 0–30)

## 2022-05-24 LAB — PROTEIN / CREATININE RATIO, URINE
Creatinine, Urine: 111 mg/dL (ref 20–275)
Protein/Creat Ratio: 63 mg/g creat (ref 24–184)
Protein/Creatinine Ratio: 0.063 mg/mg creat (ref 0.024–0.184)
Total Protein, Urine: 7 mg/dL (ref 5–24)

## 2022-05-24 LAB — ANA: Anti Nuclear Antibody (ANA): POSITIVE — AB

## 2022-05-24 LAB — ANTI-NUCLEAR AB-TITER (ANA TITER): ANA Titer 1: 1:320 {titer} — ABNORMAL HIGH

## 2022-05-24 LAB — ANTI-DNA ANTIBODY, DOUBLE-STRANDED: ds DNA Ab: 3 IU/mL

## 2022-05-25 NOTE — Progress Notes (Signed)
CBC stable. Creatinine is borderline elevated and GFR is borderline low-59.  Avoid NSAID use and remain hydrated.  ANA is positive.  dsDNA is negative.  ESR WNL.  Complements WNL.  Protein creatinine ratio WNL. Labs are not consistent with a flare at this time.

## 2022-06-08 ENCOUNTER — Ambulatory Visit
Admission: RE | Admit: 2022-06-08 | Discharge: 2022-06-08 | Disposition: A | Discharge status: Home / Self Care | Payer: Medicare PPO | Source: Ambulatory Visit | Attending: Orthopedic Surgery | Admitting: Orthopedic Surgery

## 2022-06-08 DIAGNOSIS — M792 Neuralgia and neuritis, unspecified: Secondary | ICD-10-CM

## 2022-06-08 DIAGNOSIS — R200 Anesthesia of skin: Secondary | ICD-10-CM | POA: Diagnosis not present

## 2022-06-08 DIAGNOSIS — M542 Cervicalgia: Secondary | ICD-10-CM | POA: Diagnosis not present

## 2022-06-08 DIAGNOSIS — M4802 Spinal stenosis, cervical region: Secondary | ICD-10-CM | POA: Diagnosis not present

## 2022-06-11 ENCOUNTER — Ambulatory Visit: Payer: Medicare PPO | Admitting: Orthopedic Surgery

## 2022-06-11 DIAGNOSIS — M792 Neuralgia and neuritis, unspecified: Secondary | ICD-10-CM | POA: Diagnosis not present

## 2022-06-11 DIAGNOSIS — M25512 Pain in left shoulder: Secondary | ICD-10-CM | POA: Diagnosis not present

## 2022-06-11 DIAGNOSIS — G8929 Other chronic pain: Secondary | ICD-10-CM | POA: Diagnosis not present

## 2022-06-13 ENCOUNTER — Encounter: Payer: Self-pay | Admitting: Orthopedic Surgery

## 2022-06-13 NOTE — Progress Notes (Signed)
Office Visit Note   Patient: Jessica Booker           Date of Birth: Aug 08, 1941           MRN: [id number] Visit Date: 06/11/2022 Requested by: Glendale Chard, [place] [address] [place],  [place] [address] PCP: Glendale Chard, MD  Subjective: Chief Complaint  Patient presents with   Other    Scan review    HPI: Jessica Booker is a 81 y.o. female who presents to the office reporting left shoulder and neck pain.  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  By foraminal stenosis C3-4 to C5-6.  She has also had an MRI scan of the left shoulder which shows partial-thickness and longitudinal rotator cuff tear signal in the supraspinatus.  No tendon retraction or atrophy.  Moderate supraspinatus muscle degeneration with mild degenerative changes of the AC joint.  No definite retractable treatable rotator cuff tears present.  The pain does not radiate below the elbow on the left.  Symptoms worse at night.  Hard for her to lift.  Worse when she lays down.  She wants to go swimming today..                ROS: All systems reviewed are negative as they relate to the chief complaint within the history of present illness.  Patient denies fevers or chills.  Assessment & Plan: Visit Diagnoses:  1. Radicular pain in left arm   2. Chronic left shoulder pain     Plan: Impression is neck symptoms and shoulder symptoms.  The shoulder really looks like it may or may not be the culprit here.  Definitely nothing operative in the shoulder.  Her last injection was painful when it "went on the bone".  That was done in a different location in her building.  Plan at this time is subacromial injection in the left shoulder.  We will see how she does with that.  She does not want to think about any type of neck injection.  Follow-up as needed.  Follow-Up Instructions: No follow-ups on  file.   Orders:  No orders of the defined types were placed in this encounter.  No orders of the defined types were placed in this encounter.     Procedures: No procedures performed   Clinical Data: No additional findings.  Objective: Vital Signs: There were no vitals taken for this visit.  Physical Exam:  Constitutional: Patient appears well-developed HEENT:  Head: Normocephalic Eyes:EOM are normal Neck: Normal range of motion Cardiovascular: Normal rate Pulmonary/chest: Effort normal Neurologic: Patient is alert Skin: Skin is warm Psychiatric: Patient has normal mood and affect  Ortho Exam: Ortho exam demonstrates mild pain with passive range of motion of that left shoulder.  She has pretty reasonable rotator cuff strength infraspinatus extremity subscap muscle testing with no restriction asymmetrically of external rotation of 50 degrees of abduction.  Cervical spine range of motion flexion almost chin to chest extension 30 degrees rotation is 40 degrees bilaterally.  No masses lymphadenopathy or skin changes noted in the left shoulder girdle region.  No real coarse grinding or crepitus with internal/external rotation at 90 degrees of abduction  Specialty Comments:  No specialty comments available.  Imaging: No results found.   PMFS History: Patient Active Problem List   Diagnosis Date Noted   Dyspnea 04/11/2019   Belching 04/11/2019  Constipation 04/11/2019   Essential hypertension 11/02/2018   Lupus ([facility]) 11/02/2018   Primary osteoarthritis of both knees 06/06/2018   Cough variant asthma 08/25/2017   Systemic lupus erythematosus ([facility]) 02/18/2017   High risk medication use 02/18/2017   Raynaud's disease without gangrene 02/18/2017   DDD (degenerative disc disease), lumbar 02/18/2017   Primary osteoarthritis of both hands 02/18/2017   Primary osteoarthritis of both feet 02/18/2017   Vitamin D deficiency 02/18/2017   History of hypertension 02/18/2017    History of neuropathy 02/18/2017   Past Medical History:  Diagnosis Date   Asthma    Hypertension    Lupus ([facility])     Family History  Problem Relation Age of Onset   Heart attack Mother    Stomach cancer Father    Hypertension Sister    Heart disease Sister     Past Surgical History:  Procedure Laterality Date   ABDOMINAL HYSTERECTOMY     CHOLECYSTECTOMY     SHOULDER ARTHROSCOPY W/ ROTATOR CUFF REPAIR     R shoulder   TONSILLECTOMY     Social History   Occupational History   Occupation: retired  Tobacco Use   Smoking status: Never    Passive exposure: Never   Smokeless tobacco: Never  Vaping Use   Vaping Use: Never used  Substance and Sexual Activity   Alcohol use: No   Drug use: No   Sexual activity: Not Currently    Birth control/protection: Surgical

## 2022-06-30 ENCOUNTER — Ambulatory Visit: Payer: Medicare PPO | Admitting: Internal Medicine

## 2022-07-12 ENCOUNTER — Other Ambulatory Visit: Payer: Self-pay | Admitting: Physician Assistant

## 2022-07-12 DIAGNOSIS — M3219 Other organ or system involvement in systemic lupus erythematosus: Secondary | ICD-10-CM

## 2022-07-12 NOTE — Telephone Encounter (Signed)
Next Visit: 11/04/2022  Last Visit: 05/19/2022  Labs: 05/19/2022 CBC stable. Creatinine is borderline elevated and GFR is borderline low-59.  Avoid NSAID use and remain hydrated. ANA is positive.  dsDNA is negative.  ESR WNL.  Complements WNL.  Protein creatinine ratio WNL. Labs are not consistent with a flare at this time.  Eye exam: 02/22/2022   Current Dose per office note on 05/19/2022: Plaquenil 200 mg 1 tablet by mouth twice daily M-F.   DX: Other systemic lupus erythematosus with other organ involvement    Last Fill: 01/11/2022  Okay to refill Plaquenil?

## 2022-07-13 ENCOUNTER — Other Ambulatory Visit: Payer: Self-pay | Admitting: Cardiology

## 2022-07-13 ENCOUNTER — Other Ambulatory Visit: Payer: Self-pay

## 2022-07-13 DIAGNOSIS — I351 Nonrheumatic aortic (valve) insufficiency: Secondary | ICD-10-CM

## 2022-07-13 DIAGNOSIS — I10 Essential (primary) hypertension: Secondary | ICD-10-CM

## 2022-07-13 MED ORDER — NIFEDIPINE ER 90 MG PO TB24: 90.0000 mg | ORAL_TABLET | Freq: Every day | ORAL | 0 refills | Status: DC

## 2022-07-28 ENCOUNTER — Encounter: Payer: Self-pay | Admitting: Internal Medicine

## 2022-07-28 ENCOUNTER — Ambulatory Visit: Payer: Medicare PPO | Admitting: Internal Medicine

## 2022-07-28 VITALS — BP 128/72 | HR 68 | Temp 98.1°F | Ht 64.0 in | Wt 183.8 lb

## 2022-07-28 DIAGNOSIS — E6609 Other obesity due to excess calories: Secondary | ICD-10-CM | POA: Diagnosis not present

## 2022-07-28 DIAGNOSIS — Z6831 Body mass index (BMI) 31.0-31.9, adult: Secondary | ICD-10-CM | POA: Diagnosis not present

## 2022-07-28 DIAGNOSIS — R0982 Postnasal drip: Secondary | ICD-10-CM | POA: Diagnosis not present

## 2022-07-28 DIAGNOSIS — I119 Hypertensive heart disease without heart failure: Secondary | ICD-10-CM | POA: Diagnosis not present

## 2022-07-28 DIAGNOSIS — I700 Atherosclerosis of aorta: Secondary | ICD-10-CM

## 2022-07-28 DIAGNOSIS — E559 Vitamin D deficiency, unspecified: Secondary | ICD-10-CM

## 2022-07-28 MED ORDER — ATORVASTATIN CALCIUM 10 MG PO TABS: ORAL_TABLET | ORAL | 11 refills | Status: DC

## 2022-07-28 MED ORDER — CETIRIZINE HCL 5 MG PO TABS: 5.0000 mg | ORAL_TABLET | Freq: Every day | ORAL | 1 refills | Status: DC

## 2022-07-28 NOTE — Patient Instructions (Addendum)
Vitamin D 2000 unit capsules once daily.   Atherosclerosis  Atherosclerosis is when plaque builds up in the arteries. This causes narrowing and hardening of the arteries. Arteries are blood vessels that carry blood from the heart to all parts of the body. This blood contains oxygen. Plaque occurs due to inflammation or from a buildup of fat, cholesterol, calcium, waste products of cells, and a clotting material in the blood (fibrin). Plaque decreases the amount of blood that can flow through the artery. Atherosclerosis can affect any artery in your body, including: Heart arteries. Damage to these arteries may lead to coronary artery disease, which can cause a heart attack. Brain arteries. Damage to these arteries may cause a stroke. Leg, arm, and pelvis arteries. Peripheral artery disease (PAD) may result from damage to these arteries. Kidney arteries. Kidney (renal) failure may result from damage to kidney arteries. Treatment may slow the disease and prevent further damage to your heart, brain, peripheral arteries, and kidneys. What are the causes? This condition develops slowly over many years. The inner layers of your arteries become damaged and allow the gradual buildup of plaque. The exact cause of atherosclerosis is not fully understood. Symptoms of atherosclerosis do not occur until an artery becomes narrow or blocked. What increases the risk? The following factors may make you more likely to develop this condition: Being middle-aged or older. Certain medical conditions, including: High blood pressure. High cholesterol. High blood fats (triglycerides). Diabetes. Sleep apnea. Obesity. Certain lab levels, including: Elevated C-reactive protein (CRP). This is a sign of increased inflammation in your body. Elevated homocysteine levels. This is an amino acid that is associated with heart and blood vessel disease. Using tobacco or nicotine products. A family history of  atherosclerosis. Not exercising enough (sedentary lifestyle). Being stressed. Drinking too much alcohol or using drugs, such as cocaine or methamphetamine. What are the signs or symptoms? Symptoms of atherosclerosis do not occur until the plaque severely narrows or blocks the artery, which decreases blood flow. Sometimes, atherosclerosis does not cause symptoms. Symptoms of this condition include: Coronary artery disease. This may cause chest pain and shortness of breath. Decreased blood supply to your brain, which may cause a stroke. Signs of a stroke may include sudden: Weakness or numbness in your face, arm, or leg, especially on one side of your body. Trouble walking or difficulty moving your arms or legs. Loss of balance or coordination. Confusion. Slurred speech. Trouble speaking, or trouble understanding speech, or both (aphasia). Vision changes in one or both eyes. This may be double vision, blurred vision, or loss of vision. Severe headache with no known cause. The headache is often described as the worst headache ever experienced. PAD, which may cause pain, numbness, or nonhealing wounds, often in your legs and hips. Renal failure. This may cause tiredness, problems with urination, swelling, and itchy skin. How is this diagnosed? This condition is diagnosed based on your medical history and a physical exam. During the exam, your health care provider will: Check your pulse in different places. Listen for a "whooshing" sound over your arteries (bruit). You may also have tests, such as: Blood tests to check your levels of cholesterol, triglycerides, blood sugar, and CRP. Ankle-brachial index to compare blood pressure in your arms to blood pressure in your ankles to see how your blood is flowing. Heart (cardiac) tests. Electrocardiogram (ECG) to check for heart damage. Stress test to see how your heart reacts to exercise. Ultrasound tests. Ultrasound of your peripheral arteries to  check  blood flow. Echocardiogram to get images of your heart's chambers and valves. X-ray tests. Chest X-ray to see if you have an enlarged heart, which is a sign of heart failure. CT scan to check for damage to your heart, brain, or arteries. Angiogram. This is a test where dye is injected and X-rays are used to see the blood flow in the arteries. How is this treated? This condition is treated with lifestyle changes as the first step. These may include: Changing your diet. Losing weight. Reducing stress. Exercising and being physically active more regularly. Quitting smoking. You may also need medicine to: Lower triglycerides and cholesterol. Control blood pressure. Prevent blood clots. Lower inflammation in your body. Control your blood sugar. Sometimes, surgery is needed to: Remove plaque from an artery (endarterectomy). Open or widen a narrowed heart artery or peripheral artery (angioplasty). Create a new path for your blood with one of these procedures: Heart (coronary) artery bypass graft surgery. Peripheral artery bypass graft surgery. Place a small mesh tube (stent) in an artery to open or widen a narrowed artery. Follow these instructions at home: Eating and drinking  Eat a heart-healthy diet. Talk with your health care provider or a dietitian if you need help. A heart-healthy diet involves: Limiting unhealthy fats and increasing healthy fats. Some examples of healthy fats are avocados and olive oil. Eating plant-based foods, such as fruits, vegetables, nuts, whole grains, and legumes (such as peas and lentils). If you drink alcohol: Limit how much you have to: 0-1 drink a day for women who are not pregnant. 0-2 drinks a day for men. Know how much alcohol is in a drink. In the [place] one drink equals one 12 oz bottle of beer (355 mL), one 5 oz glass of wine (148 mL), or one 1 oz glass of hard liquor (44 mL). Lifestyle  Maintain a healthy weight. Lose weight if your  health care provider says that you need to do that. Follow an exercise program as told by your health care provider. Do not use any products that contain nicotine or tobacco. These products include cigarettes, chewing tobacco, and vaping devices, such as e-cigarettes. If you need help quitting, ask your health care provider. Do not use drugs. General instructions Take over-the-counter and prescription medicines only as told by your health care provider. Manage other health conditions as told. Keep all follow-up visits. This is important. Contact a health care provider if you have: An irregular heartbeat. Unexplained tiredness (fatigue). Trouble urinating, or you are producing less urine or foamy urine. Swelling of your hands or feet, or itchy skin. Unexplained pain or numbness in your legs or hips. A wound that is slow to heal or is not healing. Get help right away if: You have any symptoms of a heart attack. These may be: Chest pain. This includes squeezing chest pain that may feel like indigestion (angina). Shortness of breath. Pain in your neck, jaw, arms, back, or stomach. Cold sweat. Nausea. Light-headedness. Sudden pain, numbness, or coldness in a limb. You have any symptoms of a stroke. "BE FAST" is an easy way to remember the main warning signs of a stroke: B - Balance. Signs are dizziness, sudden trouble walking, or loss of balance. E - Eyes. Signs are trouble seeing or a sudden change in vision. F - Face. Signs are sudden weakness or numbness of the face, or the face or eyelid drooping on one side. A - Arms. Signs are weakness or numbness in an arm. This happens suddenly  and usually on one side of the body. S - Speech. Signs are sudden trouble speaking, slurred speech, or trouble understanding what people say. T - Time. Time to call emergency services. Write down what time symptoms started. You have other signs of a stroke, such as: A sudden, severe headache with no known  cause. Nausea or vomiting. Seizure. These symptoms may represent a serious problem that is an emergency. Do not wait to see if the symptoms will go away. Get medical help right away. Call your local emergency services (911 in the [place] Do not drive yourself to the hospital. Summary Atherosclerosis is when plaque builds up in the arteries and causes narrowing and hardening of the arteries. Plaque occurs due to inflammation or from a buildup of fat, cholesterol, calcium, cellular waste products, and fibrin. This condition may not cause any symptoms. Symptoms of atherosclerosis do not occur until the plaque severely narrows or blocks the artery. Treatment starts with lifestyle changes and may include medicines. In some cases, surgery is needed. Get help right away if you have any symptoms of a heart attack or stroke. This information is not intended to replace advice given to you by your health care provider. Make sure you discuss any questions you have with your health care provider. Document Revised: 11/19/2020 Document Reviewed: 11/19/2020 Elsevier Patient Education  2023 [employer]   Hypertension, Adult Hypertension is another name for high blood pressure. High blood pressure forces your heart to work harder to pump blood. This can cause problems over time. There are two numbers in a blood pressure reading. There is a top number (systolic) over a bottom number (diastolic). It is best to have a blood pressure that is below 120/80. What are the causes? The cause of this condition is not known. Some other conditions can lead to high blood pressure. What increases the risk? Some lifestyle factors can make you more likely to develop high blood pressure: Smoking. Not getting enough exercise or physical activity. Being overweight. Having too much fat, sugar, calories, or salt (sodium) in your diet. Drinking too much alcohol. Other risk factors include: Having any of these  conditions: Heart disease. Diabetes. High cholesterol. Kidney disease. Obstructive sleep apnea. Having a family history of high blood pressure and high cholesterol. Age. The risk increases with age. Stress. What are the signs or symptoms? High blood pressure may not cause symptoms. Very high blood pressure (hypertensive crisis) may cause: Headache. Fast or uneven heartbeats (palpitations). Shortness of breath. Nosebleed. Vomiting or feeling like you may vomit (nauseous). Changes in how you see. Very bad chest pain. Feeling dizzy. Seizures. How is this treated? This condition is treated by making healthy lifestyle changes, such as: Eating healthy foods. Exercising more. Drinking less alcohol. Your doctor may prescribe medicine if lifestyle changes do not help enough and if: Your top number is above 130. Your bottom number is above 80. Your personal target blood pressure may vary. Follow these instructions at home: Eating and drinking  If told, follow the DASH eating plan. To follow this plan: Fill one half of your plate at each meal with fruits and vegetables. Fill one fourth of your plate at each meal with whole grains. Whole grains include whole-wheat pasta, brown rice, and whole-grain bread. Eat or drink low-fat dairy products, such as skim milk or low-fat yogurt. Fill one fourth of your plate at each meal with low-fat (lean) proteins. Low-fat proteins include fish, chicken without skin, eggs, beans, and tofu. Avoid fatty meat, cured  and processed meat, or chicken with skin. Avoid pre-made or processed food. Limit the amount of salt in your diet to less than 1,500 mg each day. Do not drink alcohol if: Your doctor tells you not to drink. You are pregnant, may be pregnant, or are planning to become pregnant. If you drink alcohol: Limit how much you have to: 0-1 drink a day for women. 0-2 drinks a day for men. Know how much alcohol is in your drink. In the [place] one  drink equals one 12 oz bottle of beer (355 mL), one 5 oz glass of wine (148 mL), or one 1 oz glass of hard liquor (44 mL). Lifestyle  Work with your doctor to stay at a healthy weight or to lose weight. Ask your doctor what the best weight is for you. Get at least 30 minutes of exercise that causes your heart to beat faster (aerobic exercise) most days of the week. This may include walking, swimming, or biking. Get at least 30 minutes of exercise that strengthens your muscles (resistance exercise) at least 3 days a week. This may include lifting weights or doing Pilates. Do not smoke or use any products that contain nicotine or tobacco. If you need help quitting, ask your doctor. Check your blood pressure at home as told by your doctor. Keep all follow-up visits. Medicines Take over-the-counter and prescription medicines only as told by your doctor. Follow directions carefully. Do not skip doses of blood pressure medicine. The medicine does not work as well if you skip doses. Skipping doses also puts you at risk for problems. Ask your doctor about side effects or reactions to medicines that you should watch for. Contact a doctor if: You think you are having a reaction to the medicine you are taking. You have headaches that keep coming back. You feel dizzy. You have swelling in your ankles. You have trouble with your vision. Get help right away if: You get a very bad headache. You start to feel mixed up (confused). You feel weak or numb. You feel faint. You have very bad pain in your: Chest. Belly (abdomen). You vomit more than once. You have trouble breathing. These symptoms may be an emergency. Get help right away. Call 911. Do not wait to see if the symptoms will go away. Do not drive yourself to the hospital. Summary Hypertension is another name for high blood pressure. High blood pressure forces your heart to work harder to pump blood. For most people, a normal blood pressure  is less than 120/80. Making healthy choices can help lower blood pressure. If your blood pressure does not get lower with healthy choices, you may need to take medicine. This information is not intended to replace advice given to you by your health care provider. Make sure you discuss any questions you have with your health care provider. Document Revised: 06/04/2021 Document Reviewed: 06/04/2021 Elsevier Patient Education  2023 [employer].

## 2022-07-28 NOTE — Progress Notes (Signed)
Jessica Booker,acting as a Neurosurgeon for Gwynneth Aliment, MD.,have documented all relevant documentation on the behalf of Gwynneth Aliment, MD,as directed by  Gwynneth Aliment, MD while in the presence of Gwynneth Aliment, MD.    Subjective:     Patient ID: Jessica Booker , female    DOB: July 13, 1941 , 81 y.o.   MRN: [id number]   Chief Complaint  Patient presents with   Hypertension    HPI  She presents for bp check. She reports compliance with meds. She denies headaches, chest pain and shortness of breath. She has no specific concerns or complaints at this time.     Hypertension This is a chronic problem. The current episode started more than 1 year ago. The problem has been gradually improving since onset. The problem is controlled. Associated symptoms include anxiety and neck pain (c/o neck pain. Denies UEweakness/tingling. Muscles feel tight. ). Pertinent negatives include no headaches, palpitations or peripheral edema. Risk factors for coronary artery disease include sedentary lifestyle. Past treatments include diuretics. There are no compliance problems.  There is no history of angina. There is no history of chronic renal disease.     Past Medical History:  Diagnosis Date   Asthma    Hypertension    Lupus (HCC)      Family History  Problem Relation Age of Onset   Heart attack Mother    Stomach cancer Father    Hypertension Sister    Heart disease Sister      Current Outpatient Medications:    albuterol (VENTOLIN HFA) 108 (90 Base) MCG/ACT inhaler, TAKE 2 PUFFS BY MOUTH EVERY 6 HOURS AS NEEDED FOR WHEEZE OR SHORTNESS OF BREATH, Disp: 18 each, Rfl: 1   atorvastatin (LIPITOR) 10 MG tablet, One tab po qd, except Sunday, Disp: 30 tablet, Rfl: 11   cetirizine (ZYRTEC) 5 MG tablet, Take 1 tablet (5 mg total) by mouth daily., Disp: 30 tablet, Rfl: 1   diclofenac sodium (VOLTAREN) 1 % GEL, Apply 2 g to 4 g to affected joint up to 4 times daily PRN., Disp: 4 Tube, Rfl: 2    hydroxychloroquine (PLAQUENIL) 200 MG tablet, TAKE 1 TABLET BY MOUTH TWICE DAILY MONDAY THROUGH FRIDAY ONLY., Disp: 120 tablet, Rfl: 0   lidocaine (LIDODERM) 5 %, Place 1 patch onto the skin daily. Remove & Discard patch within 12 hours or as directed by MD, Disp: 30 patch, Rfl: 0   linaclotide (LINZESS) 72 MCG capsule, Take 1 capsule (72 mcg total) by mouth as needed., Disp: 30 capsule, Rfl: 1   NIFEdipine (ADALAT CC) 90 MG 24 hr tablet, Take 1 tablet (90 mg total) by mouth daily., Disp: 100 tablet, Rfl: 0   valsartan-hydrochlorothiazide (DIOVAN-HCT) 320-12.5 MG tablet, TAKE 1 TABLET BY MOUTH EVERY DAY IN THE MORNING, Disp: 90 tablet, Rfl: 1   aspirin 81 MG tablet, Take 81 mg by mouth daily. (Patient not taking: Reported on 07/28/2022), Disp: , Rfl:    No Known Allergies   Review of Systems  Constitutional: Negative.   Respiratory: Negative.    Cardiovascular: Negative.  Negative for palpitations.  Musculoskeletal:  Positive for neck pain (c/o neck pain. Denies UEweakness/tingling. Muscles feel tight. ).  Neurological: Negative.  Negative for headaches.  Psychiatric/Behavioral: Negative.       Today's Vitals   07/28/22 0959  BP: 128/72  Pulse: 68  Temp: 98.1 F (36.7 C)  SpO2: 98%  Weight: 183 lb 12.8 oz (83.4 kg)  Height: 5\' 4"  (1.626  m)   Body mass index is 31.55 kg/m.  Wt Readings from Last 3 Encounters:  07/28/22 183 lb 12.8 oz (83.4 kg)  05/19/22 181 lb (82.1 kg)  03/24/22 181 lb 3.2 oz (82.2 kg)    Objective:  Physical Exam Vitals and nursing note reviewed.  Constitutional:      Appearance: Normal appearance.  HENT:     Head: Normocephalic and atraumatic.     Nose:     Comments: Masked     Mouth/Throat:     Comments: Masked  Eyes:     Extraocular Movements: Extraocular movements intact.  Cardiovascular:     Rate and Rhythm: Normal rate and regular rhythm.     Heart sounds: Murmur heard.  Pulmonary:     Effort: Pulmonary effort is normal.     Breath sounds:  Normal breath sounds.  Musculoskeletal:     Cervical back: Normal range of motion.  Skin:    General: Skin is warm.  Neurological:     General: No focal deficit present.     Mental Status: She is alert.  Psychiatric:        Mood and Affect: Mood normal.        Behavior: Behavior normal.      Assessment And Plan:     1. Hypertensive heart disease without heart failure Comments: Chronic, controlled. She will c/w valsartan/hct 320/12.5mg  and nifedipine daily.  She will f/u in March 2024 for her next CPE, Sept '23 labs reviewed.  2. Atherosclerosis of aorta (HCC) Comments: Chronic, LDL goal <70. Also with carotid atherosclerosis. She reluctantly agrees to start atorvastatin 10mg  M-Sat. Also advised to start ASA 81mg  qd. F/u 6 wks.  3. Postnasal drip Comments: She is advised to avoid dairy for 1 week, I will send rx cetrizine 10mg  daily.  4. Vitamin D deficiency Comments: She is advised to take vitamin D3 2000 units once daily.  5. Class 1 obesity due to excess calories without serious comorbidity with body mass index (BMI) of 31.0 to 31.9 in adult Comments: She is encouraged to aim for at least 150 minutes of exercise per week, while striving for BMI<30 to decrease cardiac risk.  Patient was given opportunity to ask questions. Patient verbalized understanding of the plan and was able to repeat key elements of the plan. All questions were answered to their satisfaction.   I, , MD, have reviewed all documentation for this visit. The documentation on 07/28/22 for the exam, diagnosis, procedures, and orders are all accurate and complete.   IF YOU HAVE BEEN REFERRED TO A SPECIALIST, IT MAY TAKE 1-2 WEEKS TO SCHEDULE/PROCESS THE REFERRAL. IF YOU HAVE NOT HEARD FROM US/SPECIALIST IN TWO WEEKS, PLEASE GIVE A CALL AT [phone].   THE PATIENT IS ENCOURAGED TO PRACTICE SOCIAL DISTANCING DUE TO THE COVID-19 PANDEMIC.

## 2022-08-03 ENCOUNTER — Other Ambulatory Visit: Payer: Self-pay | Admitting: Internal Medicine

## 2022-08-03 DIAGNOSIS — Z1231 Encounter for screening mammogram for malignant neoplasm of breast: Secondary | ICD-10-CM

## 2022-08-05 ENCOUNTER — Ambulatory Visit: Payer: Medicare PPO | Admitting: Cardiology

## 2022-08-05 ENCOUNTER — Encounter: Payer: Self-pay | Admitting: Cardiology

## 2022-08-05 VITALS — BP 130/66 | HR 77 | Ht 64.0 in | Wt 182.0 lb

## 2022-08-05 DIAGNOSIS — I10 Essential (primary) hypertension: Secondary | ICD-10-CM

## 2022-08-05 DIAGNOSIS — I351 Nonrheumatic aortic (valve) insufficiency: Secondary | ICD-10-CM | POA: Diagnosis not present

## 2022-08-05 DIAGNOSIS — E7800 Pure hypercholesterolemia, unspecified: Secondary | ICD-10-CM

## 2022-08-05 DIAGNOSIS — R0989 Other specified symptoms and signs involving the circulatory and respiratory systems: Secondary | ICD-10-CM | POA: Diagnosis not present

## 2022-08-05 NOTE — Progress Notes (Signed)
Primary Physician:  Glendale Chard, MD   Patient ID: Jessica Booker, female    DOB: August 31, 1940, 81 y.o.   MRN: [id number]  Subjective:    Chief Complaint  Patient presents with   Hypertension   Moderate aortic regurgitation   Follow-up    1 year     HPI: Jessica Booker  is a 81 y.o. 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  This is annual visit, she is presently doing well and denies any chest pain or dyspnea.  She has had partial tendon tear in the left shoulder and has been in pain due to this.  Otherwise no other specific complaints.  Past Medical History:  Diagnosis Date   Asthma    Hypertension    Lupus ([place])     Past Surgical History:  Procedure Laterality Date   ABDOMINAL HYSTERECTOMY     CHOLECYSTECTOMY     SHOULDER ARTHROSCOPY W/ ROTATOR CUFF REPAIR     R shoulder   TONSILLECTOMY     Social History   Tobacco Use   Smoking status: Never    Passive exposure: Never   Smokeless tobacco: Never  Substance Use Topics   Alcohol use: No    Review of Systems  Cardiovascular:  Negative for chest pain, dyspnea on exertion and leg swelling.   Objective:  Blood pressure 130/66, pulse 77, height 5\' 4"  (1.626 m), weight 182 lb (82.6 kg), SpO2 98 %. Body mass index is 31.24 kg/m.     08/05/2022    8:31 AM 07/28/2022    9:59 AM 05/19/2022    7:56 AM  Vitals with BMI  Height 5\' 4"  5\' 4"  5' 4.5"  Weight 182 lbs 183 lbs 13 oz 181 lbs  BMI 31.22 [id number]  Systolic [id number]  Diastolic 66 72 70  Pulse 77 68 66      Physical Exam Constitutional:      Appearance: She is well-developed.  Neck:     Vascular: Carotid bruit (left) present. No JVD.  Cardiovascular:     Rate and Rhythm: Normal rate and regular rhythm.     Pulses: Normal pulses and intact distal  pulses.     Heart sounds: Murmur heard.     Crescendo-decrescendo mid to late systolic murmur is present with a grade of 3/6 at the apex.     Harsh early systolic murmur of grade 2/6 is also present at the upper right sternal border.  Pulmonary:     Effort: Pulmonary effort is normal. No accessory muscle usage or respiratory distress.     Breath sounds: Normal breath sounds.  Abdominal:     General: Bowel sounds are normal.     Palpations: Abdomen is soft.  Musculoskeletal:     Right lower leg: No edema.     Left lower leg: No edema.   Radiology: No results found.  Laboratory examination:       Latest Ref Rng & Units 05/19/2022    8:22 AM 12/23/2021   11:47 PM 11/26/2021   11:46 AM  CMP  Glucose 65 - 99 mg/dL 89  99  89   BUN 7 - 25 mg/dL 23  22  21    Creatinine 0.60 - 0.95 mg/dL 0.97  0.90  1.05   Sodium 135 - 146 mmol/L 138  140  143   Potassium 3.5 - 5.3 mmol/L 4.3  3.9  4.5   Chloride 98 - 110 mmol/L 105  106  106   CO2 20 - 32 mmol/L 26  25  22    Calcium 8.6 - 10.4 mg/dL 9.9  9.9  9.5   Total Protein 6.1 - 8.1 g/dL 8.0  7.7  7.6   Total Bilirubin 0.2 - 1.2 mg/dL 0.3  0.3    Alkaline Phos 38 - 126 U/L  74  93   AST 10 - 35 U/L 15  14  15    ALT 6 - 29 U/L 13  11  11        Latest Ref Rng & Units 05/19/2022    8:22 AM 12/23/2021   11:47 PM 11/26/2021   11:46 AM  CBC  WBC 3.8 - 10.8 Thousand/uL 6.2  9.9  6.6   Hemoglobin 11.7 - 15.5 g/dL 05/21/2022  12/25/2021  11/28/2021   Hematocrit 35.0 - 45.0 % 35.9  35.3  35.8   Platelets 140 - 400 Thousand/uL 293  252  294    Lipid Panel Recent Labs    11/26/21 1146  CHOL 135  TRIG 40  LDLCALC 70  HDL 55  CHOLHDL 2.5     HEMOGLOBIN A1C Lab Results  Component Value Date   HGBA1C 5.5 11/26/2021   Lab Results  Component Value Date   TSH 0.983 11/14/2019   No Known Allergies  Current Outpatient Medications:    albuterol (VENTOLIN HFA) 108 (90 Base) MCG/ACT inhaler, TAKE 2 PUFFS BY MOUTH EVERY 6 HOURS AS NEEDED FOR WHEEZE OR  SHORTNESS OF BREATH, Disp: 18 each, Rfl: 1   aspirin 81 MG tablet, Take 81 mg by mouth daily., Disp: , Rfl:    atorvastatin (LIPITOR) 10 MG tablet, One tab po qd, except Sunday, Disp: 30 tablet, Rfl: 11   cetirizine (ZYRTEC) 5 MG tablet, Take 1 tablet (5 mg total) by mouth daily., Disp: 30 tablet, Rfl: 1   Cholecalciferol (D3 2000 PO), Take by mouth., Disp: , Rfl:    hydroxychloroquine (PLAQUENIL) 200 MG tablet, TAKE 1 TABLET BY MOUTH TWICE DAILY MONDAY THROUGH FRIDAY ONLY., Disp: 120 tablet, Rfl: 0   linaclotide (LINZESS) 72 MCG capsule, Take 1 capsule (72 mcg total) by mouth as needed., Disp: 30 capsule, Rfl: 1   NIFEdipine (ADALAT CC) 90 MG 24 hr tablet, Take 1 tablet (90 mg total) by mouth daily., Disp: 100 tablet, Rfl: 0   valsartan-hydrochlorothiazide (DIOVAN-HCT) 320-12.5 MG tablet, TAKE 1 TABLET BY MOUTH EVERY DAY IN THE MORNING, Disp: 90 tablet, Rfl: 1   diclofenac sodium (VOLTAREN) 1 % GEL, Apply 2 g to 4 g to affected joint up to 4 times daily PRN., Disp: 4 Tube, Rfl: 2   lidocaine (LIDODERM) 5 %, Place 1 patch onto the skin daily. Remove & Discard patch within 12 hours or as directed by MD, Disp: 30 patch, Rfl: 0    Cardiac Studies:   Exercise myoview stress 12/02/2017: 1. The patient performed treadmill exercise using a Bruce protocol, completing 5 minutes. The patient completed an estimated workload of 7.03 METS, reaching 97% of the maximum predicted heart rate. Resting hypertension with exaggerated exercise response. Peak BP 248/48 mmHg. No stress symptoms reported. Fair exercise capacity.   No ischemic changes seen on stress electrocardiogram. Frequent PVC's seen at rest, and during exercise. 2. The overall quality of the study is excellent. There is no evidence of abnormal lung activity. Stress and rest SPECT images  demonstrate homogeneous tracer distribution throughout the myocardium. Gated SPECT imaging reveals normal myocardial thickening and wall motion. The left ventricular  ejection fraction was normal (72%).   3. Low risk study.  Carotid artery duplex 08/13/2021: Duplex suggests stenosis in the right internal carotid artery (minimal). Duplex suggests stenosis in the right external carotid artery (>50%). Duplex suggests stenosis in the left internal carotid artery (minimal). Antegrade right vertebral artery flow. Antegrade left vertebral artery flow. No significant change from 12/14/2017. Follow up is appropriate if clinically indicated.  PCV ECHOCARDIOGRAM COMPLETE 06/05/2020  Narrative Echocardiogram 06/05/2020: Normal LV systolic function with visual EF 60-65%. Left ventricle cavity is normal in size. Mild left ventricular hypertrophy. Normal global wall motion. Indeterminate diastolic filling pattern, elevated LAP. Mild to moderate aortic regurgitation. Aortic valve sclerosis without stenosis. Mild (Grade I) mitral regurgitation. Mild tricuspid regurgitation. Mild pulmonic valve stenosis. Compared to prior study dated 05/09/2019 no significant change.  ABI 08/11/2021: Normal bilateral ABI, right 1.10 with triphasic waveforms and left 1.06 with triphasic waveform.   EKG:  EKG 08/05/2022: Normal sinus rhythm at rate of 73 bpm, left atrial enlargement, left axis deviation, left anterior fascicular block.  Poor R progression, cannot exclude anteroseptal infarct old.  Compared to 05/29/2020, no change.  Assessment:     ICD-10-CM   1. Moderate aortic regurgitation  I35.1 EKG 12-Lead    PCV ECHOCARDIOGRAM COMPLETE    2. Primary hypertension  I10     3. Pure hypercholesterolemia  E78.00     4. Bruit of left carotid artery  R09.89 PCV CAROTID DUPLEX (BILATERAL)      No orders of the defined types were placed in this encounter.    There are no discontinued medications.   Recommendations:   Jessica Booker  is a 81 y.o. with  lupus arthritis, hypertension, mild obesity, hyperglycemia , exercise nuclear stress test in April 2019 that was considered  low risk study, moderate aortic regurgitation and MVP with mild to moderate mitral regurgitation.  1. Moderate aortic regurgitation Patient is here on an annual visit, remains asymptomatic from valvular heart disease standpoint.  No change in her aortic regurgitation murmur.  Do not think she needs repeat echocardiogram at this time although echocardiogram was done 2 years ago, will repeat echocardiogram in a year.  2. Primary hypertension Blood pressure under excellent control, she is tolerating all the medications well.  No side effects from Procardia and she is also on valsartan HCT.  Continue the same.  3. Pure hypercholesterolemia In view of abdominal aortic atherosclerosis and hypercholesterolemia and primary hypertension, she is presently on statin and lipids under excellent control. Labs reviewed.  No changes were done today.  4. Bruit of left carotid artery Prominent left carotid bruit there is unchanged from previous.  As I had performed carotid artery duplex last year and there was no significant disease although there was discordance with right external carotid stenosis left showed minimal disease, I would like to confirm stenosis by repeating carotid duplex again in 1 year and if repeat testing reveals no significant disease, no further evaluation is indicated.  Continue aspirin 81 mg daily.  Office visit in 1 year or sooner if problems.     Adrian Prows, MD, [facility] 08/05/2022, 8:59 AM Office: [phone] Pager: [phone]

## 2022-08-15 ENCOUNTER — Other Ambulatory Visit: Payer: Self-pay | Admitting: Cardiology

## 2022-08-15 DIAGNOSIS — I10 Essential (primary) hypertension: Secondary | ICD-10-CM

## 2022-08-15 DIAGNOSIS — I340 Nonrheumatic mitral (valve) insufficiency: Secondary | ICD-10-CM

## 2022-08-19 ENCOUNTER — Other Ambulatory Visit: Payer: Self-pay | Admitting: Internal Medicine

## 2022-08-25 ENCOUNTER — Other Ambulatory Visit: Payer: Self-pay

## 2022-08-25 DIAGNOSIS — I340 Nonrheumatic mitral (valve) insufficiency: Secondary | ICD-10-CM

## 2022-08-25 DIAGNOSIS — I10 Essential (primary) hypertension: Secondary | ICD-10-CM

## 2022-08-25 MED ORDER — VALSARTAN-HYDROCHLOROTHIAZIDE 320-12.5 MG PO TABS: ORAL_TABLET | ORAL | 1 refills | Status: DC

## 2022-08-31 ENCOUNTER — Encounter: Payer: Self-pay | Admitting: Nurse Practitioner

## 2022-08-31 ENCOUNTER — Ambulatory Visit: Payer: Medicare PPO | Admitting: Nurse Practitioner

## 2022-08-31 VITALS — BP 138/82 | HR 67 | Temp 98.6°F | Ht 64.0 in | Wt 182.0 lb

## 2022-08-31 DIAGNOSIS — F5101 Primary insomnia: Secondary | ICD-10-CM | POA: Diagnosis not present

## 2022-08-31 DIAGNOSIS — R051 Acute cough: Secondary | ICD-10-CM | POA: Diagnosis not present

## 2022-08-31 LAB — POC COVID19 BINAXNOW: SARS Coronavirus 2 Ag: NEGATIVE

## 2022-08-31 MED ORDER — AZITHROMYCIN 250 MG PO TABS: ORAL_TABLET | ORAL | 0 refills | Status: AC

## 2022-08-31 MED ORDER — HYDROCODONE BIT-HOMATROP MBR 5-1.5 MG/5ML PO SOLN: 5.0000 mL | Freq: Four times a day (QID) | ORAL | 0 refills | Status: DC | PRN

## 2022-08-31 MED ORDER — PREDNISONE 10 MG (21) PO TBPK: ORAL_TABLET | ORAL | 0 refills | Status: DC

## 2022-08-31 MED ORDER — CEFTRIAXONE SODIUM 1 G IJ SOLR
1.0000 g | Freq: Once | INTRAMUSCULAR | Status: AC
  Administered 2022-08-31: 1 g via INTRAMUSCULAR

## 2022-08-31 NOTE — Progress Notes (Signed)
I,Victoria T Hamilton,acting as a Education administrator for Minette Brine, FNP.,have documented all relevant documentation on the behalf of Minette Brine, FNP,as directed by  Minette Brine, FNP while in the presence of Minette Brine, [place].   Subjective:     Patient ID: Jessica Booker , female    DOB: [birth year] , 82 y.o.   MRN: [id number]   Chief Complaint  Patient presents with   URI    HPI  Patient presents today with cold symptoms.  She is experiencing coughing & wheezing.  She initially felt bad on late Thursday/Friday.  She has used cough syrup at home with honey in it two times a day. She has rubbed vicks vaporub to her chest.  She also has used her albuterol inhaler as needed, has used 3 times. Patient states it does help.  She has a history of lupus   URI  This is a new problem. The current episode started in the past 7 days. There has been no fever. Associated symptoms include coughing (white secretions). Pertinent negatives include no chest pain, congestion or nausea. She has tried nothing for the symptoms.     Past Medical History:  Diagnosis Date   Asthma    Hypertension    Lupus ([place])      Family History  Problem Relation Age of Onset   Heart attack Mother    Stomach cancer Father    Hypertension Sister    Heart disease Sister      Current Outpatient Medications:    albuterol (VENTOLIN HFA) 108 (90 Base) MCG/ACT inhaler, TAKE 2 PUFFS BY MOUTH EVERY 6 HOURS AS NEEDED FOR WHEEZE OR SHORTNESS OF BREATH, Disp: 18 each, Rfl: 1   aspirin 81 MG tablet, Take 81 mg by mouth daily., Disp: , Rfl:    atorvastatin (LIPITOR) 10 MG tablet, One tab po qd, except Sunday, Disp: 30 tablet, Rfl: 11   azithromycin (ZITHROMAX) 250 MG tablet, Take 2 tablets (500 mg) on  Day 1,  followed by 1 tablet (250 mg) once daily on Days 2 through 5., Disp: 6 each, Rfl: 0   cetirizine (ZYRTEC) 5 MG tablet, TAKE 1 TABLET (5 MG TOTAL) BY MOUTH DAILY., Disp: 90 tablet, Rfl: 1   Cholecalciferol (D3 2000 PO),  Take by mouth., Disp: , Rfl:    diclofenac sodium (VOLTAREN) 1 % GEL, Apply 2 g to 4 g to affected joint up to 4 times daily PRN., Disp: 4 Tube, Rfl: 2   HYDROcodone bit-homatropine (HYDROMET) 5-1.5 MG/5ML syrup, Take 5 mLs by mouth every 6 (six) hours as needed for cough., Disp: 120 mL, Rfl: 0   hydroxychloroquine (PLAQUENIL) 200 MG tablet, TAKE 1 TABLET BY MOUTH TWICE DAILY MONDAY THROUGH FRIDAY ONLY., Disp: 120 tablet, Rfl: 0   lidocaine (LIDODERM) 5 %, Place 1 patch onto the skin daily. Remove & Discard patch within 12 hours or as directed by MD, Disp: 30 patch, Rfl: 0   linaclotide (LINZESS) 72 MCG capsule, Take 1 capsule (72 mcg total) by mouth as needed., Disp: 30 capsule, Rfl: 1   predniSONE (STERAPRED UNI-PAK 21 TAB) 10 MG (21) TBPK tablet, Take as directed, Disp: 21 tablet, Rfl: 0   valsartan-hydrochlorothiazide (DIOVAN-HCT) 320-12.5 MG tablet, TAKE 1 TABLET BY MOUTH EVERY DAY IN THE MORNING, Disp: 90 tablet, Rfl: 1   NIFEdipine (ADALAT CC) 90 MG 24 hr tablet, Take 1 tablet (90 mg total) by mouth daily. (Patient not taking: Reported on 08/31/2022), Disp: 100 tablet, Rfl: 0   No Known Allergies  Review of Systems  Constitutional: Negative.   HENT:  Negative for congestion.   Respiratory:  Positive for cough (white secretions).   Cardiovascular: Negative.  Negative for chest pain.  Gastrointestinal:  Negative for nausea.  Neurological: Negative.   Psychiatric/Behavioral: Negative.       Today's Vitals   08/31/22 1619  BP: 138/82  Pulse: 67  Temp: 98.6 F (37 C)  SpO2: 98%  Weight: 182 lb (82.6 kg)  Height: 5\' 4"  (1.626 m)   Body mass index is 31.24 kg/m.  Wt Readings from Last 3 Encounters:  08/31/22 182 lb (82.6 kg)  08/05/22 182 lb (82.6 kg)  07/28/22 183 lb 12.8 oz (83.4 kg)    Objective:  Physical Exam Vitals reviewed.  Constitutional:      General: She is not in acute distress.    Appearance: Normal appearance. She is obese.  Cardiovascular:     Pulses:  Normal pulses.     Heart sounds: Normal heart sounds. No murmur heard. Pulmonary:     Effort: Pulmonary effort is normal. No respiratory distress.     Breath sounds: Normal breath sounds. No wheezing.  Neurological:     General: No focal deficit present.     Mental Status: She is alert and oriented to person, place, and time.     Cranial Nerves: No cranial nerve deficit.     Motor: No weakness.         Assessment And Plan: Encounter     1. Acute cough Comments: Will treat with 1 g of Rocephin.  Will send a Z-Pak and prednisone taper.  Will send Hydromet to take when at home avoid driving with med.  Can take half a dose - cefTRIAXone (ROCEPHIN) injection 1 g - predniSONE (STERAPRED UNI-PAK 21 TAB) 10 MG (21) TBPK tablet; Take as directed  Dispense: 21 tablet; Refill: 0 - azithromycin (ZITHROMAX) 250 MG tablet; Take 2 tablets (500 mg) on  Day 1,  followed by 1 tablet (250 mg) once daily on Days 2 through 5.  Dispense: 6 each; Refill: 0 - HYDROcodone bit-homatropine (HYDROMET) 5-1.5 MG/5ML syrup; Take 5 mLs by mouth every 6 (six) hours as needed for cough.  Dispense: 120 mL; Refill: 0  2. Primary insomnia Comments: May be related to grieving for her to passed away 1 year ago in combination with frequent coughing.  Encouraged to take melatonin capsules   Patient was given opportunity to ask questions. Patient verbalized understanding of the plan and was able to repeat key elements of the plan. All questions were answered to their satisfaction.  Minette Brine, FNP   I, Minette Brine, FNP, have reviewed all documentation for this visit. The documentation on 08/31/22 for the exam, diagnosis, procedures, and orders are all accurate and complete.   IF YOU HAVE BEEN REFERRED TO A SPECIALIST, IT MAY TAKE 1-2 WEEKS TO SCHEDULE/PROCESS THE REFERRAL. IF YOU HAVE NOT HEARD FROM US/SPECIALIST IN TWO WEEKS, PLEASE GIVE [place] A CALL AT [phone].   THE PATIENT IS ENCOURAGED TO PRACTICE SOCIAL  DISTANCING DUE TO THE COVID-19 PANDEMIC.

## 2022-08-31 NOTE — Addendum Note (Signed)
Addended by: Azalee Course T on: 08/31/2022 05:15 PM   Modules accepted: Orders

## 2022-08-31 NOTE — Patient Instructions (Signed)

## 2022-08-31 NOTE — Addendum Note (Signed)
Addended by: Azalee Course T on: 08/31/2022 05:14 PM   Modules accepted: Orders

## 2022-09-02 LAB — RESPIRATORY PANEL W/ SARS-COV2
Adenovirus: NOT DETECTED
Bordetella parapertussis: NOT DETECTED
Bordetella pertussis: NOT DETECTED
Chlamydophila pneumoniae: NOT DETECTED
Coronavirus 229E: NOT DETECTED
Coronavirus HKU1: NOT DETECTED
Coronavirus NL63: NOT DETECTED
Coronavirus OC43: NOT DETECTED
Human Metapneumovirus: NOT DETECTED
Human Rhinovirus/Enterovirus: NOT DETECTED
Influenza A/H1-2009: DETECTED — AB
Influenza A/H1: NOT DETECTED
Influenza A/H3: NOT DETECTED
Influenza A: DETECTED — AB
Influenza B: NOT DETECTED
Mycoplasma pneumoniae: NOT DETECTED
Parainfluenza 1: NOT DETECTED
Parainfluenza 2: NOT DETECTED
Parainfluenza 3: NOT DETECTED
Parainfluenza 4: NOT DETECTED
Respiratory Syncytial Virus: NOT DETECTED
SARS-CoV-2: NOT DETECTED

## 2022-09-08 ENCOUNTER — Encounter: Payer: Self-pay | Admitting: Internal Medicine

## 2022-09-08 ENCOUNTER — Ambulatory Visit: Payer: Medicare PPO | Admitting: Internal Medicine

## 2022-09-08 VITALS — BP 130/64 | HR 64 | Temp 98.8°F | Ht 60.6 in | Wt 184.8 lb

## 2022-09-08 DIAGNOSIS — E559 Vitamin D deficiency, unspecified: Secondary | ICD-10-CM

## 2022-09-08 DIAGNOSIS — Z6835 Body mass index (BMI) 35.0-35.9, adult: Secondary | ICD-10-CM | POA: Diagnosis not present

## 2022-09-08 DIAGNOSIS — E7800 Pure hypercholesterolemia, unspecified: Secondary | ICD-10-CM | POA: Diagnosis not present

## 2022-09-08 DIAGNOSIS — I700 Atherosclerosis of aorta: Secondary | ICD-10-CM | POA: Diagnosis not present

## 2022-09-08 NOTE — Progress Notes (Signed)
Rich Brave [place],acting as a Education administrator for Maximino Greenland, MD.,have documented all relevant documentation on the behalf of Maximino Greenland, MD,as directed by  Maximino Greenland, MD while in the presence of Maximino Greenland, MD.    Subjective:     Patient ID: Jessica Booker , female    DOB: [birth year] , 82 y.o.   MRN: [id number]   Chief Complaint  Patient presents with   Hyperlipidemia    HPI  Patient presents today for a 6 week f/u on her chol. She was started on atorvastatin 10mg  daily.  She has not had any issues with the medication. She adds that she was diagnosed with the flu last week, she took Tamiflu with resolution of her symptoms.      Past Medical History:  Diagnosis Date   Asthma    Hypertension    Lupus ([place])      Family History  Problem Relation Age of Onset   Heart attack Mother    Stomach cancer Father    Hypertension Sister    Heart disease Sister      Current Outpatient Medications:    albuterol (VENTOLIN HFA) 108 (90 Base) MCG/ACT inhaler, TAKE 2 PUFFS BY MOUTH EVERY 6 HOURS AS NEEDED FOR WHEEZE OR SHORTNESS OF BREATH, Disp: 18 each, Rfl: 1   aspirin 81 MG tablet, Take 81 mg by mouth daily., Disp: , Rfl:    atorvastatin (LIPITOR) 10 MG tablet, One tab po qd, except Sunday, Disp: 30 tablet, Rfl: 11   cetirizine (ZYRTEC) 5 MG tablet, TAKE 1 TABLET (5 MG TOTAL) BY MOUTH DAILY., Disp: 90 tablet, Rfl: 1   Cholecalciferol (D3 2000 PO), Take by mouth., Disp: , Rfl:    diclofenac sodium (VOLTAREN) 1 % GEL, Apply 2 g to 4 g to affected joint up to 4 times daily PRN., Disp: 4 Tube, Rfl: 2   hydroxychloroquine (PLAQUENIL) 200 MG tablet, TAKE 1 TABLET BY MOUTH TWICE DAILY MONDAY THROUGH FRIDAY ONLY., Disp: 120 tablet, Rfl: 0   lidocaine (LIDODERM) 5 %, Place 1 patch onto the skin daily. Remove & Discard patch within 12 hours or as directed by MD, Disp: 30 patch, Rfl: 0   linaclotide (LINZESS) 72 MCG capsule, Take 1 capsule (72 mcg total) by mouth as needed., Disp:  30 capsule, Rfl: 1   NIFEdipine (ADALAT CC) 90 MG 24 hr tablet, Take 1 tablet (90 mg total) by mouth daily., Disp: 100 tablet, Rfl: 0   valsartan-hydrochlorothiazide (DIOVAN-HCT) 320-12.5 MG tablet, TAKE 1 TABLET BY MOUTH EVERY DAY IN THE MORNING, Disp: 90 tablet, Rfl: 1   No Known Allergies   Review of Systems  Constitutional: Negative.   Eyes: Negative.   Respiratory: Negative.    Cardiovascular: Negative.   Gastrointestinal: Negative.   Musculoskeletal: Negative.   Skin: Negative.   Neurological: Negative.   Psychiatric/Behavioral: Negative.       Today's Vitals   09/08/22 0944  BP: 130/64  Pulse: 64  Temp: 98.8 F (37.1 C)  Weight: 184 lb 12.8 oz (83.8 kg)  Height: 5' 0.6" (1.539 m)  PainSc: 0-No pain   Body mass index is 35.38 kg/m.  Wt Readings from Last 3 Encounters:  09/08/22 184 lb 12.8 oz (83.8 kg)  08/31/22 182 lb (82.6 kg)  08/05/22 182 lb (82.6 kg)     Objective:  Physical Exam Vitals and nursing note reviewed.  Constitutional:      Appearance: Normal appearance.  HENT:     Head: Normocephalic and  atraumatic.     Nose:     Comments: Masked     Mouth/Throat:     Comments: Masked  Eyes:     Extraocular Movements: Extraocular movements intact.  Cardiovascular:     Rate and Rhythm: Normal rate and regular rhythm.     Heart sounds: Normal heart sounds.  Pulmonary:     Effort: Pulmonary effort is normal.     Breath sounds: Normal breath sounds.  Musculoskeletal:     Cervical back: Normal range of motion.  Skin:    General: Skin is warm.  Neurological:     General: No focal deficit present.     Mental Status: She is alert.  Psychiatric:        Mood and Affect: Mood normal.        Behavior: Behavior normal.       Assessment And Plan:     1. Pure hypercholesterolemia Comments: Chronic, LDL goal is < 70 due to underlying aortic atherosclerosis. For now, she will c/w atorvastatin 10mg  daily.  F/u April 2024 for CPE. - Lipid panel - ALT  2.  Atherosclerosis of aorta (HCC) Comments: Please see #1. - Lipid panel - ALT  3. Vitamin D deficiency Comments: I will check a vitamin D level and supplement as needed. - Vitamin D (25 hydroxy)  4. Class 2 severe obesity due to excess calories with serious comorbidity and body mass index (BMI) of 35.0 to 35.9 in adult [facility]) Comments: She is encouraged to aim for at least 150 minutes of exercise/week, while striving for BMI<30 to decrease cardiac risk.    Patient was given opportunity to ask questions. Patient verbalized understanding of the plan and was able to repeat key elements of the plan. All questions were answered to their satisfaction.    I, Maximino Greenland, MD, have reviewed all documentation for this visit. The documentation on 09/08/22 for the exam, diagnosis, procedures, and orders are all accurate and complete.   IF YOU HAVE BEEN REFERRED TO A SPECIALIST, IT MAY TAKE 1-2 WEEKS TO SCHEDULE/PROCESS THE REFERRAL. IF YOU HAVE NOT HEARD FROM US/SPECIALIST IN TWO WEEKS, PLEASE GIVE [place] A CALL AT [phone].   THE PATIENT IS ENCOURAGED TO PRACTICE SOCIAL DISTANCING DUE TO THE COVID-19 PANDEMIC.

## 2022-09-08 NOTE — Patient Instructions (Signed)
Mediterranean Diet ?A Mediterranean diet refers to food and lifestyle choices that are based on the traditions of countries located on the [place]. It focuses on eating more fruits, vegetables, whole grains, beans, nuts, seeds, and heart-healthy fats, and eating less dairy, meat, eggs, and processed foods with added sugar, salt, and fat. This way of eating has been shown to help prevent certain conditions and improve outcomes for people who have chronic diseases, like kidney disease and heart disease. ?What are tips for following this plan? ?Reading food labels ?Check the serving size of packaged foods. For foods such as rice and pasta, the serving size refers to the amount of cooked product, not dry. ?Check the total fat in packaged foods. Avoid foods that have saturated fat or trans fats. ?Check the ingredient list for added sugars, such as corn syrup. ?Shopping ? ?Buy a variety of foods that offer a balanced diet, including: ?Fresh fruits and vegetables (produce). ?Grains, beans, nuts, and seeds. Some of these may be available in unpackaged forms or large amounts (in bulk). ?Fresh seafood. ?Poultry and eggs. ?Low-fat dairy products. ?Buy whole ingredients instead of prepackaged foods. ?Buy fresh fruits and vegetables in-season from local farmers markets. ?Buy plain frozen fruits and vegetables. ?If you do not have access to quality fresh seafood, buy precooked frozen shrimp or canned fish, such as tuna, salmon, or sardines. ?Stock your pantry so you always have certain foods on hand, such as olive oil, canned tuna, canned tomatoes, rice, pasta, and beans. ?Cooking ?Cook foods with extra-virgin olive oil instead of using butter or other vegetable oils. ?Have meat as a side dish, and have vegetables or grains as your main dish. This means having meat in small portions or adding small amounts of meat to foods like pasta or stew. ?Use beans or vegetables instead of meat in common dishes like chili or  lasagna. ?Experiment with different cooking methods. Try roasting, broiling, steaming, and saut?ing vegetables. ?Add frozen vegetables to soups, stews, pasta, or rice. ?Add nuts or seeds for added healthy fats and plant protein at each meal. You can add these to yogurt, salads, or vegetable dishes. ?Marinate fish or vegetables using olive oil, lemon juice, garlic, and fresh herbs. ?Meal planning ?Plan to eat one vegetarian meal one day each week. Try to work up to two vegetarian meals, if possible. ?Eat seafood two or more times a week. ?Have healthy snacks readily available, such as: ?Vegetable sticks with hummus. ?Greek yogurt. ?Fruit and nut trail mix. ?Eat balanced meals throughout the week. This includes: ?Fruit: 2-3 servings a day. ?Vegetables: 4-5 servings a day. ?Low-fat dairy: 2 servings a day. ?Fish, poultry, or lean meat: 1 serving a day. ?Beans and legumes: 2 or more servings a week. ?Nuts and seeds: 1-2 servings a day. ?Whole grains: 6-8 servings a day. ?Extra-virgin olive oil: 3-4 servings a day. ?Limit red meat and sweets to only a few servings a month. ?Lifestyle ? ?Cook and eat meals together with your family, when possible. ?Drink enough fluid to keep your urine pale yellow. ?Be physically active every day. This includes: ?Aerobic exercise like running or swimming. ?Leisure activities like gardening, walking, or housework. ?Get 7-8 hours of sleep each night. ?If recommended by your health care provider, drink red wine in moderation. This means 1 glass a day for nonpregnant women and 2 glasses a day for men. A glass of wine equals 5 oz (150 mL). ?What foods should I eat? ?Fruits ?Apples. Apricots. Avocado. Berries. Bananas. Cherries. Dates.   Figs. Grapes. Lemons. Melon. Oranges. Peaches. Plums. Pomegranate. ?Vegetables ?Artichokes. Beets. Broccoli. Cabbage. Carrots. Eggplant. Green beans. Chard. Kale. Spinach. Onions. Leeks. Peas. Squash. Tomatoes. Peppers. Radishes. ?Grains ?Whole-grain pasta. Brown  rice. Bulgur wheat. Polenta. Couscous. Whole-wheat bread. Oatmeal. Quinoa. ?Meats and other proteins ?Beans. Almonds. Sunflower seeds. Pine nuts. Peanuts. Cod. Salmon. Scallops. Shrimp. Tuna. Tilapia. Clams. Oysters. Eggs. Poultry without skin. ?Dairy ?Low-fat milk. Cheese. Greek yogurt. ?Fats and oils ?Extra-virgin olive oil. Avocado oil. Grapeseed oil. ?Beverages ?Water. Red wine. Herbal tea. ?Sweets and desserts ?Greek yogurt with honey. Baked apples. Poached pears. Trail mix. ?Seasonings and condiments ?Basil. Cilantro. Coriander. Cumin. Mint. Parsley. Sage. Rosemary. Tarragon. Garlic. Oregano. Thyme. Pepper. Balsamic vinegar. Tahini. Hummus. Tomato sauce. Olives. Mushrooms. ?The items listed above may not be a complete list of foods and beverages you can eat. Contact a dietitian for more information. ?What foods should I limit? ?This is a list of foods that should be eaten rarely or only on special occasions. ?Fruits ?Fruit canned in syrup. ?Vegetables ?Deep-fried potatoes (french fries). ?Grains ?Prepackaged pasta or rice dishes. Prepackaged cereal with added sugar. Prepackaged snacks with added sugar. ?Meats and other proteins ?Beef. Pork. Lamb. Poultry with skin. Hot dogs. Bacon. ?Dairy ?Ice cream. Sour cream. Whole milk. ?Fats and oils ?Butter. Canola oil. Vegetable oil. Beef fat (tallow). Lard. ?Beverages ?Juice. Sugar-sweetened soft drinks. Beer. Liquor and spirits. ?Sweets and desserts ?Cookies. Cakes. Pies. Candy. ?Seasonings and condiments ?Mayonnaise. Pre-made sauces and marinades. ?The items listed above may not be a complete list of foods and beverages you should limit. Contact a dietitian for more information. ?Summary ?The Mediterranean diet includes both food and lifestyle choices. ?Eat a variety of fresh fruits and vegetables, beans, nuts, seeds, and whole grains. ?Limit the amount of red meat and sweets that you eat. ?If recommended by your health care provider, drink red wine in moderation.  This means 1 glass a day for nonpregnant women and 2 glasses a day for men. A glass of wine equals 5 oz (150 mL). ?This information is not intended to replace advice given to you by your health care provider. Make sure you discuss any questions you have with your health care provider. ?Document Revised: 09/21/2019 Document Reviewed: 07/19/2019 ?Elsevier Patient Education ? 2023 Elsevier Inc[employer] ? ?

## 2022-09-09 LAB — LIPID PANEL
Chol/HDL Ratio: 1.9 ratio (ref 0.0–4.4)
Cholesterol, Total: 127 mg/dL (ref 100–199)
HDL: 66 mg/dL (ref >39)
LDL Chol Calc (NIH): 51 mg/dL (ref 0–99)
Triglycerides: 39 mg/dL (ref 0–149)
VLDL Cholesterol Cal: 10 mg/dL (ref 5–40)

## 2022-09-09 LAB — ALT: ALT: 11 IU/L (ref 0–32)

## 2022-09-09 LAB — VITAMIN D 25 HYDROXY (VIT D DEFICIENCY, FRACTURES): Vit D, 25-Hydroxy: 44.1 ng/mL (ref 30.0–100.0)

## 2022-09-28 ENCOUNTER — Ambulatory Visit
Admission: RE | Admit: 2022-09-28 | Discharge: 2022-09-28 | Disposition: A | Discharge status: Home / Self Care | Payer: Medicare PPO | Source: Ambulatory Visit | Attending: Internal Medicine | Admitting: Internal Medicine

## 2022-09-28 DIAGNOSIS — Z1231 Encounter for screening mammogram for malignant neoplasm of breast: Secondary | ICD-10-CM | POA: Diagnosis not present

## 2022-10-21 NOTE — Progress Notes (Signed)
Office Visit Note  Patient: Jessica Booker             Date of Birth: 1941/02/14           MRN: [id number]             PCP: Glendale Chard, MD Referring: Glendale Chard, MD Visit Date: 11/04/2022 Occupation: '@GUAROCC'$ @  Subjective:  Left shoulder pain  History of Present Illness: Jessica Booker is a 82 y.o. female history of systemic lupus erythematosus, osteoarthritis and degenerative disc disease.  She states she continues to have neck and lower back discomfort.  She had MRI done by Dr. Marlou Sa on June 08, 2022 which showed multilevel spondylosis and foraminal stenosis.  She also continues to have pain and discomfort in her left shoulder joint and had MRI of the left shoulder on May 20, 2022 which showed partial-thickness tear and some mild to moderate degenerative changes.  Left subacromial injection in April 2023.  She continues to have pain and discomfort in her bilateral shoulders.  She states her left trochanteric bursitis has improved.  She continues to have pain and discomfort in her bilateral hands and her knee joints.  She describes some discomfort in her left knee when she gets out of the car.  Her back pain is manageable currently.  She continues to have dry mouth symptoms.  She denies history of oral ulcers, nasal ulcers, malar rash, photosensitivity, Raynaud's phenomenon or lymphadenopathy.  She has been taking hydroxychloroquine 200 mg p.o. twice daily Monday to Friday on a regular basis.    Activities of Daily Living:  Patient reports morning stiffness for 0 minutes.   Patient Reports nocturnal pain.  Difficulty dressing/grooming: Denies Difficulty climbing stairs: Denies Difficulty getting out of chair: Denies Difficulty using hands for taps, buttons, cutlery, and/or writing: Denies  Review of Systems  Constitutional: Negative.  Negative for fatigue.  HENT:  Positive for mouth dryness. Negative for mouth sores.   Eyes: Negative.  Negative for dryness.   Respiratory: Negative.  Negative for shortness of breath.   Cardiovascular: Negative.  Negative for chest pain and palpitations.  Gastrointestinal: Negative.  Negative for blood in stool, constipation and diarrhea.  Endocrine: Negative.  Negative for increased urination.  Genitourinary: Negative.  Negative for involuntary urination.  Musculoskeletal:  Positive for joint pain, joint pain, joint swelling, myalgias, muscle weakness, muscle tenderness and myalgias. Negative for gait problem and morning stiffness.  Skin:  Positive for hair loss. Negative for color change, rash and sensitivity to sunlight.  Allergic/Immunologic: Negative.  Negative for susceptible to infections.  Neurological: Negative.  Negative for dizziness and headaches.  Hematological: Negative.  Negative for swollen glands.  Psychiatric/Behavioral:  Positive for sleep disturbance. Negative for depressed mood. The patient is not nervous/anxious.     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   Dyspnea 04/11/2019   Belching 04/11/2019   Constipation 04/11/2019   Essential hypertension 11/02/2018   Lupus ([place]) 11/02/2018   Primary osteoarthritis of both knees 06/06/2018   Cough variant asthma 08/25/2017   Systemic lupus erythematosus ([place]) 02/18/2017   High risk medication use 02/18/2017   Raynaud's disease without gangrene 02/18/2017   DDD (degenerative disc disease), lumbar 02/18/2017   Primary osteoarthritis of both hands 02/18/2017   Primary osteoarthritis of both feet 02/18/2017   Vitamin D deficiency 02/18/2017   History of hypertension 02/18/2017   History of neuropathy 02/18/2017    Past Medical History:  Diagnosis Date   Asthma    Hypertension    Lupus ([place])     Family History  Problem Relation Age of Onset   Heart attack Mother    Stomach cancer Father    Hypertension Sister    Heart disease Sister    Past Surgical History:  Procedure  Laterality Date   ABDOMINAL HYSTERECTOMY     CHOLECYSTECTOMY     SHOULDER ARTHROSCOPY W/ ROTATOR CUFF REPAIR     R shoulder   TONSILLECTOMY     Social History   Social History Narrative   Not on file   Immunization History  Administered Date(s) Administered   Fluad Quad(high Dose 65+) 05/27/2021   Influenza, High Dose Seasonal PF 06/21/2017, 05/29/2018, 05/16/2019   Influenza-Unspecified 05/30/2018, 05/16/2019, 05/13/2020, 06/14/2022   PFIZER Comirnaty(Gray Top)Covid-19 Tri-Sucrose Vaccine 12/25/2020   PFIZER(Purple Top)SARS-COV-2 Vaccination 10/04/2019, 10/30/2019, 05/23/2020   Pfizer Covid-19 Vaccine Bivalent Booster 38yr & up 07/19/2021, 07/12/2022   Pneumococcal Conjugate-13 07/06/2019   Pneumococcal Polysaccharide-23 09/17/2013   Zoster Recombinat (Shingrix) 05/29/2018, 08/01/2018   Zoster, Live 10/17/2020     Objective: Vital Signs: BP 131/67 (BP Location: Left Arm, Patient Position: Sitting, Cuff Size: Normal)   Pulse 69   Resp 16   Ht 5' 0.5" (1.537 m)   Wt 185 lb 3.2 oz (84 kg)   BMI 35.57 kg/m    Physical Exam Vitals and nursing note reviewed.  Constitutional:      Appearance: She is well-developed.  HENT:     Head: Normocephalic and atraumatic.  Eyes:     Conjunctiva/sclera: Conjunctivae normal.  Cardiovascular:     Rate and Rhythm: Normal rate and regular rhythm.     Heart sounds: Murmur heard.  Pulmonary:     Effort: Pulmonary effort is normal.     Breath sounds: Normal breath sounds.  Abdominal:     General: Bowel sounds are normal.     Palpations: Abdomen is soft.  Musculoskeletal:     Cervical back: Normal range of motion.  Lymphadenopathy:     Cervical: No cervical adenopathy.  Skin:    General: Skin is warm and dry.     Capillary Refill: Capillary refill takes less than 2 seconds.  Neurological:     Mental Status: She is alert and oriented to person, place, and time.  Psychiatric:        Behavior: Behavior normal.       Musculoskeletal Exam: She has some discomfort with lateral rotation of the cervical spine.  She painful limited range of motion of her left shoulder joint to 90 degrees.  Right shoulder joint was in full range of motion with some discomfort.  Elbow joints, wrist joints, MCPs PIPs and DIPs were in good range of motion.  She had subluxation of her bilateral index finger DIP joints with no synovitis.  Hip joints and knee joints were in good range of motion without any warmth swelling or effusion.  There was no tenderness over ankles or MTPs.  CDAI Exam: CDAI Score: -- Patient Global: --; Provider Global: -- Swollen: --; Tender: -- Joint Exam 11/04/2022   No joint exam has been documented for this visit   There is currently no information documented on the homunculus. Go to the Rheumatology activity and complete the homunculus joint exam.  Investigation: No additional findings.  Imaging: No results found.  Recent Labs: Lab Results  Component Value Date   WBC 6.2 05/19/2022   HGB 11.7 05/19/2022   PLT 293 05/19/2022  NA 138 05/19/2022   K 4.3 05/19/2022   CL 105 05/19/2022   CO2 26 05/19/2022   GLUCOSE 89 05/19/2022   BUN 23 05/19/2022   CREATININE 0.97 (H) 05/19/2022   BILITOT 0.3 05/19/2022   ALKPHOS 74 12/23/2021   AST 15 05/19/2022   ALT 11 09/08/2022   PROT 8.0 05/19/2022   ALBUMIN 4.2 12/23/2021   CALCIUM 9.9 05/19/2022   GFRAA 73 06/27/2020    Speciality Comments: PLQ Eye Exam: 02/22/2022 WNL @  Opthamology Follow up in 1 year.   Procedures:  Large Joint Inj: L subacromial bursa on 11/04/2022 8:27 AM Indications: pain Details: 27 G 1.5 in needle, posterior approach  Arthrogram: No  Medications: 1 mL lidocaine 1 %; 40 mg triamcinolone acetonide 40 MG/ML Aspirate: 0 mL Outcome: tolerated well, no immediate complications Procedure, treatment alternatives, risks and benefits explained, specific risks discussed. Consent was given by the patient.  Immediately prior to procedure a time out was called to verify the correct patient, procedure, equipment, support staff and site/side marked as required. Patient was prepped and draped in the usual sterile fashion.     Allergies: Patient has no known allergies.   Assessment / Plan:     Visit Diagnoses: Other systemic lupus erythematosus with other organ involvement (HCC) - ANA, positive Smith, and positive Ro and RNP: Patient denies any history of oral ulcers, nasal ulcers, malar rash, photosensitivity, Raynaud's, lymphadenopathy.  She gives history of fatigue and dry mouth which she relates to medication use.  There is no history of inflammatory arthritis.  She continues to have discomfort in her left shoulder joint.  She had MRI of her left shoulder joint by Dr. Marlou Sa which showed partial-thickness tear and some degenerative changes.  Labs obtained on May 19, 2022 were reviewed.  ANA was positive, double-stranded DNA was negative, complements were normal and sed rate was normal.  Will check autoimmune labs today.  High risk medication use - Plaquenil 200 mg 1 tablet by mouth twice daily M-F.  CBC and CMP were normal.  Eye examination was normal on February 22, 2022.  Advised patient to get eye examination in June 2024.  Will check CBC and CMP today.  Raynaud's disease without gangrene-currently not active.  No nailbed capillary changes or digital ulcers were noted.  Chronic left shoulder pain - She is currently under the care of Dr. Marlou Sa.  X-rays of the left shoulder on 07/31/2021 were consistent with [facility] joint arthritis.  MRI of the shoulder joint from May 20, 2022 showed partial-thickness tear, mild degenerative changes and moderate supraspinatus muscle atrophy.  She continues to have pain and discomfort in the left shoulder joint.  She had difficulty raising her arm and had tenderness in the subacromial region.  After informed consent was obtained and different treatment options were discussed  left subacromial bursa was injected with lidocaine and Kenalog as described above.  Patient had immediate relief after the injection.  Postprocedure instructions were given.  A handout on shoulder exercises was given.  Patient declined physical therapy.  She has been doing water exercises.  Primary osteoarthritis of both hands-bilateral PIP and DIP thickening.  No synovitis was noted.  Joint protection muscle strengthening was discussed.  Primary osteoarthritis of both knees-she is off and on disc  Trochanteric bursitis, left hip-resolved.  Primary osteoarthritis of both feet-she denies any discomfort in her feet today.  DDD (degenerative disc disease), cervical - Multilevel severe spondylosis was noted.  Patient had MRI of the cervical spine  which showed multilevel spondylosis.  Range of motion exercises were advised.  A handout was placed in the AVS.  DDD (degenerative disc disease), lumbar-she denies any lower back pain today.  History of neuropathy  Vitamin D deficiency-vitamin D was normal at 44.1 on September 08, 2022.  History of hypertension-blood pressure was normal today at 131/67.  Moderate aortic valve insufficiency-patient has a heart murmur and is followed by Dr. Einar Gip.  Pure hypercholesterolemia-she is on Lipitor 10 mg p.o. daily.  Orders: Orders Placed This Encounter  Procedures   Large Joint Inj   Protein / creatinine ratio, urine   CBC with Differential/Platelet   COMPLETE METABOLIC PANEL WITH GFR   Anti-DNA antibody, double-stranded   C3 and C4   Sedimentation rate   No orders of the defined types were placed in this encounter.    Follow-Up Instructions: Return in about 5 months (around 04/06/2023) for Systemic lupus, Osteoarthritis.   Bo Merino, MD  Note - This record has been created using Editor, commissioning.  Chart creation errors have been sought, but may not always  have been located. Such creation errors do not reflect on  the standard of  medical care.

## 2022-11-04 ENCOUNTER — Encounter: Payer: Self-pay | Admitting: Rheumatology

## 2022-11-04 ENCOUNTER — Ambulatory Visit: Payer: Medicare PPO | Attending: Rheumatology | Admitting: Rheumatology

## 2022-11-04 VITALS — BP 131/67 | HR 69 | Resp 16 | Ht 60.5 in | Wt 185.2 lb

## 2022-11-04 DIAGNOSIS — I7300 Raynaud's syndrome without gangrene: Secondary | ICD-10-CM | POA: Diagnosis not present

## 2022-11-04 DIAGNOSIS — M170 Bilateral primary osteoarthritis of knee: Secondary | ICD-10-CM | POA: Diagnosis not present

## 2022-11-04 DIAGNOSIS — M25512 Pain in left shoulder: Secondary | ICD-10-CM | POA: Diagnosis not present

## 2022-11-04 DIAGNOSIS — M7062 Trochanteric bursitis, left hip: Secondary | ICD-10-CM

## 2022-11-04 DIAGNOSIS — M5136 Other intervertebral disc degeneration, lumbar region: Secondary | ICD-10-CM

## 2022-11-04 DIAGNOSIS — M436 Torticollis: Secondary | ICD-10-CM

## 2022-11-04 DIAGNOSIS — M3219 Other organ or system involvement in systemic lupus erythematosus: Secondary | ICD-10-CM | POA: Diagnosis not present

## 2022-11-04 DIAGNOSIS — Z79899 Other long term (current) drug therapy: Secondary | ICD-10-CM | POA: Diagnosis not present

## 2022-11-04 DIAGNOSIS — M19041 Primary osteoarthritis, right hand: Secondary | ICD-10-CM | POA: Diagnosis not present

## 2022-11-04 DIAGNOSIS — E559 Vitamin D deficiency, unspecified: Secondary | ICD-10-CM

## 2022-11-04 DIAGNOSIS — M19042 Primary osteoarthritis, left hand: Secondary | ICD-10-CM

## 2022-11-04 DIAGNOSIS — M19072 Primary osteoarthritis, left ankle and foot: Secondary | ICD-10-CM

## 2022-11-04 DIAGNOSIS — G8929 Other chronic pain: Secondary | ICD-10-CM | POA: Diagnosis not present

## 2022-11-04 DIAGNOSIS — M19071 Primary osteoarthritis, right ankle and foot: Secondary | ICD-10-CM

## 2022-11-04 DIAGNOSIS — I351 Nonrheumatic aortic (valve) insufficiency: Secondary | ICD-10-CM

## 2022-11-04 DIAGNOSIS — E7800 Pure hypercholesterolemia, unspecified: Secondary | ICD-10-CM

## 2022-11-04 DIAGNOSIS — Z8669 Personal history of other diseases of the nervous system and sense organs: Secondary | ICD-10-CM

## 2022-11-04 DIAGNOSIS — M5030 Other cervical disc degeneration, unspecified cervical region: Secondary | ICD-10-CM | POA: Diagnosis not present

## 2022-11-04 DIAGNOSIS — Z8679 Personal history of other diseases of the circulatory system: Secondary | ICD-10-CM

## 2022-11-04 MED ORDER — TRIAMCINOLONE ACETONIDE 40 MG/ML IJ SUSP
40.0000 mg | INTRAMUSCULAR | Status: AC | PRN
  Administered 2022-11-04: 40 mg via INTRA_ARTICULAR

## 2022-11-04 MED ORDER — LIDOCAINE HCL 1 % IJ SOLN
1.0000 mL | INTRAMUSCULAR | Status: AC | PRN
  Administered 2022-11-04: 1 mL

## 2022-11-04 NOTE — Patient Instructions (Addendum)
Standing Labs We placed an order today for your standing lab work.   Please have your standing labs drawn in August  Please have your labs drawn 2 weeks prior to your appointment so that the provider can discuss your lab results at your appointment, if possible.  Please note that you may see your imaging and lab results in [facility] before we have reviewed them. We will contact you once all results are reviewed. Please allow our office up to 72 hours to thoroughly review all of the results before contacting the office for clarification of your results.  WALK-IN LAB HOURS  Monday through Thursday from 8:00 am -12:30 pm and 1:00 pm-5:00 pm and Friday from 8:00 am-12:00 pm.  Patients with office visits requiring labs will be seen before walk-in labs.  You may encounter longer than normal wait times. Please allow additional time. Wait times may be shorter on  Monday and Thursday afternoons.  We do not book appointments for walk-in labs. We appreciate your patience and understanding with our staff.   Labs are drawn by [facility]. Please bring your co-pay at the time of your lab draw.  You may receive a bill from [facility] for your lab work.  Please note if you are on Hydroxychloroquine and and an order has been placed for a Hydroxychloroquine level,  you will need to have it drawn 4 hours or more after your last dose.  If you wish to have your labs drawn at another location, please call the office 24 hours in advance so we can fax the orders.  The office is located at [address] [place], [place], [place] [address]   If you have any questions regarding directions or hours of operation,  please call [phone].   As a reminder, please drink plenty of water prior to coming for your lab work. Thanks!   Vaccines You are taking a medication(s) that can suppress your immune system.  The following immunizations are recommended: Flu annually Covid-19  RSV Td/Tdap (tetanus, diphtheria, pertussis)  every 10 years Pneumonia (Prevnar 15 then Pneumovax 23 at least 1 year apart.  Alternatively, can take Prevnar 20 without needing additional dose) Shingrix: 2 doses from 4 weeks to 6 months apart Please check with your PCP to make sure you are up to date.   Please get an eye examination in June 2024 to monitor for eye toxicity while you are on hydroxychloroquine.  Shoulder exercises Ask your health care provider which exercises are safe for you. Do exercises exactly as told by your health care provider and adjust them as directed. It is normal to feel mild stretching, pulling, tightness, or discomfort as you do these exercises. Stop right away if you feel sudden pain or your pain gets worse. Do not begin these exercises until told by your health care provider. Stretching exercises External rotation and abduction This exercise is sometimes called corner stretch. The exercise rotates your arm outward (external rotation) and moves your arm out from your body (abduction). Stand in a doorway with one of your feet slightly in front of the other. This is called a staggered stance. If you cannot reach your forearms to the door frame, stand facing a corner of a room. Choose one of the following positions as told by your health care provider: Place your hands and forearms on the door frame above your head. Place your hands and forearms on the door frame at the height of your head. Place your hands on the door frame  at the height of your elbows. Slowly move your weight onto your front foot until you feel a stretch across your chest and in the front of your shoulders. Keep your head and chest upright and keep your abdominal muscles tight. Hold for __________ seconds. To release the stretch, shift your weight to your back foot. Repeat __________ times. Complete this exercise __________ times a day. Extension, standing  Stand and hold a broomstick, a cane, or a similar object behind your back. Your hands  should be a little wider than shoulder-width apart. Your palms should face away from your back. Keeping your elbows straight and your shoulder muscles relaxed, move the stick away from your body until you feel a stretch in your shoulders (extension). Avoid shrugging your shoulders while you move the stick. Keep your shoulder blades tucked down toward the middle of your back. Hold for __________ seconds. Slowly return to the starting position. Repeat __________ times. Complete this exercise __________ times a day. Range-of-motion exercises Pendulum  Stand near a wall or a surface that you can hold onto for balance. Bend at the waist and let your left / right arm hang straight down. Use your other arm to support you. Keep your back straight and do not lock your knees. Relax your left / right arm and shoulder muscles, and move your hips and your trunk so your left / right arm swings freely. Your arm should swing because of the motion of your body, not because you are using your arm or shoulder muscles. Keep moving your hips and trunk so your arm swings in the following directions, as told by your health care provider: Side to side. Forward and backward. In clockwise and counterclockwise circles. Continue each motion for __________ seconds, or for as long as told by your health care provider. Slowly return to the starting position. Repeat __________ times. Complete this exercise __________ times a day. Shoulder flexion, standing  Stand and hold a broomstick, a cane, or a similar object. Place your hands a little more than shoulder-width apart on the object. Your left / right hand should be palm-up, and your other hand should be palm-down. Keep your elbow straight and your shoulder muscles relaxed. Push the stick up with your healthy arm to raise your left / right arm in front of your body, and then over your head until you feel a stretch in your shoulder (flexion). Avoid shrugging your shoulder  while you raise your arm. Keep your shoulder blade tucked down toward the middle of your back. Hold for __________ seconds. Slowly return to the starting position. Repeat __________ times. Complete this exercise __________ times a day. Shoulder abduction, standing  Stand and hold a broomstick, a cane, or a similar object. Place your hands a little more than shoulder-width apart on the object. Your left / right hand should be palm-up, and your other hand should be palm-down. Keep your elbow straight and your shoulder muscles relaxed. Push the object across your body toward your left / right side. Raise your left / right arm to the side of your body (abduction) until you feel a stretch in your shoulder. Do not raise your arm above shoulder height unless your health care provider tells you to do that. If directed, raise your arm over your head. Avoid shrugging your shoulder while you raise your arm. Keep your shoulder blade tucked down toward the middle of your back. Hold for __________ seconds. Slowly return to the starting position. Repeat __________ times. Complete this  exercise __________ times a day. Internal rotation  Place your left / right hand behind your back, palm-up. Use your other hand to dangle an exercise band, a broomstick, or a similar object over your shoulder. Grasp the band with your left / right hand so you are holding on to both ends. Gently pull up on the band until you feel a stretch in the front of your left / right shoulder. The movement of your arm toward the center of your body is called internal rotation. Avoid shrugging your shoulder while you raise your arm. Keep your shoulder blade tucked down toward the middle of your back. Hold for __________ seconds. Release the stretch by letting go of the band and lowering your hands. Repeat __________ times. Complete this exercise __________ times a day. Strengthening exercises External rotation  Sit in a stable chair  without armrests. Secure an exercise band to a stable object at elbow height on your left / right side. Place a soft object, such as a folded towel or a small pillow, between your left / right upper arm and your body to move your elbow about 4 inches (10 cm) away from your side. Hold the end of the exercise band so it is tight and there is no slack. Keeping your elbow pressed against the soft object, slowly move your forearm out, away from your abdomen (external rotation). Keep your body steady so only your forearm moves. Hold for __________ seconds. Slowly return to the starting position. Repeat __________ times. Complete this exercise __________ times a day. Shoulder abduction  Sit in a stable chair without armrests, or stand up. Hold a __________ lb / kg weight in your left / right hand, or hold an exercise band with both hands. Start with your arms straight down and your left / right palm facing in, toward your body. Slowly lift your left / right hand out to your side (abduction). Do not lift your hand above shoulder height unless your health care provider tells you that this is safe. Keep your arms straight. Avoid shrugging your shoulder while you do this movement. Keep your shoulder blade tucked down toward the middle of your back. Hold for __________ seconds. Slowly lower your arm, and return to the starting position. Repeat __________ times. Complete this exercise __________ times a day. Shoulder extension  Sit in a stable chair without armrests, or stand up. Secure an exercise band to a stable object in front of you so it is at shoulder height. Hold one end of the exercise band in each hand. Straighten your elbows and lift your hands up to shoulder height. Squeeze your shoulder blades together as you pull your hands down to the sides of your thighs (extension). Stop when your hands are straight down by your sides. Do not let your hands go behind your body. Hold for __________  seconds. Slowly return to the starting position. Repeat __________ times. Complete this exercise __________ times a day. Shoulder row  Sit in a stable chair without armrests, or stand up. Secure an exercise band to a stable object in front of you so it is at chest height. Hold one end of the exercise band in each hand. Position your palms so that your thumbs are facing the ceiling (neutral position). Bend each of your elbows to a 90-degree angle (right angle) and keep your upper arms at your sides. Step back or move the chair back until the band is tight and there is no slack. Slowly pull your  elbows back behind you. Hold for __________ seconds. Slowly return to the starting position. Repeat __________ times. Complete this exercise __________ times a day. Shoulder press-ups  Sit in a stable chair that has armrests. Sit upright, with your feet flat on the floor. Put your hands on the armrests so your elbows are bent and your fingers are pointing forward. Your hands should be about even with the sides of your body. Push down on the armrests and use your arms to lift yourself off the chair. Straighten your elbows and lift yourself up as much as you comfortably can. Move your shoulder blades down, and avoid letting your shoulders move up toward your ears. Keep your feet on the ground. As you get stronger, your feet should support less of your body weight as you lift yourself up. Hold for __________ seconds. Slowly lower yourself back into the chair. Repeat __________ times. Complete this exercise __________ times a day. Wall push-ups  Stand so you are facing a stable wall. Your feet should be about one arm-length away from the wall. Lean forward and place your palms on the wall at shoulder height. Keep your feet flat on the floor as you bend your elbows and lean forward toward the wall. Hold for __________ seconds. Straighten your elbows to push yourself back to the starting  position. Repeat __________ times. Complete this exercise __________ times a day. This information is not intended to replace advice given to you by your health care provider. Make sure you discuss any questions you have with your health care provider. Document Revised: 10/06/2021 Document Reviewed: 10/06/2021 Elsevier Patient Education  [place]. Cervical Strain and Sprain Rehab Ask your health care provider which exercises are safe for you. Do exercises exactly as told by your health care provider and adjust them as directed. It is normal to feel mild stretching, pulling, tightness, or discomfort as you do these exercises. Stop right away if you feel sudden pain or your pain gets worse. Do not begin these exercises until told by your health care provider. Stretching and range-of-motion exercises Cervical side bending  Using good posture, sit on a stable chair or stand up. Without moving your shoulders, slowly tilt your left / right ear to your shoulder until you feel a stretch in the neck muscles on the opposite side. You should be looking straight ahead. Hold for __________ seconds. Repeat with the other side of your neck. Repeat __________ times. Complete this exercise __________ times a day. Cervical rotation  Using good posture, sit on a stable chair or stand up. Slowly turn your head to the side as if you are looking over your left / right shoulder. Keep your eyes level with the ground. Stop when you feel a stretch along the side and the back of your neck. Hold for __________ seconds. Repeat this by turning to your other side. Repeat __________ times. Complete this exercise __________ times a day. Thoracic extension and pectoral stretch  Roll a towel or a small blanket so it is about 4 inches (10 cm) in diameter. Lie down on your back on a firm surface. Put the towel in the middle of your back across your spine. It should not be under your shoulder blades. Put your hands  behind your head and let your elbows fall out to your sides. Hold for __________ seconds. Repeat __________ times. Complete this exercise __________ times a day. Strengthening exercises Upper cervical flexion  Lie on your back with a thin pillow behind  your head or a small, rolled-up towel under your neck. Gently tuck your chin toward your chest and nod your head down to look toward your feet. Do not lift your head off the pillow. Hold for __________ seconds. Release the tension slowly. Relax your neck muscles completely before you repeat this exercise. Repeat __________ times. Complete this exercise __________ times a day. Cervical extension  Stand about 6 inches (15 cm) away from a wall, with your back facing the wall. Place a soft object, about 6-8 inches (15-20 cm) in diameter, between the back of your head and the wall. A soft object could be a small pillow, a ball, or a folded towel. Gently tilt your head back and press into the soft object. Keep your jaw and forehead relaxed. Hold for __________ seconds. Release the tension slowly. Relax your neck muscles completely before you repeat this exercise. Repeat __________ times. Complete this exercise __________ times a day. Posture and body mechanics Body mechanics refer to the movements and positions of your body while you do your daily activities. Posture is part of body mechanics. Good posture and healthy body mechanics can help to relieve stress in your body's tissues and joints. Good posture means that your spine is in its natural S-curve position (your spine is neutral), your shoulders are pulled back slightly, and your head is not tipped forward. The following are general guidelines for using improved posture and body mechanics in your everyday activities. Sitting  When sitting, keep your spine neutral and keep your feet flat on the floor. Use a footrest, if needed, and keep your thighs parallel to the floor. Avoid rounding your  shoulders. Avoid tilting your head forward. When working at a desk or a computer, keep your desk at a height where your hands are slightly lower than your elbows. Slide your chair under your desk so you are close enough to maintain good posture. When working at a computer, place your monitor at a height where you are looking straight ahead and you do not have to tilt your head forward or downward to look at the screen. Standing  When standing, keep your spine neutral and keep your feet about hip-width apart. Keep a slight bend in your knees. Your ears, shoulders, and hips should line up. When you do a task in which you stand in one place for a long time, place one foot up on a stable object that is 2-4 inches (5-10 cm) high, such as a footstool. This helps keep your spine neutral. Resting When lying down and resting, avoid positions that are most painful for you. Try to support your neck in a neutral position. You can use a contour pillow or a small rolled-up towel. Your pillow should support your neck but not push on it. This information is not intended to replace advice given to you by your health care provider. Make sure you discuss any questions you have with your health care provider. Document Revised: 03/08/2022 Document Reviewed: 03/08/2022 Elsevier Patient Education  [place].

## 2022-11-06 LAB — COMPLETE METABOLIC PANEL WITH GFR
AG Ratio: 1.1 (calc) (ref 1.0–2.5)
ALT: 11 U/L (ref 6–29)
AST: 16 U/L (ref 10–35)
Albumin: 4.1 g/dL (ref 3.6–5.1)
Alkaline phosphatase (APISO): 92 U/L (ref 37–153)
BUN: 19 mg/dL (ref 7–25)
CO2: 26 mmol/L (ref 20–32)
Calcium: 9.7 mg/dL (ref 8.6–10.4)
Chloride: 106 mmol/L (ref 98–110)
Creat: 0.87 mg/dL (ref 0.60–0.95)
Globulin: 3.6 g/dL (calc) (ref 1.9–3.7)
Glucose, Bld: 105 mg/dL — ABNORMAL HIGH (ref 65–99)
Potassium: 4.3 mmol/L (ref 3.5–5.3)
Sodium: 141 mmol/L (ref 135–146)
Total Bilirubin: 0.3 mg/dL (ref 0.2–1.2)
Total Protein: 7.7 g/dL (ref 6.1–8.1)
eGFR: 67 mL/min/{1.73_m2} (ref >=60)

## 2022-11-06 LAB — CBC WITH DIFFERENTIAL/PLATELET
Absolute Monocytes: 691 cells/uL (ref 200–950)
Basophils Absolute: 29 cells/uL (ref 0–200)
Basophils Relative: 0.4 %
Eosinophils Absolute: 202 cells/uL (ref 15–500)
Eosinophils Relative: 2.8 %
HCT: 35.6 % (ref 35.0–45.0)
Hemoglobin: 11.2 g/dL — ABNORMAL LOW (ref 11.7–15.5)
Lymphs Abs: 1980 cells/uL (ref 850–3900)
MCH: 26 pg — ABNORMAL LOW (ref 27.0–33.0)
MCHC: 31.5 g/dL — ABNORMAL LOW (ref 32.0–36.0)
MCV: 82.6 fL (ref 80.0–100.0)
MPV: 10.4 fL (ref 7.5–12.5)
Monocytes Relative: 9.6 %
Neutro Abs: 4298 cells/uL (ref 1500–7800)
Neutrophils Relative %: 59.7 %
Platelets: 304 10*3/uL (ref 140–400)
RBC: 4.31 10*6/uL (ref 3.80–5.10)
RDW: 12.7 % (ref 11.0–15.0)
Total Lymphocyte: 27.5 %
WBC: 7.2 10*3/uL (ref 3.8–10.8)

## 2022-11-06 LAB — PROTEIN / CREATININE RATIO, URINE
Creatinine, Urine: 78 mg/dL (ref 20–275)
Protein/Creat Ratio: 128 mg/g creat (ref 24–184)
Protein/Creatinine Ratio: 0.128 mg/mg creat (ref 0.024–0.184)
Total Protein, Urine: 10 mg/dL (ref 5–24)

## 2022-11-06 LAB — SEDIMENTATION RATE: Sed Rate: 22 mm/h (ref 0–30)

## 2022-11-06 LAB — C3 AND C4
C3 Complement: 156 mg/dL
C4 Complement: 34 mg/dL

## 2022-11-06 LAB — ANTI-DNA ANTIBODY, DOUBLE-STRANDED: ds DNA Ab: 2 IU/mL

## 2022-11-07 NOTE — Progress Notes (Signed)
Hemoglobin is low and stable.,  CMP normal, sed rate normal, complements normal, double-stranded DNA negative, urine protein creatinine ratio normal.  Labs do not indicate an autoimmune disease flare.

## 2022-11-19 DIAGNOSIS — M79601 Pain in right arm: Secondary | ICD-10-CM | POA: Diagnosis not present

## 2022-11-19 DIAGNOSIS — M79641 Pain in right hand: Secondary | ICD-10-CM | POA: Diagnosis not present

## 2022-12-08 NOTE — Progress Notes (Deleted)
Office Visit Note  Patient: Jessica Booker             Date of Birth: 04-Jul-1941           MRN: [id number]             PCP: Dorothyann Peng, MD Referring: Dorothyann Peng, MD Visit Date: 12/09/2022 Occupation: @GUAROCC @  Subjective:  No chief complaint on file.   History of Present Illness: Jessica Booker is a 82 y.o. female ***     Activities of Daily Living:  Patient reports morning stiffness for *** {minute/hour:19697}.   Patient {ACTIONS;DENIES/REPORTS:21021675::"Denies"} nocturnal pain.  Difficulty dressing/grooming: {ACTIONS;DENIES/REPORTS:21021675::"Denies"} Difficulty climbing stairs: {ACTIONS;DENIES/REPORTS:21021675::"Denies"} Difficulty getting out of chair: {ACTIONS;DENIES/REPORTS:21021675::"Denies"} Difficulty using hands for taps, buttons, cutlery, and/or writing: {ACTIONS;DENIES/REPORTS:21021675::"Denies"}  No Rheumatology ROS completed.   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   Dyspnea 04/11/2019   Belching 04/11/2019   Constipation 04/11/2019   Essential hypertension 11/02/2018   Lupus 11/02/2018   Primary osteoarthritis of both knees 06/06/2018   Cough variant asthma 08/25/2017   Systemic lupus erythematosus 02/18/2017   High risk medication use 02/18/2017   Raynaud's disease without gangrene 02/18/2017   DDD (degenerative disc disease), lumbar 02/18/2017   Primary osteoarthritis of both hands 02/18/2017   Primary osteoarthritis of both feet 02/18/2017   Vitamin D deficiency 02/18/2017   History of hypertension 02/18/2017   History of neuropathy 02/18/2017    Past Medical History:  Diagnosis Date   Asthma    Hypertension    Lupus (HCC)     Family History  Problem Relation Age of Onset   Heart attack Mother    Stomach cancer Father    Hypertension Sister    Heart disease Sister    Past Surgical History:  Procedure Laterality Date   ABDOMINAL HYSTERECTOMY     CHOLECYSTECTOMY      SHOULDER ARTHROSCOPY W/ ROTATOR CUFF REPAIR     R shoulder   TONSILLECTOMY     Social History   Social History Narrative   Not on file   Immunization History  Administered Date(s) Administered   Fluad Quad(high Dose 65+) 05/27/2021   Influenza, High Dose Seasonal PF 06/21/2017, 05/29/2018, 05/16/2019   Influenza-Unspecified 05/30/2018, 05/16/2019, 05/13/2020, 06/14/2022   PFIZER Comirnaty(Gray Top)Covid-19 Tri-Sucrose Vaccine 12/25/2020   PFIZER(Purple Top)SARS-COV-2 Vaccination 10/04/2019, 10/30/2019, 05/23/2020   Pfizer Covid-19 Vaccine Bivalent Booster 32yrs & up 07/19/2021, 07/12/2022   Pneumococcal Conjugate-13 07/06/2019   Pneumococcal Polysaccharide-23 09/17/2013   Zoster Recombinat (Shingrix) 05/29/2018, 08/01/2018   Zoster, Live 10/17/2020     Objective: Vital Signs: There were no vitals taken for this visit.   Physical Exam   Musculoskeletal Exam: ***  CDAI Exam: CDAI Score: -- Patient Global: --; Provider Global: -- Swollen: --; Tender: -- Joint Exam 12/09/2022   No joint exam has been documented for this visit   There is currently no information documented on the homunculus. Go to the Rheumatology activity and complete the homunculus joint exam.  Investigation: No additional findings.  Imaging: No results found.  Recent Labs: Lab Results  Component Value Date   WBC 7.2 11/04/2022   HGB 11.2 (L) 11/04/2022   PLT 304 11/04/2022   NA 141 11/04/2022   K 4.3 11/04/2022   CL 106 11/04/2022   CO2 26 11/04/2022   GLUCOSE 105 (H) 11/04/2022   BUN 19 11/04/2022   CREATININE 0.87 11/04/2022   BILITOT 0.3 11/04/2022   ALKPHOS 74  12/23/2021   AST 16 11/04/2022   ALT 11 11/04/2022   PROT 7.7 11/04/2022   ALBUMIN 4.2 12/23/2021   CALCIUM 9.7 11/04/2022   GFRAA 73 06/27/2020    Speciality Comments: PLQ Eye Exam: 02/22/2022 WNL @ [facility] Follow up in 1 year.   Procedures:  No procedures performed Allergies: Patient has no known  allergies.   Assessment / Plan:     Visit Diagnoses: No diagnosis found.  Orders: No orders of the defined types were placed in this encounter.  No orders of the defined types were placed in this encounter.   Face-to-face time spent with patient was *** minutes. Greater than 50% of time was spent in counseling and coordination of care.  Follow-Up Instructions: No follow-ups on file.   Ellen Henri, CMA  Note - This record has been created using [employer].  Chart creation errors have been sought, but may not always  have been located. Such creation errors do not reflect on  the standard of medical care.

## 2022-12-09 ENCOUNTER — Ambulatory Visit: Payer: Medicare PPO | Admitting: Rheumatology

## 2022-12-09 DIAGNOSIS — M19071 Primary osteoarthritis, right ankle and foot: Secondary | ICD-10-CM

## 2022-12-09 DIAGNOSIS — E559 Vitamin D deficiency, unspecified: Secondary | ICD-10-CM

## 2022-12-09 DIAGNOSIS — M7062 Trochanteric bursitis, left hip: Secondary | ICD-10-CM

## 2022-12-09 DIAGNOSIS — M5136 Other intervertebral disc degeneration, lumbar region: Secondary | ICD-10-CM

## 2022-12-09 DIAGNOSIS — M19042 Primary osteoarthritis, left hand: Secondary | ICD-10-CM

## 2022-12-09 DIAGNOSIS — I351 Nonrheumatic aortic (valve) insufficiency: Secondary | ICD-10-CM

## 2022-12-09 DIAGNOSIS — Z8679 Personal history of other diseases of the circulatory system: Secondary | ICD-10-CM

## 2022-12-09 DIAGNOSIS — G8929 Other chronic pain: Secondary | ICD-10-CM

## 2022-12-09 DIAGNOSIS — Z79899 Other long term (current) drug therapy: Secondary | ICD-10-CM

## 2022-12-09 DIAGNOSIS — I7300 Raynaud's syndrome without gangrene: Secondary | ICD-10-CM

## 2022-12-09 DIAGNOSIS — Z8669 Personal history of other diseases of the nervous system and sense organs: Secondary | ICD-10-CM

## 2022-12-09 DIAGNOSIS — M3219 Other organ or system involvement in systemic lupus erythematosus: Secondary | ICD-10-CM

## 2022-12-09 DIAGNOSIS — M5030 Other cervical disc degeneration, unspecified cervical region: Secondary | ICD-10-CM

## 2022-12-09 DIAGNOSIS — M170 Bilateral primary osteoarthritis of knee: Secondary | ICD-10-CM

## 2022-12-09 DIAGNOSIS — E7800 Pure hypercholesterolemia, unspecified: Secondary | ICD-10-CM

## 2022-12-14 ENCOUNTER — Ambulatory Visit (INDEPENDENT_AMBULATORY_CARE_PROVIDER_SITE_OTHER): Payer: Medicare PPO | Admitting: Internal Medicine

## 2022-12-14 ENCOUNTER — Encounter: Payer: Self-pay | Admitting: Internal Medicine

## 2022-12-14 VITALS — BP 122/68 | HR 65 | Temp 98.1°F | Ht 60.0 in | Wt 184.6 lb

## 2022-12-14 DIAGNOSIS — M25562 Pain in left knee: Secondary | ICD-10-CM | POA: Diagnosis not present

## 2022-12-14 DIAGNOSIS — R202 Paresthesia of skin: Secondary | ICD-10-CM | POA: Diagnosis not present

## 2022-12-14 DIAGNOSIS — D649 Anemia, unspecified: Secondary | ICD-10-CM | POA: Diagnosis not present

## 2022-12-14 DIAGNOSIS — E7800 Pure hypercholesterolemia, unspecified: Secondary | ICD-10-CM

## 2022-12-14 DIAGNOSIS — Z79899 Other long term (current) drug therapy: Secondary | ICD-10-CM | POA: Diagnosis not present

## 2022-12-14 DIAGNOSIS — Z0000 Encounter for general adult medical examination without abnormal findings: Secondary | ICD-10-CM

## 2022-12-14 DIAGNOSIS — F5101 Primary insomnia: Secondary | ICD-10-CM

## 2022-12-14 DIAGNOSIS — Z6836 Body mass index (BMI) 36.0-36.9, adult: Secondary | ICD-10-CM

## 2022-12-14 DIAGNOSIS — I700 Atherosclerosis of aorta: Secondary | ICD-10-CM | POA: Diagnosis not present

## 2022-12-14 DIAGNOSIS — E66812 Obesity, class 2: Secondary | ICD-10-CM

## 2022-12-14 DIAGNOSIS — I119 Hypertensive heart disease without heart failure: Secondary | ICD-10-CM | POA: Diagnosis not present

## 2022-12-14 DIAGNOSIS — E559 Vitamin D deficiency, unspecified: Secondary | ICD-10-CM

## 2022-12-14 LAB — POCT URINALYSIS DIPSTICK
Bilirubin, UA: NEGATIVE
Glucose, UA: NEGATIVE
Ketones, UA: NEGATIVE
Leukocytes, UA: NEGATIVE
Nitrite, UA: NEGATIVE
Protein, UA: NEGATIVE
Spec Grav, UA: 1.01 (ref 1.010–1.025)
Urobilinogen, UA: 0.2 E.U./dL
pH, UA: 5.5 (ref 5.0–8.0)

## 2022-12-14 MED ORDER — ATORVASTATIN CALCIUM 10 MG PO TABS: ORAL_TABLET | ORAL | 2 refills | Status: DC

## 2022-12-14 NOTE — Patient Instructions (Signed)

## 2022-12-14 NOTE — Progress Notes (Signed)
I,Jessica Booker,acting as a scribe for Gwynneth Aliment, MD.,have documented all relevant documentation on the behalf of Gwynneth Aliment, MD,as directed by  Gwynneth Aliment, MD while in the presence of Gwynneth Aliment, MD.   Subjective:     Patient ID: Jessica Booker , female    DOB: 07/29/1941 , 82 y.o.   MRN: [id number]   Chief Complaint  Patient presents with  . Annual Exam  . Hypertension  . Hyperlipidemia    HPI  She is here today for a full physical examination. She is no longer followed by GYN. She reports compliance with meds. She denies headaches, chest pain and shortness of breath.    Hypertension This is a chronic problem. The problem has been gradually improving since onset. The problem is controlled. Pertinent negatives include no chest pain, headaches or palpitations. The current treatment provides moderate improvement. Compliance problems include exercise.   Hyperlipidemia This is a chronic problem. The current episode started more than 1 year ago. The problem is controlled. Exacerbating diseases include obesity. She has no history of diabetes or hypothyroidism. Pertinent negatives include no chest pain. Current antihyperlipidemic treatment includes statins.     Past Medical History:  Diagnosis Date  . Asthma   . Hypertension   . Lupus      Family History  Problem Relation Age of Onset  . Heart attack Mother   . Stomach cancer Father   . Hypertension Sister   . Heart disease Sister      Current Outpatient Medications:  .  albuterol (VENTOLIN HFA) 108 (90 Base) MCG/ACT inhaler, TAKE 2 PUFFS BY MOUTH EVERY 6 HOURS AS NEEDED FOR WHEEZE OR SHORTNESS OF BREATH, Disp: 18 each, Rfl: 1 .  aspirin 81 MG tablet, Take 81 mg by mouth daily., Disp: , Rfl:  .  cetirizine (ZYRTEC) 5 MG tablet, TAKE 1 TABLET (5 MG TOTAL) BY MOUTH DAILY., Disp: 90 tablet, Rfl: 1 .  Cholecalciferol (D3 2000 PO), Take by mouth., Disp: , Rfl:  .  diclofenac sodium (VOLTAREN) 1 % GEL,  Apply 2 g to 4 g to affected joint up to 4 times daily PRN., Disp: 4 Tube, Rfl: 2 .  hydroxychloroquine (PLAQUENIL) 200 MG tablet, TAKE 1 TABLET BY MOUTH TWICE DAILY MONDAY THROUGH FRIDAY ONLY., Disp: 120 tablet, Rfl: 0 .  lidocaine (LIDODERM) 5 %, Place 1 patch onto the skin daily. Remove & Discard patch within 12 hours or as directed by MD, Disp: 30 patch, Rfl: 0 .  linaclotide (LINZESS) 72 MCG capsule, Take 1 capsule (72 mcg total) by mouth as needed., Disp: 30 capsule, Rfl: 1 .  NIFEdipine (ADALAT CC) 90 MG 24 hr tablet, Take 1 tablet (90 mg total) by mouth daily., Disp: 100 tablet, Rfl: 0 .  valsartan-hydrochlorothiazide (DIOVAN-HCT) 320-12.5 MG tablet, TAKE 1 TABLET BY MOUTH EVERY DAY IN THE MORNING, Disp: 90 tablet, Rfl: 1 .  atorvastatin (LIPITOR) 10 MG tablet, One tab po qd, except Sunday, Disp: 72 tablet, Rfl: 2   No Known Allergies    The patient states she uses post menopausal status for birth control. Last LMP was No LMP recorded. Patient is postmenopausal.. Negative for Dysmenorrhea. Negative for: breast discharge, breast lump(s), breast pain and breast self exam. Associated symptoms include abnormal vaginal bleeding. Pertinent negatives include abnormal bleeding (hematology), anxiety, decreased libido, depression, difficulty falling sleep, dyspareunia, history of infertility, nocturia, sexual dysfunction, sleep disturbances, urinary incontinence, urinary urgency, vaginal discharge and vaginal itching. Diet regular.The patient states  her exercise level is    . The patient's tobacco use is:  Social History   Tobacco Use  Smoking Status Never  . Passive exposure: Never  Smokeless Tobacco Never  . She has been exposed to passive smoke. The patient's alcohol use is:  Social History   Substance and Sexual Activity  Alcohol Use No    Review of Systems  Constitutional: Negative.   Respiratory: Negative.    Cardiovascular: Negative.  Negative for chest pain and palpitations.   Musculoskeletal:  Positive for arthralgias.       She reports a vibrating like sensation dealing with her right hand radiating up her arm. She did visit an urgent care  Neurological: Negative.  Negative for headaches.  Psychiatric/Behavioral: Negative.       Today's Vitals   12/14/22 1001  BP: 122/68  Pulse: 65  Temp: 98.1 F (36.7 C)  SpO2: 98%  Weight: 184 lb 9.6 oz (83.7 kg)  Height: 5' (1.524 m)   Body mass index is 36.05 kg/m.  Wt Readings from Last 3 Encounters:  12/14/22 184 lb 9.6 oz (83.7 kg)  11/04/22 185 lb 3.2 oz (84 kg)  09/08/22 184 lb 12.8 oz (83.8 kg)    Objective:  Physical Exam Vitals and nursing note reviewed.  Constitutional:      Appearance: Normal appearance.  HENT:     Head: Normocephalic and atraumatic.     Right Ear: Tympanic membrane, ear canal and external ear normal.     Left Ear: Tympanic membrane, ear canal and external ear normal.     Nose: Nose normal.     Mouth/Throat:     Mouth: Mucous membranes are moist.     Pharynx: Oropharynx is clear.  Eyes:     Extraocular Movements: Extraocular movements intact.     Conjunctiva/sclera: Conjunctivae normal.     Pupils: Pupils are equal, round, and reactive to light.  Cardiovascular:     Rate and Rhythm: Normal rate and regular rhythm.     Pulses: Normal pulses.     Heart sounds: Normal heart sounds.  Pulmonary:     Effort: Pulmonary effort is normal.     Breath sounds: Normal breath sounds.  Abdominal:     General: Abdomen is flat. Bowel sounds are normal.     Palpations: Abdomen is soft.  Genitourinary:    Comments: deferred Musculoskeletal:        General: Normal range of motion.     Cervical back: Normal range of motion and neck supple.  Skin:    General: Skin is warm and dry.  Neurological:     General: No focal deficit present.     Mental Status: She is alert and oriented to person, place, and time.  Psychiatric:        Mood and Affect: Mood normal.        Behavior: Behavior  normal.        Assessment And Plan:     1. Encounter for general adult medical examination w/o abnormal findings Comments: A full exam was performed.  Importance of monthly self breast exams was discussed with the patient.  2. Hypertensive heart disease without heart failure Comments: Chronic, well controlled. EKG performed, SB w/ HR 58.She will c/w valsartan 320/12.5mg  and nifedipine 90mg  daily. Reminded to follow low sodium diet. - POCT Urinalysis Dipstick (81002) - Microalbumin / creatinine urine ratio - EKG 12-Lead  3. Atherosclerosis of aorta Comments: Chronic, LDL goal < 70.  She will c/w atorvastatin   daily.  4. Anemia, unspecified type Comments: Previous labs reviewed, HB low. I wlil check labs as below.  She denies having any blood in her stool, gingival bleeding. - TSH - Iron, TIBC and Ferritin Panel - CBC no Diff  5. Paresthesia of right upper extremity  6. Pure hypercholesterolemia - TSH  7. Primary insomnia  8. Vitamin D deficiency  9. Acute pain of left knee  10. Class 2 severe obesity due to excess calories with serious comorbidity and body mass index (BMI) of 36.0 to 36.9 in adult Comments: She is encouraged to aim for at least 150 minutes of exercise/week, while striving for BMI<30 to decrease cardiac risk.  11. Drug therapy - Vitamin B12 She is encouraged to strive for BMI less than 30 to decrease cardiac risk. Advised to aim for at least 150 minutes of exercise per week.   Patient was given opportunity to ask questions. Patient verbalized understanding of the plan and was able to repeat key elements of the plan. All questions were answered to their satisfaction.   I, Gwynneth Aliment, MD, have reviewed all documentation for this visit. The documentation on 12/14/22 for the exam, diagnosis, procedures, and orders are all accurate and complete.   THE PATIENT IS ENCOURAGED TO PRACTICE SOCIAL DISTANCING DUE TO THE COVID-19 PANDEMIC.

## 2022-12-15 LAB — IRON,TIBC AND FERRITIN PANEL
Ferritin: 103 ng/mL (ref 15–150)
Iron Saturation: 25 % (ref 15–55)
Iron: 74 ug/dL (ref 27–139)
Total Iron Binding Capacity: 300 ug/dL (ref 250–450)
UIBC: 226 ug/dL (ref 118–369)

## 2022-12-15 LAB — CBC
Hematocrit: 35.1 % (ref 34.0–46.6)
Hemoglobin: 11.3 g/dL (ref 11.1–15.9)
MCH: 26.6 pg (ref 26.6–33.0)
MCHC: 32.2 g/dL (ref 31.5–35.7)
MCV: 83 fL (ref 79–97)
Platelets: 299 10*3/uL (ref 150–450)
RBC: 4.25 x10E6/uL (ref 3.77–5.28)
RDW: 13.1 % (ref 11.7–15.4)
WBC: 7.6 10*3/uL (ref 3.4–10.8)

## 2022-12-15 LAB — TSH: TSH: 1.06 u[IU]/mL (ref 0.450–4.500)

## 2022-12-15 LAB — VITAMIN B12: Vitamin B-12: 498 pg/mL (ref 232–1245)

## 2022-12-16 LAB — MICROALBUMIN / CREATININE URINE RATIO
Creatinine, Urine: 31.4 mg/dL
Microalb/Creat Ratio: 18 mg/g creat (ref 0–29)
Microalbumin, Urine: 5.7 ug/mL

## 2022-12-16 NOTE — Progress Notes (Signed)
Office Visit Note  Patient: Jessica Booker             Date of Birth: 11-30-1940           MRN: [id number]             PCP: Dorothyann Peng, MD Referring: Dorothyann Peng, MD Visit Date: 12/30/2022 Occupation: @GUAROCC @  Subjective:  Right hand numbness and tingling  History of Present Illness: Jessica Booker is a 82 y.o. female returns today after her last visit on November 04, 2022.  She states she has been experiencing pain and tingling in her right hand which has been radiating up her right forearm.  She denies any history of any repeated motion.  There is no history of injury.  She does minimal gardening.  She states her symptoms have been going on for the last couple of months which is eased off to some extent.  She denies any discomfort in her neck or lower back.  She states the left shoulder joint pain improved after the left subacromial bursa injection on November 04, 2022.  She has mild Raynauds symptoms which are manageable.  There is no history of oral ulcers, nasal ulcers, malar rash,  or Raynaud's phenomenon.    Activities of Daily Living:  Patient reports morning stiffness for 0 minutes.   Patient Reports nocturnal pain.  Difficulty dressing/grooming: Denies Difficulty climbing stairs: Denies Difficulty getting out of chair: Denies Difficulty using hands for taps, buttons, cutlery, and/or writing: Denies  Review of Systems  Constitutional:  Negative for fatigue.  HENT:  Negative for mouth sores and mouth dryness.   Eyes:  Negative for dryness.  Respiratory:  Negative for shortness of breath.   Cardiovascular:  Negative for chest pain and palpitations.  Gastrointestinal:  Negative for blood in stool, constipation and diarrhea.  Endocrine: Negative for increased urination.  Genitourinary:  Negative for involuntary urination.  Musculoskeletal:  Positive for joint pain, joint pain, joint swelling, myalgias, muscle tenderness and myalgias. Negative for gait problem, muscle  weakness and morning stiffness.  Skin:  Positive for rash and sensitivity to sunlight. Negative for color change and hair loss.  Allergic/Immunologic: Negative for susceptible to infections.  Neurological:  Negative for dizziness and headaches.  Hematological:  Negative for swollen glands.  Psychiatric/Behavioral:  Positive for sleep disturbance. Negative for depressed mood. The patient is not nervous/anxious.     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   Dyspnea 04/11/2019   Belching 04/11/2019   Constipation 04/11/2019   Essential hypertension 11/02/2018   Lupus (HCC) 11/02/2018   Primary osteoarthritis of both knees 06/06/2018   Cough variant asthma 08/25/2017   Systemic lupus erythematosus (HCC) 02/18/2017   High risk medication use 02/18/2017   Raynaud's disease without gangrene 02/18/2017   DDD (degenerative disc disease), lumbar 02/18/2017   Primary osteoarthritis of both hands 02/18/2017   Primary osteoarthritis of both feet 02/18/2017   Vitamin D deficiency 02/18/2017   History of hypertension 02/18/2017   History of neuropathy 02/18/2017    Past Medical History:  Diagnosis Date   Asthma    Hypertension    Lupus (HCC)     Family History  Problem Relation Age of Onset   Heart attack Mother    Stomach cancer Father    Hypertension Sister    Heart disease Sister    Past Surgical History:  Procedure Laterality Date   ABDOMINAL HYSTERECTOMY  CHOLECYSTECTOMY     SHOULDER ARTHROSCOPY W/ ROTATOR CUFF REPAIR     R shoulder   TONSILLECTOMY     Social History   Social History Narrative   Not on file   Immunization History  Administered Date(s) Administered   Fluad Quad(high Dose 65+) 05/27/2021   Influenza, High Dose Seasonal PF 06/21/2017, 05/29/2018, 05/16/2019   Influenza-Unspecified 05/30/2018, 05/16/2019, 05/13/2020, 06/14/2022   PFIZER Comirnaty(Gray Top)Covid-19 Tri-Sucrose Vaccine 12/25/2020    PFIZER(Purple Top)SARS-COV-2 Vaccination 10/04/2019, 10/30/2019, 05/23/2020   Pfizer Covid-19 Vaccine Bivalent Booster 29yrs & up 07/19/2021, 07/12/2022   Pneumococcal Conjugate-13 07/06/2019   Pneumococcal Polysaccharide-23 09/17/2013   Zoster Recombinat (Shingrix) 05/29/2018, 08/01/2018   Zoster, Live 10/17/2020     Objective: Vital Signs: BP (!) 147/72 (BP Location: Right Arm, Patient Position: Sitting, Cuff Size: Small)   Pulse 66   Resp 12   Ht 5' 4.5" (1.638 m)   Wt 182 lb (82.6 kg)   BMI 30.76 kg/m    Physical Exam Vitals and nursing note reviewed.  Constitutional:      Appearance: She is well-developed.  HENT:     Head: Normocephalic and atraumatic.  Eyes:     Conjunctiva/sclera: Conjunctivae normal.  Cardiovascular:     Rate and Rhythm: Normal rate and regular rhythm.     Heart sounds: Normal heart sounds.  Pulmonary:     Effort: Pulmonary effort is normal.     Breath sounds: Normal breath sounds.  Abdominal:     General: Bowel sounds are normal.     Palpations: Abdomen is soft.  Musculoskeletal:     Cervical back: Normal range of motion.  Lymphadenopathy:     Cervical: No cervical adenopathy.  Skin:    General: Skin is warm and dry.     Capillary Refill: Capillary refill takes less than 2 seconds.  Neurological:     Mental Status: She is alert and oriented to person, place, and time.  Psychiatric:        Behavior: Behavior normal.      Musculoskeletal Exam: She had some limitation with lateral rotation of the cervical spine more on the left side.  There was no tenderness over thoracic or lumbar spine.  Shoulder joints, elbow joints, wrist joints were in good range of motion.  She had bilateral CMC PIP and DIP thickening with no synovitis.  Tinel's and Phalen's test was positive.  She hip joints and knee joints in good range of motion.  There was no tenderness over ankles or MTPs.  CDAI Exam: CDAI Score: -- Patient Global: --; Provider Global:  -- Swollen: --; Tender: -- Joint Exam 12/30/2022   No joint exam has been documented for this visit   There is currently no information documented on the homunculus. Go to the Rheumatology activity and complete the homunculus joint exam.  Investigation: No additional findings.  Imaging: No results found.  Recent Labs: Lab Results  Component Value Date   WBC 7.6 12/14/2022   HGB 11.3 12/14/2022   PLT 299 12/14/2022   NA 141 11/04/2022   K 4.3 11/04/2022   CL 106 11/04/2022   CO2 26 11/04/2022   GLUCOSE 105 (H) 11/04/2022   BUN 19 11/04/2022   CREATININE 0.87 11/04/2022   BILITOT 0.3 11/04/2022   ALKPHOS 74 12/23/2021   AST 16 11/04/2022   ALT 11 11/04/2022   PROT 7.7 11/04/2022   ALBUMIN 4.2 12/23/2021   CALCIUM 9.7 11/04/2022   GFRAA 73 06/27/2020    Speciality Comments: [facility] Eye  Exam: 02/22/2022 WNL @ [facility] Follow up in 1 year.   Procedures:  No procedures performed Allergies: Patient has no known allergies.   Assessment / Plan:     Visit Diagnoses: Other systemic lupus erythematosus with other organ involvement (HCC) - -ANA, positive Smith, and positive Ro and RNP: Patient denies any history of oral ulcers, nasal ulcers, malar rash, photosensitivity, Raynaud's, lymphadenopathy or inflammatory arthritis.  Labs obtained on November 04, 2022 were reviewed.  Complements, sedimentation rate were normal.  Double-stranded ENA was negative.  Mild anemia was noted.  High risk medication use - Plaquenil 200 mg 1 tablet by mouth twice daily M-F. PLQ Eye Exam: 02/22/2022.  CBC and CMP from November 04, 2022 were reviewed.  Will get labs at the follow-up visit.  She did get eye examination this year.  Raynaud's disease without gangrene-she gives history of mild Raynaud's symptoms.  She had good capillary refill with no sclerodactyly.  Right hand paresthesia-she has been having worsening paresthesias in her right hand.  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  She denies being on the computer on the cell of the cell phone.  Her symptoms are consistent with carpal tunnel syndrome.  I offered nerve conduction velocity and EMG which she declined.  A prescription for right carpal tunnel brace was given.  I advised her to call us if her symptoms do not improve or get worse.  Chronic left shoulder pain -I injected her left shoulder at the last visit in March 2024.  She had good response to the cortisone injection and is currently asymptomatic.  She is followed by Dr. August Saucer.  X-rays of the left shoulder on 07/31/2021 were consistent with [facility] joint arthritis.MRI of the shoulder joint from May 20, 2022  Primary osteoarthritis of both hands-bilateral CMC PIP and DIP thickening was noted.  Joint protection was discussed.  Primary osteoarthritis of both knees-she had good range of motion without discomfort.  Primary osteoarthritis of both feet  DDD (degenerative disc disease), cervical - Multilevel severe spondylosis was noted.  She has limited range of motion without discomfort or radiculopathy.  DDD (degenerative disc disease), lumbar- for today.  History of neuropathy  History of hypertension-blood pressure was elevated at 147/72.  Repeat blood pressure was 131/82.  She was advised to monitor blood pressure closely and follow-up with the PCP.  Vitamin D deficiency-vitamin D was normal at 44.1 on September 08, 2022.  Pure hypercholesterolemia  Moderate aortic valve insufficiency - patient has a heart murmur and is followed by Dr. Jacinto Halim.  Orders: No orders of the defined types were placed in this encounter.  No orders of the defined types were placed in this encounter.    Follow-Up Instructions: Return in about 3 months (around 04/01/2023) for Systemic lupus.   Pollyann Savoy, MD  Note - This record has been created using Animal nutritionist.  Chart creation errors have been sought, but may not always  have been  located. Such creation errors do not reflect on  the standard of medical care.

## 2022-12-30 ENCOUNTER — Ambulatory Visit: Payer: Medicare PPO | Attending: Rheumatology | Admitting: Rheumatology

## 2022-12-30 ENCOUNTER — Encounter: Payer: Self-pay | Admitting: Rheumatology

## 2022-12-30 VITALS — BP 131/82 | HR 65 | Resp 12 | Ht 64.5 in | Wt 182.0 lb

## 2022-12-30 DIAGNOSIS — Z8669 Personal history of other diseases of the nervous system and sense organs: Secondary | ICD-10-CM

## 2022-12-30 DIAGNOSIS — M3219 Other organ or system involvement in systemic lupus erythematosus: Secondary | ICD-10-CM | POA: Diagnosis not present

## 2022-12-30 DIAGNOSIS — R202 Paresthesia of skin: Secondary | ICD-10-CM | POA: Diagnosis not present

## 2022-12-30 DIAGNOSIS — I351 Nonrheumatic aortic (valve) insufficiency: Secondary | ICD-10-CM

## 2022-12-30 DIAGNOSIS — M19042 Primary osteoarthritis, left hand: Secondary | ICD-10-CM

## 2022-12-30 DIAGNOSIS — M19071 Primary osteoarthritis, right ankle and foot: Secondary | ICD-10-CM | POA: Diagnosis not present

## 2022-12-30 DIAGNOSIS — I7300 Raynaud's syndrome without gangrene: Secondary | ICD-10-CM | POA: Diagnosis not present

## 2022-12-30 DIAGNOSIS — M5136 Other intervertebral disc degeneration, lumbar region: Secondary | ICD-10-CM

## 2022-12-30 DIAGNOSIS — M19041 Primary osteoarthritis, right hand: Secondary | ICD-10-CM | POA: Diagnosis not present

## 2022-12-30 DIAGNOSIS — Z79899 Other long term (current) drug therapy: Secondary | ICD-10-CM

## 2022-12-30 DIAGNOSIS — M19072 Primary osteoarthritis, left ankle and foot: Secondary | ICD-10-CM

## 2022-12-30 DIAGNOSIS — M170 Bilateral primary osteoarthritis of knee: Secondary | ICD-10-CM | POA: Diagnosis not present

## 2022-12-30 DIAGNOSIS — M7062 Trochanteric bursitis, left hip: Secondary | ICD-10-CM

## 2022-12-30 DIAGNOSIS — Z8679 Personal history of other diseases of the circulatory system: Secondary | ICD-10-CM

## 2022-12-30 DIAGNOSIS — M5030 Other cervical disc degeneration, unspecified cervical region: Secondary | ICD-10-CM | POA: Diagnosis not present

## 2022-12-30 DIAGNOSIS — M25512 Pain in left shoulder: Secondary | ICD-10-CM | POA: Diagnosis not present

## 2022-12-30 DIAGNOSIS — E7800 Pure hypercholesterolemia, unspecified: Secondary | ICD-10-CM

## 2022-12-30 DIAGNOSIS — E559 Vitamin D deficiency, unspecified: Secondary | ICD-10-CM

## 2022-12-30 DIAGNOSIS — G8929 Other chronic pain: Secondary | ICD-10-CM

## 2023-01-05 ENCOUNTER — Other Ambulatory Visit: Payer: Self-pay | Admitting: Cardiology

## 2023-01-05 DIAGNOSIS — I10 Essential (primary) hypertension: Secondary | ICD-10-CM

## 2023-01-05 DIAGNOSIS — I351 Nonrheumatic aortic (valve) insufficiency: Secondary | ICD-10-CM

## 2023-01-08 ENCOUNTER — Encounter (HOSPITAL_BASED_OUTPATIENT_CLINIC_OR_DEPARTMENT_OTHER): Payer: Self-pay

## 2023-01-08 ENCOUNTER — Other Ambulatory Visit: Payer: Self-pay

## 2023-01-08 ENCOUNTER — Emergency Department (HOSPITAL_BASED_OUTPATIENT_CLINIC_OR_DEPARTMENT_OTHER)
Admission: EM | Admit: 2023-01-08 | Discharge: 2023-01-08 | Disposition: A | Discharge status: Home / Self Care | Payer: Medicare PPO | Attending: Emergency Medicine | Admitting: Emergency Medicine

## 2023-01-08 ENCOUNTER — Emergency Department (HOSPITAL_BASED_OUTPATIENT_CLINIC_OR_DEPARTMENT_OTHER): Payer: Medicare PPO | Admitting: Radiology

## 2023-01-08 DIAGNOSIS — J168 Pneumonia due to other specified infectious organisms: Secondary | ICD-10-CM | POA: Diagnosis not present

## 2023-01-08 DIAGNOSIS — J45909 Unspecified asthma, uncomplicated: Secondary | ICD-10-CM | POA: Insufficient documentation

## 2023-01-08 DIAGNOSIS — J181 Lobar pneumonia, unspecified organism: Secondary | ICD-10-CM | POA: Insufficient documentation

## 2023-01-08 DIAGNOSIS — Z79899 Other long term (current) drug therapy: Secondary | ICD-10-CM | POA: Diagnosis not present

## 2023-01-08 DIAGNOSIS — Z1152 Encounter for screening for COVID-19: Secondary | ICD-10-CM | POA: Insufficient documentation

## 2023-01-08 DIAGNOSIS — R059 Cough, unspecified: Secondary | ICD-10-CM | POA: Diagnosis not present

## 2023-01-08 DIAGNOSIS — I10 Essential (primary) hypertension: Secondary | ICD-10-CM | POA: Diagnosis not present

## 2023-01-08 DIAGNOSIS — R918 Other nonspecific abnormal finding of lung field: Secondary | ICD-10-CM | POA: Diagnosis not present

## 2023-01-08 DIAGNOSIS — J189 Pneumonia, unspecified organism: Secondary | ICD-10-CM

## 2023-01-08 LAB — SARS CORONAVIRUS 2 BY RT PCR: SARS Coronavirus 2 by RT PCR: NEGATIVE

## 2023-01-08 LAB — RESP PANEL BY RT-PCR (RSV, FLU A&B, COVID)  RVPGX2
Influenza A by PCR: NEGATIVE
Influenza B by PCR: NEGATIVE
Resp Syncytial Virus by PCR: NEGATIVE
SARS Coronavirus 2 by RT PCR: NEGATIVE

## 2023-01-08 MED ORDER — CEFTRIAXONE SODIUM 1 G IJ SOLR
1.0000 g | Freq: Once | INTRAMUSCULAR | Status: AC
  Administered 2023-01-08: 1 g via INTRAMUSCULAR
  Filled 2023-01-08: qty 10

## 2023-01-08 MED ORDER — ACETAMINOPHEN 325 MG PO TABS
650.0000 mg | ORAL_TABLET | Freq: Once | ORAL | Status: AC
  Administered 2023-01-08: 650 mg via ORAL
  Filled 2023-01-08: qty 2

## 2023-01-08 MED ORDER — LIDOCAINE HCL (PF) 1 % IJ SOLN
INTRAMUSCULAR | Status: AC
  Administered 2023-01-08: 5 mL
  Filled 2023-01-08: qty 5

## 2023-01-08 MED ORDER — IPRATROPIUM-ALBUTEROL 0.5-2.5 (3) MG/3ML IN SOLN
3.0000 mL | Freq: Once | RESPIRATORY_TRACT | Status: AC
  Administered 2023-01-08: 3 mL via RESPIRATORY_TRACT
  Filled 2023-01-08: qty 3

## 2023-01-08 MED ORDER — DOXYCYCLINE HYCLATE 100 MG PO CAPS: 100.0000 mg | ORAL_CAPSULE | Freq: Two times a day (BID) | ORAL | 0 refills | Status: DC

## 2023-01-08 MED ORDER — AMOXICILLIN-POT CLAVULANATE 875-125 MG PO TABS: 1.0000 | ORAL_TABLET | Freq: Two times a day (BID) | ORAL | 0 refills | Status: AC

## 2023-01-08 NOTE — ED Provider Notes (Signed)
[place] EMERGENCY DEPARTMENT AT [facility] Provider Note   CSN: [id number] Arrival date & time: 01/08/23  0848     History  Chief Complaint  Patient presents with   URI    Jessica Booker is a 82 y.o. female.  82 year old female with past medical history of hypertension, lupus, asthma presents with complaint of cough, congestion, fever.  Notes that she attended a funeral earlier in the week, began having a mild cough 3 days ago.  Cough is productive, onset of fever last night, temp not checked prior to arrival.  Notes she took Tylenol sometime in the middle of the night last night, arrives with temp of 102.3.  Has been taking Mucinex which she feels is helping with her cough.  Also using an albuterol inhaler.  Denies nausea, vomiting, changes in bowel or bladder habits.  No other complaints or concerns.       Home Medications Prior to Admission medications   Medication Sig Start Date End Date Taking? Authorizing Provider  amoxicillin-clavulanate (AUGMENTIN) 875-125 MG tablet Take 1 tablet by mouth every 12 (twelve) hours for 5 days. 01/08/23 01/13/23 Yes Jeannie Fend, PA-C  doxycycline (VIBRAMYCIN) 100 MG capsule Take 1 capsule (100 mg total) by mouth 2 (two) times daily. 01/08/23  Yes Jeannie Fend, PA-C  albuterol (VENTOLIN HFA) 108 (90 Base) MCG/ACT inhaler TAKE 2 PUFFS BY MOUTH EVERY 6 HOURS AS NEEDED FOR WHEEZE OR SHORTNESS OF BREATH 07/22/21   Arnette Felts, FNP  aspirin 81 MG tablet Take 81 mg by mouth daily.    [provider]  atorvastatin (LIPITOR) 10 MG tablet One tab po qd, except Sunday 12/14/22   Dorothyann Peng, MD  cetirizine (ZYRTEC) 5 MG tablet TAKE 1 TABLET (5 MG TOTAL) BY MOUTH DAILY. 08/19/22   Dorothyann Peng, MD  Cholecalciferol (D3 2000 PO) Take by mouth.    [provider]  diclofenac sodium (VOLTAREN) 1 % GEL Apply 2 g to 4 g to affected joint up to 4 times daily PRN. 10/30/18   Gearldine Bienenstock, PA-C  hydroxychloroquine  (PLAQUENIL) 200 MG tablet TAKE 1 TABLET BY MOUTH TWICE DAILY MONDAY THROUGH FRIDAY ONLY. 07/12/22   Gearldine Bienenstock, PA-C  lidocaine (LIDODERM) 5 % Place 1 patch onto the skin daily. Remove & Discard patch within 12 hours or as directed by MD 12/24/21   Pollyann Savoy, MD  linaclotide [facility]) 72 MCG capsule Take 1 capsule (72 mcg total) by mouth as needed. 05/17/22   Dorothyann Peng, MD  NIFEdipine (ADALAT CC) 90 MG 24 hr tablet TAKE 1 TABLET BY MOUTH EVERY DAY 01/05/23   Yates Decamp, MD  valsartan-hydrochlorothiazide (DIOVAN-HCT) 320-12.5 MG tablet TAKE 1 TABLET BY MOUTH EVERY DAY IN THE MORNING 08/25/22   Yates Decamp, MD      Allergies    Patient has no known allergies.    Review of Systems   Review of Systems Negative except as per HPI Physical Exam Updated Vital Signs BP (!) 130/40   Pulse 77   Temp 100.2 F (37.9 C) (Oral)   Resp (!) 21   Ht 5\' 4"  (1.626 m)   Wt 81.6 kg   SpO2 95%   BMI 30.90 kg/m  Physical Exam Vitals and nursing note reviewed.  Constitutional:      General: She is not in acute distress.    Appearance: She is well-developed. She is not diaphoretic.  HENT:     Head: Normocephalic and atraumatic.  Nose: Congestion present.  Cardiovascular:     Rate and Rhythm: Normal rate and regular rhythm.     Heart sounds: Normal heart sounds.  Pulmonary:     Effort: Tachypnea present.     Breath sounds: Wheezing present.     Comments: Mildly tachypneic  Skin:    General: Skin is warm and dry.     Findings: No erythema or rash.  Neurological:     Mental Status: She is alert and oriented to person, place, and time.  Psychiatric:        Behavior: Behavior normal.     ED Results / Procedures / Treatments   Labs (all labs ordered are listed, but only abnormal results are displayed) Labs Reviewed  SARS CORONAVIRUS 2 BY RT PCR  RESP PANEL BY RT-PCR (RSV, FLU A&B, COVID)  [id number]    EKG None  Radiology DG Chest 2 View  Result Date: 01/08/2023 CLINICAL  DATA:  2 day history of cough. EXAM: CHEST - 2 VIEW COMPARISON:  04/11/2019 FINDINGS: Focal right parahilar opacity is probably infectious/inflammatory. Streaky opacity at the left base suggest atelectasis or scarring. No edema or pleural effusion. Cardiopericardial silhouette is at upper limits of normal for size. No acute bony abnormality. IMPRESSION: Focal right parahilar opacity is probably infectious/inflammatory with pneumonia a distinct consideration. As neoplasm could present similarly, follow-up imaging recommended to ensure complete resolution. Electronically Signed   By: Kennith Center M.D.   On: 01/08/2023 09:34    Procedures Procedures    Medications Ordered in ED Medications  acetaminophen (TYLENOL) tablet 650 mg (650 mg Oral Given 01/08/23 0920)  ipratropium-albuterol (DUONEB) 0.5-2.5 (3) MG/3ML nebulizer solution 3 mL (3 mLs Nebulization Given 01/08/23 0930)  cefTRIAXone (ROCEPHIN) injection 1 g (1 g Intramuscular Given 01/08/23 1242)  lidocaine (PF) (XYLOCAINE) 1 % injection (5 mLs  Given 01/08/23 1243)    ED Course/ Medical Decision Making/ A&P                             Medical Decision Making Amount and/or Complexity of Data Reviewed Radiology: ordered.  Risk OTC drugs. Prescription drug management.   This patient presents to the ED for concern of cough, fever, this involves an extensive number of treatment options, and is a complaint that carries with it a high risk of complications and morbidity.  The differential diagnosis includes but not limited to pneumonia, COVID, flu   Co morbidities that complicate the patient evaluation  Lupus, HTN, asthma (? Has albuterol inhaler she uses without formal diagnosis of asthma or COPD)   Additional history obtained:  Additional history obtained from niece at bedside who lives nearby  External records from outside source obtained and reviewed including prior CXR on file   Lab Tests:  I Ordered, and personally  interpreted labs.  The pertinent results include:  covid/flu/rsv negative    Imaging Studies ordered:  I ordered imaging studies including CXR  I independently visualized and interpreted imaging which showed right perihilar pneumonia I agree with the radiologist interpretation   Consultations Obtained:  I requested consultation with the ER attending, Dr. Theresia Lo,  and discussed lab and imaging findings as well as pertinent plan - they recommend: agrees with plan for dc if ambulatory o2 ok and patient prefers   Problem List / ED Course / Critical interventions / Medication management  83 year old female presents with fever, cough, congestion as above.  Found to have mild wheezing, mildly increased  respiratory effort.  She is provided with DuoNeb with improvement, viral panel negative, chest x-ray with right middle lobe/perihilar pneumonia.  Fever improved with Tylenol.  Ambulatory O2 at 95%.  Shared decision making, offered admission due to concern for age, history of lupus, pneumonia on chest x-ray.  Patient would prefer to trial home at first, will give IM Rocephin here and discharged with Augmentin and Doxy.  She agrees to call her PCP office on Monday.  Discussed need for repeat chest x-ray or CT chest and follow-up with review of her chest x-ray report. I ordered medication including duoneb, rocephin  for pneumonia  Reevaluation of the patient after these medicines showed that the patient improved I have reviewed the patients home medicines and have made adjustments as needed   Social Determinants of Health:  Lives alone, has family near by   Test / Admission - Considered:  Offered admission, patient declines, would like to go home but agrees to return for any worsening or concerning symptoms.          Final Clinical Impression(s) / ED Diagnoses Final diagnoses:  Pneumonia of right middle lobe due to infectious organism    Rx / DC Orders ED Discharge Orders           Ordered    amoxicillin-clavulanate (AUGMENTIN) 875-125 MG tablet  Every 12 hours        01/08/23 1250    doxycycline (VIBRAMYCIN) 100 MG capsule  2 times daily        01/08/23 1250              Jeannie Fend, PA-C 01/08/23 1256    Theresia Lo, [place] K, DO 01/08/23 1552

## 2023-01-08 NOTE — Discharge Instructions (Addendum)
Follow up with your doctor on Monday for recheck. Review your chest x-ray report with your doctor. You will need a repeat chest x-ray, possibly a chest CT.   Return to the ER at any time for any worsening or concerning symptoms.   Take antibiotics as prescribed and complete the full course.

## 2023-01-08 NOTE — ED Triage Notes (Signed)
She c/o cough/congestion and mild shortness of breath x 2-3 days. She is in no distress. Fine, audible expiratory wheezes noted.

## 2023-01-08 NOTE — Progress Notes (Signed)
RT ambulated the Pt on RA. Pt's SATS 95% and HR 101. RT will continue to monitor

## 2023-01-10 ENCOUNTER — Telehealth: Payer: Self-pay

## 2023-01-10 ENCOUNTER — Ambulatory Visit: Payer: Medicare PPO | Admitting: Nurse Practitioner

## 2023-01-10 ENCOUNTER — Encounter: Payer: Self-pay | Admitting: Nurse Practitioner

## 2023-01-10 VITALS — BP 120/68 | HR 90 | Temp 99.1°F | Ht 64.0 in | Wt 182.6 lb

## 2023-01-10 DIAGNOSIS — J189 Pneumonia, unspecified organism: Secondary | ICD-10-CM | POA: Insufficient documentation

## 2023-01-10 DIAGNOSIS — R051 Acute cough: Secondary | ICD-10-CM | POA: Insufficient documentation

## 2023-01-10 DIAGNOSIS — J4541 Moderate persistent asthma with (acute) exacerbation: Secondary | ICD-10-CM | POA: Insufficient documentation

## 2023-01-10 DIAGNOSIS — R062 Wheezing: Secondary | ICD-10-CM | POA: Diagnosis not present

## 2023-01-10 MED ORDER — ALBUTEROL SULFATE (2.5 MG/3ML) 0.083% IN NEBU: 2.5000 mg | INHALATION_SOLUTION | RESPIRATORY_TRACT | 2 refills | Status: DC | PRN

## 2023-01-10 MED ORDER — HYDROCODONE BIT-HOMATROP MBR 5-1.5 MG/5ML PO SOLN
5.0000 mL | Freq: Four times a day (QID) | ORAL | 0 refills | Status: DC | PRN
Start: 2023-01-10 — End: 2023-12-20

## 2023-01-10 MED ORDER — TRIAMCINOLONE ACETONIDE 40 MG/ML IJ SUSP
40.0000 mg | Freq: Once | INTRAMUSCULAR | Status: AC
Start: 2023-01-10 — End: 2023-01-10
  Administered 2023-01-10: 40 mg via INTRAMUSCULAR

## 2023-01-10 NOTE — Assessment & Plan Note (Signed)
Seen in ER on 5/11 - repeat CXR in 4 weeks at [facility]. Has productive cough, continue augmentin and doxycycline given in ER.

## 2023-01-10 NOTE — Assessment & Plan Note (Signed)
Given nebulizer machine to use 2 times a day until end of week then as needed.

## 2023-01-10 NOTE — Assessment & Plan Note (Signed)
Will provide Hydromet, aware will cause drowsiness.

## 2023-01-10 NOTE — Telephone Encounter (Signed)
error 

## 2023-01-10 NOTE — Assessment & Plan Note (Signed)
Given nebulizer machine to use 2 times a day until end of week then as needed. Wheezing noted to bilateral lower lobes

## 2023-01-10 NOTE — Patient Instructions (Signed)

## 2023-01-10 NOTE — Progress Notes (Signed)
[name],acting as a Neurosurgeon for Arnette Felts, FNP.,have documented all relevant documentation on the behalf of Arnette Felts, FNP,as directed by  Arnette Felts, FNP while in the presence of Arnette Felts, FNP.    Subjective:     Patient ID: Jessica Booker , female    DOB: [birth year] , 82 y.o.   MRN: [id number]   Chief Complaint  Patient presents with   Hospitalization Follow-up        Pneumonia    HPI  Patient presents today for a hospital follow up, patient went to the ER on 01/08/2023 and diagnosed with Pneumonia of right middle lobe due to infectious organism. Patient reports today she is feeling better but when she has to rush it gets worse. Patient reports feeling weak but not dizzy, patient reports she has pain in her throat when coughing. When she is coughing she has white secretions. She is using albuterol 2 times as needed. Since the 11th she has been using the albuterol inhaler two times a day. Continues to have a fever off and on. Averaging 100. She feels like she is wheezing.   BP Readings from Last 3 Encounters: 01/10/23 : 120/68 01/08/23 : (!) 130/40 12/30/22 : 131/82       Past Medical History:  Diagnosis Date   Asthma    Hypertension    Lupus (HCC)      Family History  Problem Relation Age of Onset   Heart attack Mother    Stomach cancer Father    Hypertension Sister    Heart disease Sister      Current Outpatient Medications:    albuterol (PROVENTIL) (2.5 MG/3ML) 0.083% nebulizer solution, Take 3 mLs (2.5 mg total) by nebulization every 4 (four) hours as needed for wheezing or shortness of breath., Disp: 75 mL, Rfl: 2   albuterol (VENTOLIN HFA) 108 (90 Base) MCG/ACT inhaler, TAKE 2 PUFFS BY MOUTH EVERY 6 HOURS AS NEEDED FOR WHEEZE OR SHORTNESS OF BREATH, Disp: 18 each, Rfl: 1   aspirin 81 MG tablet, Take 81 mg by mouth daily., Disp: , Rfl:    atorvastatin (LIPITOR) 10 MG tablet, One tab po qd, except Sunday, Disp: 72 tablet, Rfl: 2    cetirizine (ZYRTEC) 5 MG tablet, TAKE 1 TABLET (5 MG TOTAL) BY MOUTH DAILY., Disp: 90 tablet, Rfl: 1   Cholecalciferol (D3 2000 PO), Take by mouth., Disp: , Rfl:    diclofenac sodium (VOLTAREN) 1 % GEL, Apply 2 g to 4 g to affected joint up to 4 times daily PRN., Disp: 4 Tube, Rfl: 2   doxycycline (VIBRAMYCIN) 100 MG capsule, Take 1 capsule (100 mg total) by mouth 2 (two) times daily., Disp: 20 capsule, Rfl: 0   HYDROcodone bit-homatropine (HYDROMET) 5-1.5 MG/5ML syrup, Take 5 mLs by mouth every 6 (six) hours as needed for cough., Disp: 120 mL, Rfl: 0   hydroxychloroquine (PLAQUENIL) 200 MG tablet, TAKE 1 TABLET BY MOUTH TWICE DAILY MONDAY THROUGH FRIDAY ONLY., Disp: 120 tablet, Rfl: 0   lidocaine (LIDODERM) 5 %, Place 1 patch onto the skin daily. Remove & Discard patch within 12 hours or as directed by MD, Disp: 30 patch, Rfl: 0   linaclotide (LINZESS) 72 MCG capsule, Take 1 capsule (72 mcg total) by mouth as needed., Disp: 30 capsule, Rfl: 1   NIFEdipine (ADALAT CC) 90 MG 24 hr tablet, TAKE 1 TABLET BY MOUTH EVERY DAY, Disp: 90 tablet, Rfl: 1   valsartan-hydrochlorothiazide (DIOVAN-HCT) 320-12.5 MG tablet, TAKE 1 TABLET BY MOUTH  EVERY DAY IN THE MORNING, Disp: 90 tablet, Rfl: 1   No Known Allergies   Review of Systems  Constitutional: Negative.   Respiratory:  Positive for chest tightness, shortness of breath and wheezing. Negative for cough.   Cardiovascular: Negative.   Neurological: Negative.   Psychiatric/Behavioral: Negative.       Today's Vitals   01/10/23 1227  BP: 120/68  Pulse: 90  Temp: 99.1 F (37.3 C)  TempSrc: Oral  SpO2: 95%  Weight: 182 lb 9.6 oz (82.8 kg)  Height: 5\' 4"  (1.626 m)  PainSc: 0-No pain   Body mass index is 31.34 kg/m.  Wt Readings from Last 3 Encounters:  01/10/23 182 lb 9.6 oz (82.8 kg)  01/08/23 180 lb (81.6 kg)  12/30/22 182 lb (82.6 kg)    The ASCVD Risk score (Arnett DK, et al., 2019) failed to calculate for the following reasons:   The  2019 ASCVD risk score is only valid for ages 20 to 48 ++ Objective:  Physical Exam Vitals reviewed.  Constitutional:      General: She is not in acute distress.    Appearance: Normal appearance. She is obese.  Cardiovascular:     Rate and Rhythm: Normal rate and regular rhythm.     Pulses: Normal pulses.     Heart sounds: Normal heart sounds. No murmur heard. Pulmonary:     Effort: Pulmonary effort is normal. No respiratory distress.     Breath sounds: Examination of the right-lower field reveals wheezing. Examination of the left-lower field reveals wheezing. Wheezing present.  Skin:    General: Skin is warm.     Capillary Refill: Capillary refill takes less than 2 seconds.  Neurological:     General: No focal deficit present.     Mental Status: She is alert and oriented to person, place, and time.     Cranial Nerves: No cranial nerve deficit.     Motor: No weakness.         Assessment And Plan:     1. Pneumonia of right middle lobe due to infectious organism Comments: She is doing better since being treated at the hospital however continues to do have coughing.  She will have a repeat chest ray in 4 weeks. TCM Performed. A member of the clinical team spoke with the patient upon dischare. Discharge summary was reviewed in full detail during the visit. Meds reconciled and compared to discharge meds. Medication list is updated and reviewed with the patient.  Greater than 50% face to face time was spent in counseling an coordination of care.  All questions were answered to the satisfaction of the patient.  Follow-up - DG Chest 2 View; Future - HYDROcodone bit-homatropine (HYDROMET) 5-1.5 MG/5ML syrup; Take 5 mLs by mouth every 6 (six) hours as needed for cough.  Dispense: 120 mL; Refill: 0 - triamcinolone acetonide (KENALOG-40) injection 40 mg - albuterol (PROVENTIL) (2.5 MG/3ML) 0.083% nebulizer solution; Take 3 mLs (2.5 mg total) by nebulization every 4 (four) hours as needed for  wheezing or shortness of breath.  Dispense: 75 mL; Refill: 2  2. Wheezing Comments: Will refill her albuterol nebulizer solution.  Encouraged to use at least twice a day until feeling better.    Return if symptoms worsen or fail to improve.   Patient was given opportunity to ask questions. Patient verbalized understanding of the plan and was able to repeat key elements of the plan. All questions were answered to their satisfaction.  Arnette Felts, FNP  Jeanell Sparrow, FNP, have reviewed all documentation for this visit. The documentation on 01/10/23 for the exam, diagnosis, procedures, and orders are all accurate and complete.   IF YOU HAVE BEEN REFERRED TO A SPECIALIST, IT MAY TAKE 1-2 WEEKS TO SCHEDULE/PROCESS THE REFERRAL. IF YOU HAVE NOT HEARD FROM US/SPECIALIST IN TWO WEEKS, PLEASE GIVE [place] A CALL AT [phone].   THE PATIENT IS ENCOURAGED TO PRACTICE SOCIAL DISTANCING DUE TO THE COVID-19 PANDEMIC.

## 2023-01-31 ENCOUNTER — Other Ambulatory Visit: Payer: Self-pay | Admitting: Physician Assistant

## 2023-01-31 ENCOUNTER — Other Ambulatory Visit: Payer: Self-pay | Admitting: Cardiology

## 2023-01-31 DIAGNOSIS — I10 Essential (primary) hypertension: Secondary | ICD-10-CM

## 2023-01-31 DIAGNOSIS — M3219 Other organ or system involvement in systemic lupus erythematosus: Secondary | ICD-10-CM

## 2023-01-31 DIAGNOSIS — I340 Nonrheumatic mitral (valve) insufficiency: Secondary | ICD-10-CM

## 2023-01-31 NOTE — Telephone Encounter (Signed)
Last Fill: 07/12/2022  Eye exam: 02/22/2022   Labs: 11/04/2022 Hemoglobin is low and stable.,  CMP normal, sed rate normal, complements normal, double-stranded DNA negative, urine protein creatinine ratio normal.  Labs do not indicate an autoimmune disease flare.  12/14/2022 CBC no Diff WNL   Next Visit: 04/07/2023  Last Visit: 12/30/2022  DX:Other systemic lupus erythematosus with other organ involvement   Current Dose per office note on 12/30/2022: Plaquenil 200 mg 1 tablet by mouth twice daily M-F.   Okay to refill Plaquenil?

## 2023-03-25 NOTE — Progress Notes (Deleted)
Office Visit Note  Patient: Jessica Booker             Date of Birth: 1941/03/11           MRN: [id number]             PCP: Dorothyann Peng, MD Referring: Dorothyann Peng, MD Visit Date: 04/07/2023 Occupation: @GUAROCC @  Subjective:  No chief complaint on file.   History of Present Illness: Jessica Booker is a 82 y.o. female ***     Activities of Daily Living:  Patient reports morning stiffness for *** {minute/hour:19697}.   Patient {ACTIONS;DENIES/REPORTS:21021675::"Denies"} nocturnal pain.  Difficulty dressing/grooming: {ACTIONS;DENIES/REPORTS:21021675::"Denies"} Difficulty climbing stairs: {ACTIONS;DENIES/REPORTS:21021675::"Denies"} Difficulty getting out of chair: {ACTIONS;DENIES/REPORTS:21021675::"Denies"} Difficulty using hands for taps, buttons, cutlery, and/or writing: {ACTIONS;DENIES/REPORTS:21021675::"Denies"}  No Rheumatology ROS completed.   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   Cough variant asthma 08/25/2017   Systemic lupus erythematosus (HCC) 02/18/2017   High risk medication use 02/18/2017   Raynaud's disease without gangrene 02/18/2017   DDD (degenerative disc disease), lumbar 02/18/2017   Primary osteoarthritis of both hands 02/18/2017   Primary osteoarthritis of both feet 02/18/2017   Vitamin D deficiency 02/18/2017   History of hypertension 02/18/2017   History of neuropathy 02/18/2017    Past Medical History:  Diagnosis Date   Asthma    Hypertension    Lupus (HCC)     Family History  Problem Relation Age of Onset    Heart attack Mother    Stomach cancer Father    Hypertension Sister    Heart disease Sister    Past Surgical History:  Procedure Laterality Date   ABDOMINAL HYSTERECTOMY     CHOLECYSTECTOMY     SHOULDER ARTHROSCOPY W/ ROTATOR CUFF REPAIR     R shoulder   TONSILLECTOMY     Social History   Social History Narrative   Not on file   Immunization History  Administered Date(s) Administered   Fluad Quad(high Dose 65+) 05/27/2021   Influenza, High Dose Seasonal PF 06/21/2017, 05/29/2018, 05/16/2019   Influenza-Unspecified 05/30/2018, 05/16/2019, 05/13/2020, 06/14/2022   PFIZER Comirnaty(Gray Top)Covid-19 Tri-Sucrose Vaccine 12/25/2020   PFIZER(Purple Top)SARS-COV-2 Vaccination 10/04/2019, 10/30/2019, 05/23/2020   Pfizer Covid-19 Vaccine Bivalent Booster 60yrs & up 07/19/2021, 07/12/2022   Pneumococcal Conjugate-13 07/06/2019   Pneumococcal Polysaccharide-23 09/17/2013   Zoster Recombinant(Shingrix) 05/29/2018, 08/01/2018   Zoster, Live 10/17/2020     Objective: Vital Signs: There were no vitals taken for this visit.   Physical Exam   Musculoskeletal Exam: ***  CDAI Exam: CDAI Score: -- Patient Global: --; Provider Global: -- Swollen: --; Tender: -- Joint Exam 04/07/2023   No joint exam has been documented for this visit   There is currently no information documented on the homunculus. Go to the Rheumatology activity and complete the homunculus joint exam.  Investigation: No additional findings.  Imaging: No results found.  Recent Labs: Lab Results  Component Value Date   WBC 7.6 12/14/2022   HGB 11.3 12/14/2022   PLT 299 12/14/2022   NA 141 11/04/2022   K 4.3 11/04/2022   CL 106 11/04/2022  CO2 26 11/04/2022   GLUCOSE 105 (H) 11/04/2022   BUN 19 11/04/2022   CREATININE 0.87 11/04/2022   BILITOT 0.3 11/04/2022   ALKPHOS 74 12/23/2021   AST 16 11/04/2022   ALT 11 11/04/2022   PROT 7.7 11/04/2022   ALBUMIN 4.2 12/23/2021   CALCIUM 9.7 11/04/2022    GFRAA 73 06/27/2020    Speciality Comments: PLQ Eye Exam: 02/22/2022 WNL @ [facility] Follow up in 1 year.   Procedures:  No procedures performed Allergies: Patient has no known allergies.   Assessment / Plan:     Visit Diagnoses: No diagnosis found.  Orders: No orders of the defined types were placed in this encounter.  No orders of the defined types were placed in this encounter.   Face-to-face time spent with patient was *** minutes. Greater than 50% of time was spent in counseling and coordination of care.  Follow-Up Instructions: No follow-ups on file.   Ellen Henri, CMA  Note - This record has been created using Animal nutritionist.  Chart creation errors have been sought, but may not always  have been located. Such creation errors do not reflect on  the standard of medical care.

## 2023-03-30 ENCOUNTER — Encounter: Payer: Self-pay | Admitting: Internal Medicine

## 2023-03-30 ENCOUNTER — Ambulatory Visit (INDEPENDENT_AMBULATORY_CARE_PROVIDER_SITE_OTHER): Payer: Medicare PPO

## 2023-03-30 ENCOUNTER — Ambulatory Visit: Payer: Medicare PPO | Admitting: Internal Medicine

## 2023-03-30 VITALS — BP 128/60 | HR 96 | Temp 98.4°F | Ht 64.0 in | Wt 177.2 lb

## 2023-03-30 VITALS — BP 128/60 | HR 96 | Temp 98.4°F | Ht 64.0 in | Wt 177.0 lb

## 2023-03-30 DIAGNOSIS — G4762 Sleep related leg cramps: Secondary | ICD-10-CM

## 2023-03-30 DIAGNOSIS — E6609 Other obesity due to excess calories: Secondary | ICD-10-CM

## 2023-03-30 DIAGNOSIS — F5101 Primary insomnia: Secondary | ICD-10-CM | POA: Diagnosis not present

## 2023-03-30 DIAGNOSIS — I700 Atherosclerosis of aorta: Secondary | ICD-10-CM

## 2023-03-30 DIAGNOSIS — E7800 Pure hypercholesterolemia, unspecified: Secondary | ICD-10-CM

## 2023-03-30 DIAGNOSIS — Z0000 Encounter for general adult medical examination without abnormal findings: Secondary | ICD-10-CM

## 2023-03-30 DIAGNOSIS — Z6830 Body mass index (BMI) 30.0-30.9, adult: Secondary | ICD-10-CM | POA: Diagnosis not present

## 2023-03-30 DIAGNOSIS — I119 Hypertensive heart disease without heart failure: Secondary | ICD-10-CM

## 2023-03-30 NOTE — Progress Notes (Signed)
I,Victoria T Deloria Lair, CMA,acting as a Neurosurgeon for Gwynneth Aliment, MD.,have documented all relevant documentation on the behalf of Gwynneth Aliment, MD,as directed by  Gwynneth Aliment, MD while in the presence of Gwynneth Aliment, MD.  Subjective:  Patient ID: Jessica Booker , female    DOB: Aug 21, 1941 , 82 y.o.   MRN: [id number]  Chief Complaint  Patient presents with   Hypertension   Hyperlipidemia    HPI  Patient presents today for bp & chol follow up. She reports compliance with medications. Denies headache, chest pain, SOB.  She is now on atorvastatin for her cholesterol. She has been having leg cramps since starting the medication.  Her sx usually occur at night.    AWV completed with [facility] advisor: Nickeah   Hypertension This is a chronic problem. The current episode started more than 1 year ago. The problem has been gradually improving since onset. The problem is controlled. Associated symptoms include anxiety. Pertinent negatives include no headaches, palpitations or peripheral edema. Neck pain: c/o neck pain. Denies UEweakness/tingling. Muscles feel tight. .Risk factors for coronary artery disease include sedentary lifestyle. Past treatments include diuretics. There are no compliance problems.  There is no history of angina. There is no history of chronic renal disease.     Past Medical History:  Diagnosis Date   Asthma    Hypertension    Lupus (HCC)      Family History  Problem Relation Age of Onset   Heart attack Mother    Stomach cancer Father    Hypertension Sister    Heart disease Sister      Current Outpatient Medications:    albuterol (PROVENTIL) (2.5 MG/3ML) 0.083% nebulizer solution, Take 3 mLs (2.5 mg total) by nebulization every 4 (four) hours as needed for wheezing or shortness of breath., Disp: 75 mL, Rfl: 2   albuterol (VENTOLIN HFA) 108 (90 Base) MCG/ACT inhaler, TAKE 2 PUFFS BY MOUTH EVERY 6 HOURS AS NEEDED FOR WHEEZE OR SHORTNESS OF BREATH, Disp: 18  each, Rfl: 1   aspirin 81 MG tablet, Take 81 mg by mouth daily., Disp: , Rfl:    atorvastatin (LIPITOR) 10 MG tablet, One tab po qd, except Sunday, Disp: 72 tablet, Rfl: 2   cetirizine (ZYRTEC) 5 MG tablet, TAKE 1 TABLET (5 MG TOTAL) BY MOUTH DAILY., Disp: 90 tablet, Rfl: 1   Cholecalciferol (D3 2000 PO), Take by mouth., Disp: , Rfl:    diclofenac sodium (VOLTAREN) 1 % GEL, Apply 2 g to 4 g to affected joint up to 4 times daily PRN., Disp: 4 Tube, Rfl: 2   doxycycline (VIBRAMYCIN) 100 MG capsule, Take 1 capsule (100 mg total) by mouth 2 (two) times daily. (Patient not taking: Reported on 03/30/2023), Disp: 20 capsule, Rfl: 0   HYDROcodone bit-homatropine (HYDROMET) 5-1.5 MG/5ML syrup, Take 5 mLs by mouth every 6 (six) hours as needed for cough. (Patient not taking: Reported on 03/30/2023), Disp: 120 mL, Rfl: 0   hydroxychloroquine (PLAQUENIL) 200 MG tablet, TAKE 1 TABLET BY MOUTH TWICE DAILY MONDAY THROUGH FRIDAY ONLY., Disp: 120 tablet, Rfl: 0   lidocaine (LIDODERM) 5 %, Place 1 patch onto the skin daily. Remove & Discard patch within 12 hours or as directed by MD, Disp: 30 patch, Rfl: 0   linaclotide (LINZESS) 72 MCG capsule, Take 1 capsule (72 mcg total) by mouth as needed., Disp: 30 capsule, Rfl: 1   NIFEdipine (ADALAT CC) 90 MG 24 hr tablet, TAKE 1 TABLET BY MOUTH EVERY  DAY, Disp: 90 tablet, Rfl: 1   valsartan-hydrochlorothiazide (DIOVAN-HCT) 320-12.5 MG tablet, TAKE 1 TABLET BY MOUTH EVERY DAY IN THE MORNING, Disp: 90 tablet, Rfl: 1   No Known Allergies   Review of Systems  Constitutional: Negative.   Respiratory: Negative.    Cardiovascular: Negative.  Negative for palpitations.  Gastrointestinal: Negative.   Musculoskeletal:  Neck pain: c/o neck pain. Denies UEweakness/tingling. Muscles feel tight. .  Skin: Negative.   Neurological: Negative.  Negative for headaches.  Psychiatric/Behavioral: Negative.       Today's Vitals   03/30/23 1432 03/30/23 1505  BP: (!) 160/70 128/60   Pulse: 96   Temp: 98.4 F (36.9 C)   SpO2: 98%   Weight: 177 lb (80.3 kg)   Height: 5\' 4"  (1.626 m)    Body mass index is 30.38 kg/m.  Wt Readings from Last 3 Encounters:  03/30/23 177 lb (80.3 kg)  03/30/23 177 lb 3.2 oz (80.4 kg)  01/10/23 182 lb 9.6 oz (82.8 kg)    BP Readings from Last 3 Encounters:  03/30/23 128/60  03/30/23 128/60  01/10/23 120/68     Objective:  Physical Exam Vitals and nursing note reviewed.  Constitutional:      Appearance: Normal appearance.  HENT:     Head: Normocephalic and atraumatic.  Eyes:     Extraocular Movements: Extraocular movements intact.  Cardiovascular:     Rate and Rhythm: Normal rate and regular rhythm.     Heart sounds: Normal heart sounds.  Pulmonary:     Effort: Pulmonary effort is normal.     Breath sounds: Normal breath sounds.  Musculoskeletal:     Cervical back: Normal range of motion.  Skin:    General: Skin is warm.  Neurological:     General: No focal deficit present.     Mental Status: She is alert.  Psychiatric:        Mood and Affect: Mood normal.        Behavior: Behavior normal.         Assessment And Plan:  Hypertensive heart disease without heart failure Assessment & Plan: Chronic, fair control. Goal BP<120/80.  She will continue with nifedipine 90mg  daily and valsartan/hct 320/12.5mg  daily. She is reminded to follow a low sodium diet.   Orders: -     BMP8+EGFR  Atherosclerosis of aorta (HCC) Assessment & Plan: Chronic, LDL goal is less than 70.  She will continue with atorvastatin. Unfortunately, she doesn't tolerate daily dosing due to leg cramps.    Pure hypercholesterolemia Assessment & Plan: Chronic, LDL goal < 70.  Chronic, has debilitating leg cramps with atorvastatin daily, except Sundays. Pt advised to take M-F, skipping Sat/Sun.      Sleep related leg cramps Assessment & Plan: She is encouraged to stay well hydrated. She would likely benefit from magnesium supplementation  nightly. She should also incorporate coconut water into her diet.   Orders: -     Magnesium -     CK  Primary insomnia Assessment & Plan: She is encouraged to develop a bedtime routine. If persistent, will consider trazodone or Belsomra nightly. Reminded to avoid watching TV and using her cellphone within an hour of going to bed.    Class 1 obesity due to excess calories with serious comorbidity and body mass index (BMI) of 30.0 to 30.9 in adult Assessment & Plan: She is encouraged to aim for at least 150 minutes of exercise/week.    She is encouraged to strive for BMI less  than 30 to decrease cardiac risk. Advised to aim for at least 150 minutes of exercise per week.    Return for 1 year ov w rs & [facility]. 4 month bpc/chol check.  Patient was given opportunity to ask questions. Patient verbalized understanding of the plan and was able to repeat key elements of the plan. All questions were answered to their satisfaction.    I, Gwynneth Aliment, MD, have reviewed all documentation for this visit. The documentation on 03/30/23 for the exam, diagnosis, procedures, and orders are all accurate and complete.   IF YOU HAVE BEEN REFERRED TO A SPECIALIST, IT MAY TAKE 1-2 WEEKS TO SCHEDULE/PROCESS THE REFERRAL. IF YOU HAVE NOT HEARD FROM US/SPECIALIST IN TWO WEEKS, PLEASE GIVE [place] A CALL AT [phone].   THE PATIENT IS ENCOURAGED TO PRACTICE SOCIAL DISTANCING DUE TO THE COVID-19 PANDEMIC.

## 2023-03-30 NOTE — Patient Instructions (Signed)
Jessica Booker , Thank you for taking time to come for your Medicare Wellness Visit. I appreciate your ongoing commitment to your health goals. Please review the following plan we discussed and let me know if I can assist you in the future.   Referrals/Orders/Follow-Ups/Clinician Recommendations: none  This is a list of the screening recommended for you and due dates:  Health Maintenance  Topic Date Due   COVID-19 Vaccine (7 - 2023-24 season) 09/06/2022   Flu Shot  03/31/2023   Medicare Annual Wellness Visit  03/29/2024   Pneumonia Vaccine  Completed   DEXA scan (bone density measurement)  Completed   Zoster (Shingles) Vaccine  Completed   HPV Vaccine  Aged Out   DTaP/Tdap/Td vaccine  Discontinued   Hepatitis C Screening  Discontinued    Advanced directives: (Copy Requested) Please bring a copy of your health care power of attorney and living will to the office to be added to your chart at your convenience.  Next Medicare Annual Wellness Visit scheduled for next year: No, office will make appointment  Preventive Care 65 Years and Older, Female Preventive care refers to lifestyle choices and visits with your health care provider that can promote health and wellness. What does preventive care include? A yearly physical exam. This is also called an annual well check. Dental exams once or twice a year. Routine eye exams. Ask your health care provider how often you should have your eyes checked. Personal lifestyle choices, including: Daily care of your teeth and gums. Regular physical activity. Eating a healthy diet. Avoiding tobacco and drug use. Limiting alcohol use. Practicing safe sex. Taking low-dose aspirin every day. Taking vitamin and mineral supplements as recommended by your health care provider. What happens during an annual well check? The services and screenings done by your health care provider during your annual well check will depend on your age, overall health,  lifestyle risk factors, and family history of disease. Counseling  Your health care provider may ask you questions about your: Alcohol use. Tobacco use. Drug use. Emotional well-being. Home and relationship well-being. Sexual activity. Eating habits. History of falls. Memory and ability to understand (cognition). Work and work Astronomer. Reproductive health. Screening  You may have the following tests or measurements: Height, weight, and BMI. Blood pressure. Lipid and cholesterol levels. These may be checked every 5 years, or more frequently if you are over 32 years old. Skin check. Lung cancer screening. You may have this screening every year starting at age 41 if you have a 30-pack-year history of smoking and currently smoke or have quit within the past 15 years. Fecal occult blood test (FOBT) of the stool. You may have this test every year starting at age 23. Flexible sigmoidoscopy or colonoscopy. You may have a sigmoidoscopy every 5 years or a colonoscopy every 10 years starting at age 37. Hepatitis C blood test. Hepatitis B blood test. Sexually transmitted disease (STD) testing. Diabetes screening. This is done by checking your blood sugar (glucose) after you have not eaten for a while (fasting). You may have this done every 1-3 years. Bone density scan. This is done to screen for osteoporosis. You may have this done starting at age 25. Mammogram. This may be done every 1-2 years. Talk to your health care provider about how often you should have regular mammograms. Talk with your health care provider about your test results, treatment options, and if necessary, the need for more tests. Vaccines  Your health care provider may recommend certain  vaccines, such as: Influenza vaccine. This is recommended every year. Tetanus, diphtheria, and acellular pertussis (Tdap, Td) vaccine. You may need a Td booster every 10 years. Zoster vaccine. You may need this after age  14. Pneumococcal 13-valent conjugate (PCV13) vaccine. One dose is recommended after age 16. Pneumococcal polysaccharide (PPSV23) vaccine. One dose is recommended after age 50. Talk to your health care provider about which screenings and vaccines you need and how often you need them. This information is not intended to replace advice given to you by your health care provider. Make sure you discuss any questions you have with your health care provider. Document Released: 09/12/2015 Document Revised: 05/05/2016 Document Reviewed: 06/17/2015 Elsevier Interactive Patient Education  2017 [employer].  Fall Prevention in the Home Falls can cause injuries. They can happen to people of all ages. There are many things you can do to make your home safe and to help prevent falls. What can I do on the outside of my home? Regularly fix the edges of walkways and driveways and fix any cracks. Remove anything that might make you trip as you walk through a door, such as a raised step or threshold. Trim any bushes or trees on the path to your home. Use bright outdoor lighting. Clear any walking paths of anything that might make someone trip, such as rocks or tools. Regularly check to see if handrails are loose or broken. Make sure that both sides of any steps have handrails. Any raised decks and porches should have guardrails on the edges. Have any leaves, snow, or ice cleared regularly. Use sand or salt on walking paths during winter. Clean up any spills in your garage right away. This includes oil or grease spills. What can I do in the bathroom? Use night lights. Install grab bars by the toilet and in the tub and shower. Do not use towel bars as grab bars. Use non-skid mats or decals in the tub or shower. If you need to sit down in the shower, use a plastic, non-slip stool. Keep the floor dry. Clean up any water that spills on the floor as soon as it happens. Remove soap buildup in the tub or shower  regularly. Attach bath mats securely with double-sided non-slip rug tape. Do not have throw rugs and other things on the floor that can make you trip. What can I do in the bedroom? Use night lights. Make sure that you have a light by your bed that is easy to reach. Do not use any sheets or blankets that are too big for your bed. They should not hang down onto the floor. Have a firm chair that has side arms. You can use this for support while you get dressed. Do not have throw rugs and other things on the floor that can make you trip. What can I do in the kitchen? Clean up any spills right away. Avoid walking on wet floors. Keep items that you use a lot in easy-to-reach places. If you need to reach something above you, use a strong step stool that has a grab bar. Keep electrical cords out of the way. Do not use floor polish or wax that makes floors slippery. If you must use wax, use non-skid floor wax. Do not have throw rugs and other things on the floor that can make you trip. What can I do with my stairs? Do not leave any items on the stairs. Make sure that there are handrails on both sides of the stairs  and use them. Fix handrails that are broken or loose. Make sure that handrails are as long as the stairways. Check any carpeting to make sure that it is firmly attached to the stairs. Fix any carpet that is loose or worn. Avoid having throw rugs at the top or bottom of the stairs. If you do have throw rugs, attach them to the floor with carpet tape. Make sure that you have a light switch at the top of the stairs and the bottom of the stairs. If you do not have them, ask someone to add them for you. What else can I do to help prevent falls? Wear shoes that: Do not have high heels. Have rubber bottoms. Are comfortable and fit you well. Are closed at the toe. Do not wear sandals. If you use a stepladder: Make sure that it is fully opened. Do not climb a closed stepladder. Make sure that  both sides of the stepladder are locked into place. Ask someone to hold it for you, if possible. Clearly mark and make sure that you can see: Any grab bars or handrails. First and last steps. Where the edge of each step is. Use tools that help you move around (mobility aids) if they are needed. These include: Canes. Walkers. Scooters. Crutches. Turn on the lights when you go into a dark area. Replace any light bulbs as soon as they burn out. Set up your furniture so you have a clear path. Avoid moving your furniture around. If any of your floors are uneven, fix them. If there are any pets around you, be aware of where they are. Review your medicines with your doctor. Some medicines can make you feel dizzy. This can increase your chance of falling. Ask your doctor what other things that you can do to help prevent falls. This information is not intended to replace advice given to you by your health care provider. Make sure you discuss any questions you have with your health care provider. Document Released: 06/12/2009 Document Revised: 01/22/2016 Document Reviewed: 09/20/2014 Elsevier Interactive Patient Education  2017 [employer].

## 2023-03-30 NOTE — Patient Instructions (Addendum)
Change dosing of atorvastatin to Monday Thru Friday   Hypertension, Adult Hypertension is another name for high blood pressure. High blood pressure forces your heart to work harder to pump blood. This can cause problems over time. There are two numbers in a blood pressure reading. There is a top number (systolic) over a bottom number (diastolic). It is best to have a blood pressure that is below 120/80. What are the causes? The cause of this condition is not known. Some other conditions can lead to high blood pressure. What increases the risk? Some lifestyle factors can make you more likely to develop high blood pressure: Smoking. Not getting enough exercise or physical activity. Being overweight. Having too much fat, sugar, calories, or salt (sodium) in your diet. Drinking too much alcohol. Other risk factors include: Having any of these conditions: Heart disease. Diabetes. High cholesterol. Kidney disease. Obstructive sleep apnea. Having a family history of high blood pressure and high cholesterol. Age. The risk increases with age. Stress. What are the signs or symptoms? High blood pressure may not cause symptoms. Very high blood pressure (hypertensive crisis) may cause: Headache. Fast or uneven heartbeats (palpitations). Shortness of breath. Nosebleed. Vomiting or feeling like you may vomit (nauseous). Changes in how you see. Very bad chest pain. Feeling dizzy. Seizures. How is this treated? This condition is treated by making healthy lifestyle changes, such as: Eating healthy foods. Exercising more. Drinking less alcohol. Your doctor may prescribe medicine if lifestyle changes do not help enough and if: Your top number is above 130. Your bottom number is above 80. Your personal target blood pressure may vary. Follow these instructions at home: Eating and drinking  If told, follow the DASH eating plan. To follow this plan: Fill one half of your plate at each meal  with fruits and vegetables. Fill one fourth of your plate at each meal with whole grains. Whole grains include whole-wheat pasta, brown rice, and whole-grain bread. Eat or drink low-fat dairy products, such as skim milk or low-fat yogurt. Fill one fourth of your plate at each meal with low-fat (lean) proteins. Low-fat proteins include fish, chicken without skin, eggs, beans, and tofu. Avoid fatty meat, cured and processed meat, or chicken with skin. Avoid pre-made or processed food. Limit the amount of salt in your diet to less than 1,500 mg each day. Do not drink alcohol if: Your doctor tells you not to drink. You are pregnant, may be pregnant, or are planning to become pregnant. If you drink alcohol: Limit how much you have to: 0-1 drink a day for women. 0-2 drinks a day for men. Know how much alcohol is in your drink. In the [place] one drink equals one 12 oz bottle of beer (355 mL), one 5 oz glass of wine (148 mL), or one 1 oz glass of hard liquor (44 mL). Lifestyle  Work with your doctor to stay at a healthy weight or to lose weight. Ask your doctor what the best weight is for you. Get at least 30 minutes of exercise that causes your heart to beat faster (aerobic exercise) most days of the week. This may include walking, swimming, or biking. Get at least 30 minutes of exercise that strengthens your muscles (resistance exercise) at least 3 days a week. This may include lifting weights or doing Pilates. Do not smoke or use any products that contain nicotine or tobacco. If you need help quitting, ask your doctor. Check your blood pressure at home as told by your doctor.  Keep all follow-up visits. Medicines Take over-the-counter and prescription medicines only as told by your doctor. Follow directions carefully. Do not skip doses of blood pressure medicine. The medicine does not work as well if you skip doses. Skipping doses also puts you at risk for problems. Ask your doctor about side  effects or reactions to medicines that you should watch for. Contact a doctor if: You think you are having a reaction to the medicine you are taking. You have headaches that keep coming back. You feel dizzy. You have swelling in your ankles. You have trouble with your vision. Get help right away if: You get a very bad headache. You start to feel mixed up (confused). You feel weak or numb. You feel faint. You have very bad pain in your: Chest. Belly (abdomen). You vomit more than once. You have trouble breathing. These symptoms may be an emergency. Get help right away. Call 911. Do not wait to see if the symptoms will go away. Do not drive yourself to the hospital. Summary Hypertension is another name for high blood pressure. High blood pressure forces your heart to work harder to pump blood. For most people, a normal blood pressure is less than 120/80. Making healthy choices can help lower blood pressure. If your blood pressure does not get lower with healthy choices, you may need to take medicine. This information is not intended to replace advice given to you by your health care provider. Make sure you discuss any questions you have with your health care provider. Document Revised: 06/04/2021 Document Reviewed: 06/04/2021 Elsevier Patient Education  2024 [employer].

## 2023-03-30 NOTE — Progress Notes (Signed)
Subjective:   Jessica Booker is a 82 y.o. female who presents for Medicare Annual (Subsequent) preventive examination.  Visit Complete: In person    Review of Systems     Cardiac Risk Factors include: advanced age (>61men, >68 women);hypertension;obesity (BMI >30kg/m2)     Objective:    Today's Vitals   03/30/23 1421 03/30/23 1435  BP: (!) 160/70 128/60  Pulse: 96   Temp: 98.4 F (36.9 C)   TempSrc: Oral   SpO2: 98%   Weight: 177 lb 3.2 oz (80.4 kg)   Height: 5\' 4"  (1.626 m)    Body mass index is 30.42 kg/m.     03/30/2023    2:28 PM 01/08/2023    9:07 AM 03/10/2022    2:05 PM 12/23/2021    9:46 PM 02/18/2021    9:21 AM 02/16/2021    7:49 AM 11/14/2019    2:18 PM  Advanced Directives  Does Patient Have a Medical Advance Directive? Yes No Yes No Yes Yes Yes  Type of Estate agent of [place];Living will  Healthcare Power of [place];Living will  Healthcare Power of [place];Living will  Healthcare Power of [place];Living will  Copy of Healthcare Power of Attorney in Chart? No - copy requested  No - copy requested  No - copy requested  No - copy requested  Would patient like information on creating a medical advance directive?  No - Patient declined  No - Patient declined       Current Medications (verified) Outpatient Encounter Medications as of 03/30/2023  Medication Sig   albuterol (PROVENTIL) (2.5 MG/3ML) 0.083% nebulizer solution Take 3 mLs (2.5 mg total) by nebulization every 4 (four) hours as needed for wheezing or shortness of breath.   albuterol (VENTOLIN HFA) 108 (90 Base) MCG/ACT inhaler TAKE 2 PUFFS BY MOUTH EVERY 6 HOURS AS NEEDED FOR WHEEZE OR SHORTNESS OF BREATH   aspirin 81 MG tablet Take 81 mg by mouth daily.   atorvastatin (LIPITOR) 10 MG tablet One tab po qd, except Sunday   cetirizine (ZYRTEC) 5 MG tablet TAKE 1 TABLET (5 MG TOTAL) BY MOUTH DAILY.   Cholecalciferol (D3 2000 PO) Take by mouth.   diclofenac sodium (VOLTAREN) 1  % GEL Apply 2 g to 4 g to affected joint up to 4 times daily PRN.   hydroxychloroquine (PLAQUENIL) 200 MG tablet TAKE 1 TABLET BY MOUTH TWICE DAILY MONDAY THROUGH FRIDAY ONLY.   lidocaine (LIDODERM) 5 % Place 1 patch onto the skin daily. Remove & Discard patch within 12 hours or as directed by MD   linaclotide (LINZESS) 72 MCG capsule Take 1 capsule (72 mcg total) by mouth as needed.   NIFEdipine (ADALAT CC) 90 MG 24 hr tablet TAKE 1 TABLET BY MOUTH EVERY DAY   valsartan-hydrochlorothiazide (DIOVAN-HCT) 320-12.5 MG tablet TAKE 1 TABLET BY MOUTH EVERY DAY IN THE MORNING   doxycycline (VIBRAMYCIN) 100 MG capsule Take 1 capsule (100 mg total) by mouth 2 (two) times daily. (Patient not taking: Reported on 03/30/2023)   HYDROcodone bit-homatropine (HYDROMET) 5-1.5 MG/5ML syrup Take 5 mLs by mouth every 6 (six) hours as needed for cough. (Patient not taking: Reported on 03/30/2023)   No facility-administered encounter medications on file as of 03/30/2023.    Allergies (verified) Patient has no known allergies.   History: Past Medical History:  Diagnosis Date   Asthma    Hypertension    Lupus (HCC)    Past Surgical History:  Procedure Laterality Date   ABDOMINAL HYSTERECTOMY  CHOLECYSTECTOMY     SHOULDER ARTHROSCOPY W/ ROTATOR CUFF REPAIR     R shoulder   TONSILLECTOMY     Family History  Problem Relation Age of Onset   Heart attack Mother    Stomach cancer Father    Hypertension Sister    Heart disease Sister    Social History   Socioeconomic History   Marital status: Married    Spouse name: Not on file   Number of children: 2   Years of education: Not on file   Highest education level: Not on file  Occupational History   Occupation: retired  Tobacco Use   Smoking status: Never    Passive exposure: Never   Smokeless tobacco: Never  Vaping Use   Vaping status: Never Used  Substance and Sexual Activity   Alcohol use: No   Drug use: No   Sexual activity: Not Currently     Birth control/protection: Surgical  Other Topics Concern   Not on file  Social History Narrative   Not on file   Social Determinants of Health   Financial Resource Strain: Low Risk  (03/30/2023)   Overall Financial Resource Strain (CARDIA)    Difficulty of Paying Living Expenses: Not hard at all  Food Insecurity: No Food Insecurity (03/30/2023)   Hunger Vital Sign    Worried About Running Out of Food in the Last Year: Never true    Ran Out of Food in the Last Year: Never true  Transportation Needs: No Transportation Needs (03/30/2023)   PRAPARE - Administrator, Civil Service (Medical): No    Lack of Transportation (Non-Medical): No  Physical Activity: Sufficiently Active (03/30/2023)   Exercise Vital Sign    Days of Exercise per Week: 3 days    Minutes of Exercise per Session: 60 min  Stress: No Stress Concern Present (03/30/2023)   [employer] of Occupational Health - Occupational Stress Questionnaire    Feeling of Stress : Not at all  Social Connections: Moderately Integrated (03/30/2023)   Social Connection and Isolation Panel [NHANES]    Frequency of Communication with Friends and Family: More than three times a week    Frequency of Social Gatherings with Friends and Family: Once a week    Attends Religious Services: More than 4 times per year    Active Member of [employer] or Organizations: Yes    Attends Banker Meetings: More than 4 times per year    Marital Status: Widowed    Tobacco Counseling Counseling given: Not Answered   Clinical Intake:  Pre-visit preparation completed: Yes  Pain : No/denies pain     Nutritional Status: BMI > 30  Obese Nutritional Risks: None Diabetes: No  How often do you need to have someone help you when you read instructions, pamphlets, or other written materials from your doctor or pharmacy?: 1 - Never  Interpreter Needed?: No  Information entered by :: NAllen LPN   Activities of Daily Living     03/30/2023    2:22 PM  In your present state of health, do you have any difficulty performing the following activities:  Hearing? 0  Vision? 1  Comment left eye a little blurry sometimes  Difficulty concentrating or making decisions? 0  Walking or climbing stairs? 0  Dressing or bathing? 0  Doing errands, shopping? 0  Preparing Food and eating ? N  Using the Toilet? N  In the past six months, have you accidently leaked urine? N  Do you  have problems with loss of bowel control? N  Managing your Medications? N  Managing your Finances? N  Housekeeping or managing your Housekeeping? N    Patient Care Team: Dorothyann Peng, MD as PCP - General (Internal Medicine) Pollyann Savoy, MD as Consulting Physician (Rheumatology) Antony Contras, MD as Consulting Physician (Ophthalmology)  Indicate any recent Medical Services you may have received from other than [facility] providers in the past year (date may be approximate).     Assessment:   This is a routine wellness examination for B[place].  Hearing/Vision screen Hearing Screening - Comments:: Denies hearing issues Vision Screening - Comments:: Regular eye exams, Dr. Randon Goldsmith  Dietary issues and exercise activities discussed:     Goals Addressed             This Visit's Progress    Patient Stated       03/30/2023, do more exercise, start eating after 6pm       Depression Screen    03/30/2023    2:29 PM 12/14/2022   10:01 AM 07/28/2022    9:58 AM 03/10/2022    2:06 PM 02/18/2021    9:23 AM 11/20/2020    2:17 PM 11/14/2019    2:19 PM  PHQ 2/9 Scores  PHQ - 2 Score 0 0 0 0 0 0 0  PHQ- 9 Score 1 0     1    Fall Risk    03/30/2023    2:29 PM 12/14/2022   10:01 AM 07/28/2022    9:58 AM 03/10/2022    2:06 PM 02/18/2021    9:22 AM  Fall Risk   Falls in the past year? 0 0 0 0 1  Comment     shoe got caught up  Number falls in past yr: 0 0 0 0 0  Injury with Fall? 0 0 0 0 0  Risk for fall due to : Medication side effect No Fall  Risks No Fall Risks Medication side effect Medication side effect  Follow up Falls prevention discussed;Falls evaluation completed Falls evaluation completed Falls evaluation completed Falls evaluation completed;Education provided;Falls prevention discussed Falls evaluation completed;Education provided;Falls prevention discussed    MEDICARE RISK AT HOME:  Medicare Risk at Home - 03/30/23 1429     Any stairs in or around the home? Yes    If so, are there any without handrails? No    Home free of loose throw rugs in walkways, pet beds, electrical cords, etc? Yes    Adequate lighting in your home to reduce risk of falls? Yes    Life alert? No    Use of a cane, walker or w/c? No    Grab bars in the bathroom? Yes    Shower chair or bench in shower? Yes    Elevated toilet seat or a handicapped toilet? Yes             TIMED UP AND GO:  Was the test performed?  Yes  Length of time to ambulate 10 feet: 6 sec Gait slow and steady without use of assistive device    Cognitive Function:        03/30/2023    2:30 PM 03/10/2022    2:07 PM 02/18/2021    9:24 AM 11/14/2019    2:23 PM 11/02/2018    2:40 PM  6CIT Screen  What Year? 0 points 0 points 0 points 0 points 0 points  What month? 0 points 0 points 0 points 0 points 0 points  What time?  0 points 0 points 0 points 0 points 0 points  Count back from 20 0 points 0 points 0 points 0 points 0 points  Months in reverse 0 points 0 points 0 points 0 points 0 points  Repeat phrase 2 points 2 points 4 points 2 points 2 points  Total Score 2 points 2 points 4 points 2 points 2 points    Immunizations Immunization History  Administered Date(s) Administered   Fluad Quad(high Dose 65+) 05/27/2021   Influenza, High Dose Seasonal PF 06/21/2017, 05/29/2018, 05/16/2019   Influenza-Unspecified 05/30/2018, 05/16/2019, 05/13/2020, 06/14/2022   PFIZER Comirnaty(Gray Top)Covid-19 Tri-Sucrose Vaccine 12/25/2020   PFIZER(Purple Top)SARS-COV-2  Vaccination 10/04/2019, 10/30/2019, 05/23/2020   Pfizer Covid-19 Vaccine Bivalent Booster 68yrs & up 07/19/2021, 07/12/2022   Pneumococcal Conjugate-13 07/06/2019   Pneumococcal Polysaccharide-23 09/17/2013   Zoster Recombinant(Shingrix) 05/29/2018, 08/01/2018   Zoster, Live 10/17/2020    TDAP status: Up to date  Flu Vaccine status: Up to date  Pneumococcal vaccine status: Up to date  Covid-19 vaccine status: Completed vaccines  Qualifies for Shingles Vaccine? Yes   Zostavax completed Yes   Shingrix Completed?: Yes  Screening Tests Health Maintenance  Topic Date Due   COVID-19 Vaccine (7 - 2023-24 season) 09/06/2022   INFLUENZA VACCINE  03/31/2023   Medicare Annual Wellness (AWV)  03/29/2024   Pneumonia Vaccine 38+ Years old  Completed   DEXA SCAN  Completed   Zoster Vaccines- Shingrix  Completed   HPV VACCINES  Aged Out   DTaP/Tdap/Td  Discontinued   Hepatitis C Screening  Discontinued    Health Maintenance  Health Maintenance Due  Topic Date Due   COVID-19 Vaccine (7 - 2023-24 season) 09/06/2022    Colorectal cancer screening: No longer required.   Mammogram status: Completed 09/28/2022. Repeat every year  Bone Density status: Completed 08/25/2018.   Lung Cancer Screening: (Low Dose CT Chest recommended if Age 34-80 years, 20 pack-year currently smoking OR have quit w/in 15years.) does not qualify.   Lung Cancer Screening Referral: no  Additional Screening:  Hepatitis C Screening: does not qualify;  Vision Screening: Recommended annual ophthalmology exams for early detection of glaucoma and other disorders of the eye. Is the patient up to date with their annual eye exam?  Yes  Who is the provider or what is the name of the office in which the patient attends annual eye exams? Dr. Randon Goldsmith If pt is not established with a provider, would they like to be referred to a provider to establish care? No .   Dental Screening: Recommended annual dental exams for proper  oral hygiene  Diabetic Foot Exam: n/a  Community Resource Referral / Chronic Care Management: CRR required this visit?  No   CCM required this visit?  No     Plan:     I have personally reviewed and noted the following in the patient's chart:   Medical and social history Use of alcohol, tobacco or illicit drugs  Current medications and supplements including opioid prescriptions. Patient is not currently taking opioid prescriptions. Functional ability and status Nutritional status Physical activity Advanced directives List of other physicians Hospitalizations, surgeries, and ER visits in previous 12 months Vitals Screenings to include cognitive, depression, and falls Referrals and appointments  In addition, I have reviewed and discussed with patient certain preventive protocols, quality metrics, and best practice recommendations. A written personalized care plan for preventive services as well as general preventive health recommendations were provided to patient.     Barb Merino, LPN  03/30/2023   After Visit Summary: in person  Nurse Notes: none

## 2023-03-31 DIAGNOSIS — H2512 Age-related nuclear cataract, left eye: Secondary | ICD-10-CM | POA: Diagnosis not present

## 2023-03-31 DIAGNOSIS — Z79899 Other long term (current) drug therapy: Secondary | ICD-10-CM | POA: Diagnosis not present

## 2023-03-31 DIAGNOSIS — Z961 Presence of intraocular lens: Secondary | ICD-10-CM | POA: Diagnosis not present

## 2023-03-31 DIAGNOSIS — H524 Presbyopia: Secondary | ICD-10-CM | POA: Diagnosis not present

## 2023-03-31 DIAGNOSIS — H25012 Cortical age-related cataract, left eye: Secondary | ICD-10-CM | POA: Diagnosis not present

## 2023-04-03 DIAGNOSIS — E7800 Pure hypercholesterolemia, unspecified: Secondary | ICD-10-CM | POA: Insufficient documentation

## 2023-04-03 DIAGNOSIS — E6609 Other obesity due to excess calories: Secondary | ICD-10-CM | POA: Insufficient documentation

## 2023-04-03 DIAGNOSIS — G4762 Sleep related leg cramps: Secondary | ICD-10-CM | POA: Insufficient documentation

## 2023-04-03 DIAGNOSIS — I700 Atherosclerosis of aorta: Secondary | ICD-10-CM | POA: Insufficient documentation

## 2023-04-03 DIAGNOSIS — I119 Hypertensive heart disease without heart failure: Secondary | ICD-10-CM | POA: Insufficient documentation

## 2023-04-03 NOTE — Assessment & Plan Note (Signed)
She is encouraged to aim for at least 150 minutes of exercise/week.

## 2023-04-03 NOTE — Assessment & Plan Note (Signed)
Chronic, LDL goal is less than 70.  She will continue with atorvastatin. Unfortunately, she doesn't tolerate daily dosing due to leg cramps.

## 2023-04-03 NOTE — Assessment & Plan Note (Signed)
Chronic, fair control. Goal BP<120/80.  She will continue with nifedipine 90mg  daily and valsartan/hct 320/12.5mg  daily. She is reminded to follow a low sodium diet.

## 2023-04-03 NOTE — Assessment & Plan Note (Signed)
She is encouraged to develop a bedtime routine. If persistent, will consider trazodone or Belsomra nightly. Reminded to avoid watching TV and using her cellphone within an hour of going to bed.

## 2023-04-03 NOTE — Assessment & Plan Note (Signed)
She is encouraged to stay well hydrated. She would likely benefit from magnesium supplementation nightly. She should also incorporate coconut water into her diet.

## 2023-04-03 NOTE — Assessment & Plan Note (Addendum)
Chronic, LDL goal < 70.  Chronic, has debilitating leg cramps with atorvastatin daily, except Sundays. Pt advised to take M-F, skipping Sat/Sun.

## 2023-04-06 ENCOUNTER — Ambulatory Visit: Payer: Medicare PPO | Admitting: Physician Assistant

## 2023-04-07 ENCOUNTER — Ambulatory Visit: Payer: Medicare PPO | Admitting: Rheumatology

## 2023-04-07 DIAGNOSIS — M5030 Other cervical disc degeneration, unspecified cervical region: Secondary | ICD-10-CM

## 2023-04-07 DIAGNOSIS — G8929 Other chronic pain: Secondary | ICD-10-CM

## 2023-04-07 DIAGNOSIS — R202 Paresthesia of skin: Secondary | ICD-10-CM

## 2023-04-07 DIAGNOSIS — M170 Bilateral primary osteoarthritis of knee: Secondary | ICD-10-CM

## 2023-04-07 DIAGNOSIS — M5136 Other intervertebral disc degeneration, lumbar region: Secondary | ICD-10-CM

## 2023-04-07 DIAGNOSIS — M3219 Other organ or system involvement in systemic lupus erythematosus: Secondary | ICD-10-CM

## 2023-04-07 DIAGNOSIS — Z8669 Personal history of other diseases of the nervous system and sense organs: Secondary | ICD-10-CM

## 2023-04-07 DIAGNOSIS — E7800 Pure hypercholesterolemia, unspecified: Secondary | ICD-10-CM

## 2023-04-07 DIAGNOSIS — I351 Nonrheumatic aortic (valve) insufficiency: Secondary | ICD-10-CM

## 2023-04-07 DIAGNOSIS — Z8679 Personal history of other diseases of the circulatory system: Secondary | ICD-10-CM

## 2023-04-07 DIAGNOSIS — E559 Vitamin D deficiency, unspecified: Secondary | ICD-10-CM

## 2023-04-07 DIAGNOSIS — I7300 Raynaud's syndrome without gangrene: Secondary | ICD-10-CM

## 2023-04-07 DIAGNOSIS — Z79899 Other long term (current) drug therapy: Secondary | ICD-10-CM

## 2023-04-07 DIAGNOSIS — M19071 Primary osteoarthritis, right ankle and foot: Secondary | ICD-10-CM

## 2023-04-07 DIAGNOSIS — M19041 Primary osteoarthritis, right hand: Secondary | ICD-10-CM

## 2023-04-11 NOTE — Progress Notes (Signed)
Office Visit Note  Patient: Jessica Booker             Date of Birth: 1941-02-13           MRN: [id number]             PCP: Dorothyann Peng, MD Referring: Dorothyann Peng, MD Visit Date: 04/19/2023 Occupation: @GUAROCC @  Subjective:  Medication management  History of Present Illness: GABRIEL PROCELL is a 82 y.o. female with systemic lupus erythematosus and osteoarthritis.  Patient denies having a flare of lupus.  She states for the last 1 year she has had off-and-on discomfort in her left knee joint.  Recently she has been having increased discomfort when she comes out of the car.  She does not have discomfort when she walks.  She continues to have intermittent tingling in her right hand.  She states the symptoms have improved.  The Raynauds is currently not active.  Patient states that she has been experiencing intermittent bilateral ring trigger finger.  Left shoulder joint pain improved after the cortisone injection in March 2024.  She had no recurrence of pain.  She continues to have some stiffness in her hands and her feet.  The neck and lower back pain is manageable.  She continues to have dry mouth and mild Raynaud's symptoms.  There is no history of oral ulcers, nasal ulcers, photosensitivity, lymphadenopathy.    Activities of Daily Living:  Patient reports morning stiffness for 0 minutes.   Patient Reports nocturnal pain.  Difficulty dressing/grooming: Denies Difficulty climbing stairs: Denies Difficulty getting out of chair: Denies Difficulty using hands for taps, buttons, cutlery, and/or writing: Denies  Review of Systems  Constitutional:  Negative for fatigue.  HENT:  Positive for mouth dryness. Negative for mouth sores.   Eyes:  Negative for dryness.  Respiratory:  Negative for shortness of breath.   Cardiovascular:  Negative for chest pain and palpitations.  Gastrointestinal:  Negative for blood in stool, constipation and diarrhea.  Endocrine: Negative for increased  urination.  Genitourinary:  Negative for involuntary urination.  Musculoskeletal:  Positive for joint pain, joint pain, myalgias and myalgias. Negative for gait problem, joint swelling, muscle weakness, morning stiffness and muscle tenderness.  Skin:  Positive for color change. Negative for rash, hair loss and sensitivity to sunlight.  Allergic/Immunologic: Negative for susceptible to infections.  Neurological:  Negative for dizziness and headaches.  Hematological:  Negative for swollen glands.  Psychiatric/Behavioral:  Positive for sleep disturbance. Negative for depressed mood. The patient is not nervous/anxious.     PMFS History:  Patient Active Problem List   Diagnosis Date Noted   Sleep related leg cramps 04/03/2023   Atherosclerosis of aorta (HCC) 04/03/2023   Hypertensive heart disease without heart failure 04/03/2023   Pure hypercholesterolemia 04/03/2023   Class 1 obesity due to excess calories with serious comorbidity and body mass index (BMI) of 30.0 to 30.9 in adult 04/03/2023   Wheezing 01/10/2023   Acute cough 01/10/2023   Pneumonia of right middle lobe due to infectious organism 01/10/2023   Moderate persistent asthma with acute exacerbation 01/10/2023   Primary insomnia 08/31/2022   Dyspnea 04/11/2019   Belching 04/11/2019   Constipation 04/11/2019   Essential hypertension 11/02/2018   Lupus (HCC) 11/02/2018   Primary osteoarthritis of both knees 06/06/2018   Cough variant asthma 08/25/2017   Systemic lupus erythematosus (HCC) 02/18/2017   High risk medication use 02/18/2017   Raynaud's disease without gangrene 02/18/2017   DDD (degenerative disc disease),  lumbar 02/18/2017   Primary osteoarthritis of both hands 02/18/2017   Primary osteoarthritis of both feet 02/18/2017   Vitamin D deficiency 02/18/2017   History of hypertension 02/18/2017   History of neuropathy 02/18/2017    Past Medical History:  Diagnosis Date   Asthma    Hypertension    Lupus (HCC)      Family History  Problem Relation Age of Onset   Heart attack Mother    Stomach cancer Father    Hypertension Sister    Heart disease Sister    Past Surgical History:  Procedure Laterality Date   ABDOMINAL HYSTERECTOMY     CHOLECYSTECTOMY     SHOULDER ARTHROSCOPY W/ ROTATOR CUFF REPAIR     R shoulder   TONSILLECTOMY     Social History   Social History Narrative   Not on file   Immunization History  Administered Date(s) Administered   Fluad Quad(high Dose 65+) 05/27/2021   Influenza, High Dose Seasonal PF 06/21/2017, 05/29/2018, 05/16/2019   Influenza-Unspecified 05/30/2018, 05/16/2019, 05/13/2020, 06/14/2022   PFIZER Comirnaty(Gray Top)Covid-19 Tri-Sucrose Vaccine 12/25/2020   PFIZER(Purple Top)SARS-COV-2 Vaccination 10/04/2019, 10/30/2019, 05/23/2020   Pfizer Covid-19 Vaccine Bivalent Booster 47yrs & up 07/19/2021, 07/12/2022   Pneumococcal Conjugate-13 07/06/2019   Pneumococcal Polysaccharide-23 09/17/2013   Zoster Recombinant(Shingrix) 05/29/2018, 08/01/2018   Zoster, Live 10/17/2020     Objective: Vital Signs: BP (!) 145/74 (BP Location: Left Arm, Patient Position: Sitting, Cuff Size: Normal)   Pulse 69   Resp 14   Ht 5' 4.5" (1.638 m)   Wt 181 lb (82.1 kg)   BMI 30.59 kg/m    Physical Exam Vitals and nursing note reviewed.  Constitutional:      Appearance: She is well-developed.  HENT:     Head: Normocephalic and atraumatic.  Eyes:     Conjunctiva/sclera: Conjunctivae normal.  Cardiovascular:     Rate and Rhythm: Normal rate and regular rhythm.     Heart sounds: Normal heart sounds.  Pulmonary:     Effort: Pulmonary effort is normal.     Breath sounds: Normal breath sounds.  Abdominal:     General: Bowel sounds are normal.     Palpations: Abdomen is soft.  Musculoskeletal:     Cervical back: Normal range of motion.  Lymphadenopathy:     Cervical: No cervical adenopathy.  Skin:    General: Skin is warm and dry.     Capillary Refill: Capillary  refill takes less than 2 seconds.  Neurological:     Mental Status: She is alert and oriented to person, place, and time.  Psychiatric:        Behavior: Behavior normal.      Musculoskeletal Exam: She had limited lateral rotation of the cervical spine without discomfort.  There was no tenderness over thoracic or lumbar spine.  Shoulder joints, elbow joints, wrist joints and MCPs PIPs and DIPs were in good range of motion.  She had bilateral PIP and DIP thickening with no synovitis.  Hip joints and knee joints were in good range of motion.  She had crepitus in the bilateral knee joints without any warmth swelling or effusion.  There was no tenderness over ankles or MTPs.  CDAI Exam: CDAI Score: -- Patient Global: --; Provider Global: -- Swollen: --; Tender: -- Joint Exam 04/19/2023   No joint exam has been documented for this visit   There is currently no information documented on the homunculus. Go to the Rheumatology activity and complete the homunculus joint exam.  Investigation:  No additional findings.  Imaging: No results found.  Recent Labs: Lab Results  Component Value Date   WBC 7.6 12/14/2022   HGB 11.3 12/14/2022   PLT 299 12/14/2022   NA 138 03/30/2023   K 3.9 03/30/2023   CL 100 03/30/2023   CO2 23 03/30/2023   GLUCOSE 87 03/30/2023   BUN 16 03/30/2023   CREATININE 0.98 03/30/2023   BILITOT 0.3 11/04/2022   ALKPHOS 74 12/23/2021   AST 16 11/04/2022   ALT 11 11/04/2022   PROT 7.7 11/04/2022   ALBUMIN 4.2 12/23/2021   CALCIUM 9.9 03/30/2023   GFRAA 73 06/27/2020    Speciality Comments: PLQ Eye Exam: 03/31/2023 WNL @ [facility] Follow up in 1 year.   Procedures:  No procedures performed Allergies: Patient has no known allergies.   Assessment / Plan:     Visit Diagnoses: Other systemic lupus erythematosus with other organ involvement (HCC) - -ANA, positive Smith, and positive Ro and RNP: -Patient denies having a flare of rheumatoid arthritis.   She continues to have mild Raynaud's symptoms and sicca symptoms.  She denies any history of oral ulcers, nasal ulcers, malar rash, photosensitivity, lymphadenopathy or inflammatory arthritis.  She has been taking hydroxychloroquine without any interruption.  I will obtain labs today.  Labs on November 04, 2022 CBC and CMP were normal.  Sed rate and complements were normal.  Double-stranded DNA was negative.  Plan: Protein / creatinine ratio, urine, Anti-DNA antibody, double-stranded, C3 and C4, Sedimentation rate  High risk medication use - Plaquenil 200 mg 1 tablet by mouth twice daily M-F. PLQ Eye Exam: 03/31/2023 - Plan: CBC with Differential/Platelet, COMPLETE METABOLIC PANEL WITH GFR today and every 5 months.  Information about immunization was placed in the AVS.  Raynaud's disease without gangrene-currently not active.  Warm clothing and keeping good temperature warm was discussed.  Right hand paresthesia  Chronic left shoulder pain -I injected her left subacromial bursa on November 04, 2022.  She had an excellent response and is currently asymptomatic.   X-rays of the left shoulder on 07/31/2021 were consistent with [facility] joint arthritis.MRI of the shoulder joint from May 20, 2022  Primary osteoarthritis of both hands-has bilateral PIP and DIP thickening.  Bilateral CMC thickening was noted.  Joint protection was discussed.  Trigger finger, left ring finger-she has been rinsing symptoms of bilateral ring trigger finger.  Use of topical Voltaren gel was advised.  Also discussed possible cortisone injection in the future if the symptoms persist.  Trigger finger, right ring finger  Primary osteoarthritis of both knees-she has been experiencing crepitus in her left knee joint and occasional discomfort when she comes out of the car.  She has no discomfort walking.  No warmth swelling or effusion was noted.  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  Lower extremity muscle strength  exercises were discussed.  A handout was placed in the AVS.  Primary osteoarthritis of both feet-she is off-and-on discomfort.  No tenderness was present today.  DDD (degenerative disc disease), cervical-she had limited range of motion without discomfort.  DDD (degenerative disc disease), lumbar-she denied any pain today.  Other medical problems are listed as follows:  History of neuropathy  History of hypertension-her blood pressure was elevated at 145/74.  Repeat blood pressure was 159/62.  Patient was advised to monitor blood pressure closely and follow-up with the PCP.  Osteoporosis screening-I do not see a DEXA scan in the system.  I advised patient to get DEXA scan.  Patient  would like to discuss with Dr. Allyne Gee.  Vitamin D deficiency-vitamin D was normal at 44.1 on September 08, 2022.  Pure hypercholesterolemia  Moderate aortic valve insufficiency - patient has a heart murmur and is followed by Dr. Jacinto Halim.  Orders: Orders Placed This Encounter  Procedures   Protein / creatinine ratio, urine   CBC with Differential/Platelet   COMPLETE METABOLIC PANEL WITH GFR   Anti-DNA antibody, double-stranded   C3 and C4   Sedimentation rate   No orders of the defined types were placed in this encounter.    Follow-Up Instructions: Return in about 5 months (around 09/19/2023) for Systemic lupus.   Pollyann Savoy, MD  Note - This record has been created using Animal nutritionist.  Chart creation errors have been sought, but may not always  have been located. Such creation errors do not reflect on  the standard of medical care.

## 2023-04-19 ENCOUNTER — Ambulatory Visit: Payer: Medicare PPO | Attending: Rheumatology | Admitting: Rheumatology

## 2023-04-19 ENCOUNTER — Encounter: Payer: Self-pay | Admitting: Rheumatology

## 2023-04-19 VITALS — BP 159/62 | HR 80 | Resp 14 | Ht 64.5 in | Wt 181.0 lb

## 2023-04-19 DIAGNOSIS — M3219 Other organ or system involvement in systemic lupus erythematosus: Secondary | ICD-10-CM

## 2023-04-19 DIAGNOSIS — M5136 Other intervertebral disc degeneration, lumbar region: Secondary | ICD-10-CM

## 2023-04-19 DIAGNOSIS — G8929 Other chronic pain: Secondary | ICD-10-CM

## 2023-04-19 DIAGNOSIS — M19042 Primary osteoarthritis, left hand: Secondary | ICD-10-CM

## 2023-04-19 DIAGNOSIS — M19072 Primary osteoarthritis, left ankle and foot: Secondary | ICD-10-CM

## 2023-04-19 DIAGNOSIS — M65341 Trigger finger, right ring finger: Secondary | ICD-10-CM

## 2023-04-19 DIAGNOSIS — M19041 Primary osteoarthritis, right hand: Secondary | ICD-10-CM

## 2023-04-19 DIAGNOSIS — M5030 Other cervical disc degeneration, unspecified cervical region: Secondary | ICD-10-CM

## 2023-04-19 DIAGNOSIS — Z79899 Other long term (current) drug therapy: Secondary | ICD-10-CM

## 2023-04-19 DIAGNOSIS — R202 Paresthesia of skin: Secondary | ICD-10-CM

## 2023-04-19 DIAGNOSIS — M170 Bilateral primary osteoarthritis of knee: Secondary | ICD-10-CM

## 2023-04-19 DIAGNOSIS — I7300 Raynaud's syndrome without gangrene: Secondary | ICD-10-CM

## 2023-04-19 DIAGNOSIS — M65342 Trigger finger, left ring finger: Secondary | ICD-10-CM

## 2023-04-19 DIAGNOSIS — M51369 Other intervertebral disc degeneration, lumbar region without mention of lumbar back pain or lower extremity pain: Secondary | ICD-10-CM

## 2023-04-19 DIAGNOSIS — Z8669 Personal history of other diseases of the nervous system and sense organs: Secondary | ICD-10-CM

## 2023-04-19 DIAGNOSIS — E7800 Pure hypercholesterolemia, unspecified: Secondary | ICD-10-CM

## 2023-04-19 DIAGNOSIS — M19071 Primary osteoarthritis, right ankle and foot: Secondary | ICD-10-CM

## 2023-04-19 DIAGNOSIS — E559 Vitamin D deficiency, unspecified: Secondary | ICD-10-CM

## 2023-04-19 DIAGNOSIS — I351 Nonrheumatic aortic (valve) insufficiency: Secondary | ICD-10-CM

## 2023-04-19 DIAGNOSIS — M25512 Pain in left shoulder: Secondary | ICD-10-CM

## 2023-04-19 DIAGNOSIS — Z8679 Personal history of other diseases of the circulatory system: Secondary | ICD-10-CM

## 2023-04-19 DIAGNOSIS — Z13820 Encounter for screening for osteoporosis: Secondary | ICD-10-CM

## 2023-04-19 NOTE — Patient Instructions (Addendum)
Vaccines You are taking a medication(s) that can suppress your immune system.  The following immunizations are recommended: Flu annually Covid-19  RSV Td/Tdap (tetanus, diphtheria, pertussis) every 10 years Pneumonia (Prevnar 15 then Pneumovax 23 at least 1 year apart.  Alternatively, can take Prevnar 20 without needing additional dose) Shingrix: 2 doses from 4 weeks to 6 months apart  Please check with your PCP to make sure you are up to date.  Exercises for Chronic Knee Pain Chronic knee pain is pain that lasts longer than 3 months. For most people with chronic knee pain, exercise and weight loss is an important part of treatment. Your health care provider may want you to focus on: Making the muscles that support your knee stronger. This can take pressure off your knee and reduce pain. Preventing knee stiffness. How far you can move your knee, keeping it there or making it farther. Losing weight (if this applies) to take pressure off your knee, lower your risk for injury, and make it easier for you to exercise. Your provider will help you make an exercise program that fits your needs and physical abilities. Below are simple, low-impact exercises you can do at home. Ask your provider or physical therapist how often you should do your exercise program and how many times to repeat each exercise. General safety tips  Get your provider's approval before doing any exercises. Start slowly and stop any time you feel pain. Do not exercise if your knee pain is flaring up. Warm up first. Stretching a cold muscle can cause an injury. Do 5-10 minutes of easy movement or light stretching before beginning your exercises. Do 5-10 minutes of low-impact activity (like walking or cycling) before starting strengthening exercises. Contact your provider any time you have pain during or after exercising. Exercise can cause discomfort but should not be painful. It is normal to be a little stiff or sore after  exercising. Stretching and range-of-motion exercises Front thigh stretch  Stand up straight and support your body by holding on to a chair or resting one hand on a wall. With your legs straight and close together, bend one knee to lift your heel up toward your butt. Using one hand for support, grab your ankle with your free hand. Pull your foot up closer toward your butt to feel the stretch in front of your thigh. Hold the stretch for 30 seconds. Repeat __________ times. Complete this exercise __________ times a day. Back thigh stretch  Sit on the floor with your back straight and your legs out straight in front of you. Place the palms of your hands on the floor and slide them toward your feet as you bend at the hip. Try to touch your nose to your knees and feel the stretch in the back of your thighs. Hold for 30 seconds. Repeat __________ times. Complete this exercise __________ times a day. Calf stretch  Stand facing a wall. Place the palms of your hands flat against the wall, arms extended, and lean slightly against the wall. Get into a lunge position with one leg bent at the knee and the other leg stretched out straight behind you. Keep both feet facing the wall and increase the bend in your knee while keeping the heel of the other leg flat on the ground. You should feel the stretch in your calf. Hold for 30 seconds. Repeat __________ times. Complete this exercise __________ times a day. Strengthening exercises Straight leg lift  Lie on your back with one knee  bent and the other leg out straight. Slowly lift the straight leg without bending the knee. Lift until your foot is about 12 inches (30 cm) off the floor. Hold for 3-5 seconds and slowly lower your leg. Repeat __________ times. Complete this exercise __________ times a day. Single leg dip  Stand between two chairs and put both hands on the backs of the chairs for support. Extend one leg out straight with your body weight  resting on the heel of the standing leg. Slowly bend your standing knee to dip your body to the level that is comfortable for you. Hold for 3-5 seconds. Repeat __________ times. Complete this exercise __________ times a day. Hamstring curls  Stand straight, knees close together, facing the back of a chair. Hold on to the back of a chair with both hands. Keep one leg straight. Bend the other knee while bringing the heel up toward the butt until the knee is bent at a 90-degree angle (right angle). Hold for 3-5 seconds. Repeat __________ times. Complete this exercise __________ times a day. Wall squat  Stand straight with your back, hips, and head against a wall. Step forward one foot at a time with your back still against the wall. Your feet should be 2 feet (61 cm) from the wall at shoulder width. Keeping your back, hips, and head against the wall, slide down the wall to as close to a sitting position as you can get. Hold for 5-10 seconds, then slowly slide back up. Repeat __________ times. Complete this exercise __________ times a day. Step-ups  Stand in front of a sturdy platform or stool that is about 6 inches (15 cm) high. Slowly step up with your left / right foot, keeping your knee in line with your hip and foot. Do not let your knee bend so far that you cannot see your toes. Hold on to a chair for balance, but do not use it for support. Slowly unlock your knee and lower yourself to the starting position. Repeat __________ times. Complete this exercise __________ times a day. Contact a health care provider if: Your exercises cause pain. Your pain is worse after you exercise. Your pain prevents you from doing your exercises. This information is not intended to replace advice given to you by your health care provider. Make sure you discuss any questions you have with your health care provider. Document Revised: 08/31/2022 Document Reviewed: 08/31/2022 Elsevier Patient Education   2024 [employer].

## 2023-04-20 LAB — COMPLETE METABOLIC PANEL WITH GFR
AG Ratio: 1.2 (calc) (ref 1.0–2.5)
ALT: 13 U/L (ref 6–29)
AST: 16 U/L (ref 10–35)
Albumin: 4.3 g/dL (ref 3.6–5.1)
Alkaline phosphatase (APISO): 84 U/L (ref 37–153)
BUN: 18 mg/dL (ref 7–25)
CO2: 25 mmol/L (ref 20–32)
Calcium: 9.8 mg/dL (ref 8.6–10.4)
Chloride: 104 mmol/L (ref 98–110)
Creat: 0.92 mg/dL (ref 0.60–0.95)
Globulin: 3.5 g/dL (ref 1.9–3.7)
Glucose, Bld: 86 mg/dL (ref 65–99)
Potassium: 3.8 mmol/L (ref 3.5–5.3)
Sodium: 137 mmol/L (ref 135–146)
Total Bilirubin: 0.4 mg/dL (ref 0.2–1.2)
Total Protein: 7.8 g/dL (ref 6.1–8.1)
eGFR: 62 mL/min/{1.73_m2} (ref >=60)

## 2023-04-20 LAB — C3 AND C4
C3 Complement: 141 mg/dL
C4 Complement: 27 mg/dL

## 2023-04-20 LAB — PROTEIN / CREATININE RATIO, URINE
Creatinine, Urine: 12 mg/dL — ABNORMAL LOW (ref 20–275)
Total Protein, Urine: <4 mg/dL — ABNORMAL LOW (ref 5–24)

## 2023-04-20 LAB — CBC WITH DIFFERENTIAL/PLATELET
Absolute Monocytes: 679 {cells}/uL (ref 200–950)
Basophils Absolute: 24 {cells}/uL (ref 0–200)
Basophils Relative: 0.3 %
Eosinophils Absolute: 142 {cells}/uL (ref 15–500)
Eosinophils Relative: 1.8 %
HCT: 33.9 % — ABNORMAL LOW (ref 35.0–45.0)
Hemoglobin: 10.9 g/dL — ABNORMAL LOW (ref 11.7–15.5)
Lymphs Abs: 2733 {cells}/uL (ref 850–3900)
MCH: 26.7 pg — ABNORMAL LOW (ref 27.0–33.0)
MCHC: 32.2 g/dL (ref 32.0–36.0)
MCV: 82.9 fL (ref 80.0–100.0)
MPV: 10.4 fL (ref 7.5–12.5)
Monocytes Relative: 8.6 %
Neutro Abs: 4321 {cells}/uL (ref 1500–7800)
Neutrophils Relative %: 54.7 %
Platelets: 275 10*3/uL (ref 140–400)
RBC: 4.09 10*6/uL (ref 3.80–5.10)
RDW: 13.2 % (ref 11.0–15.0)
Total Lymphocyte: 34.6 %
WBC: 7.9 10*3/uL (ref 3.8–10.8)

## 2023-04-20 LAB — ANTI-DNA ANTIBODY, DOUBLE-STRANDED: ds DNA Ab: 2 [IU]/mL

## 2023-04-20 LAB — SEDIMENTATION RATE: Sed Rate: 28 mm/h (ref 0–30)

## 2023-04-21 NOTE — Progress Notes (Signed)
Sed rate normal, complements normal, double-stranded DNA negative, CMP normal, CBC shows low hemoglobin at 10.9.  Patient should take multivitamin with iron.  Please forward results to her PCP for the evaluation of anemia.  Labs do not indicated autoimmune disease flare.

## 2023-04-25 ENCOUNTER — Other Ambulatory Visit: Payer: Self-pay | Admitting: Physician Assistant

## 2023-04-25 DIAGNOSIS — M3219 Other organ or system involvement in systemic lupus erythematosus: Secondary | ICD-10-CM

## 2023-04-25 NOTE — Telephone Encounter (Signed)
Last Fill: 01/31/2023  Eye exam: 03/31/2023   Labs: 04/19/2023 Sed rate normal, complements normal, double-stranded DNA negative, CMP normal, CBC shows low hemoglobin at 10.9.  Patient should take multivitamin with iron.  Please forward results to her PCP for the evaluation of anemia.  Labs do not indicated autoimmune disease flare.   Next Visit: 09/19/2023  Last Visit: 04/19/2023  DX: Other systemic lupus erythematosus with other organ involvement    Current Dose per office note 04/19/2023: Plaquenil 200 mg 1 tablet by mouth twice daily M-F   Okay to refill Plaquenil?

## 2023-04-27 IMAGING — CT CT RENAL STONE PROTOCOL
2 of 4 series · 17 of 46 positions shown, 19 images · non-contrast
Comparison: CT 06/06/2019

CLINICAL DATA: Flank pain



[Series 2: stone full · axial · 0.72mm/px · z∈[+706,+1082]mm · 14 of 83 slices shown, 16 images]
[im 4/83  soft-tissue]
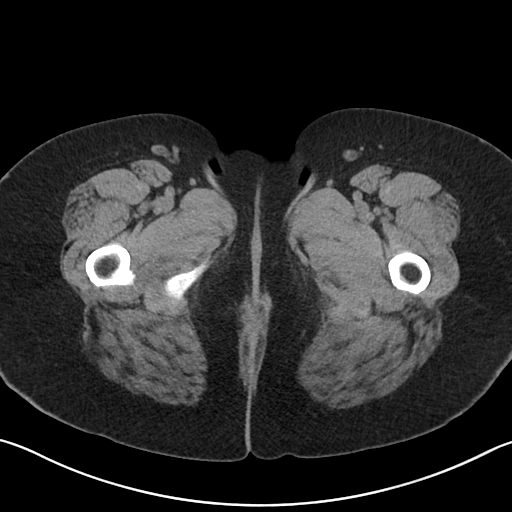
[im 4/83  bone]
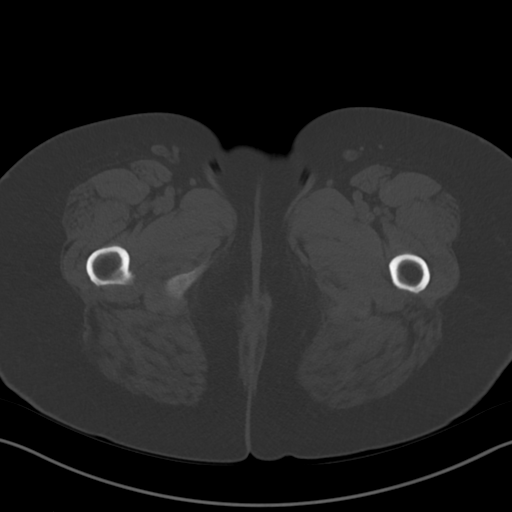
[im 11/83  soft-tissue]
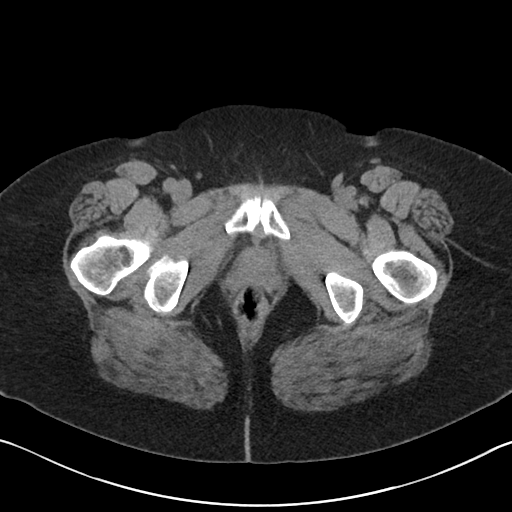
[im 15/83  soft-tissue]
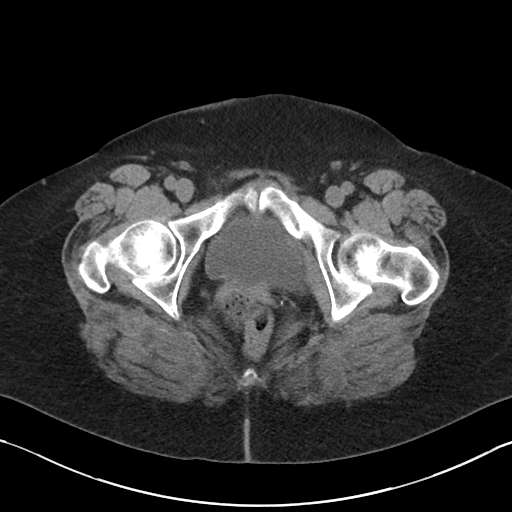
[im 22/83  soft-tissue]
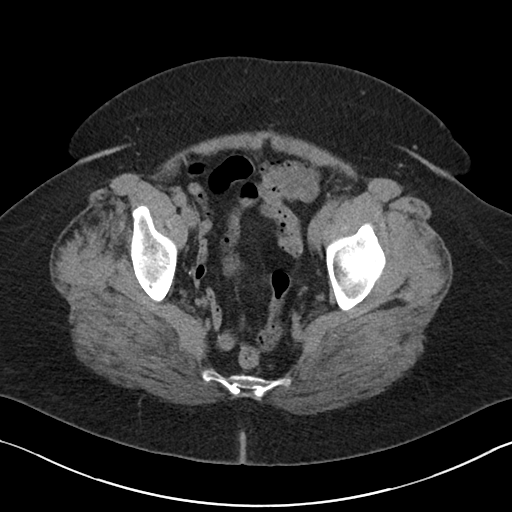
[im 29/83  soft-tissue]
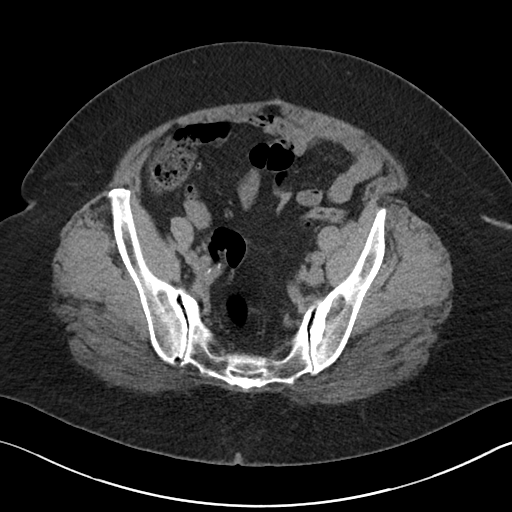
[im 33/83  soft-tissue]
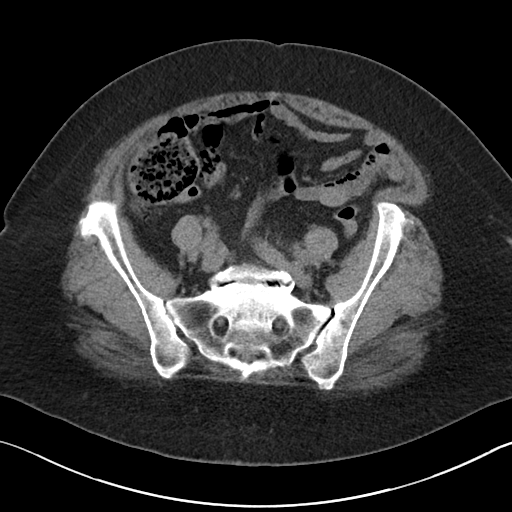
[im 40/83  soft-tissue]
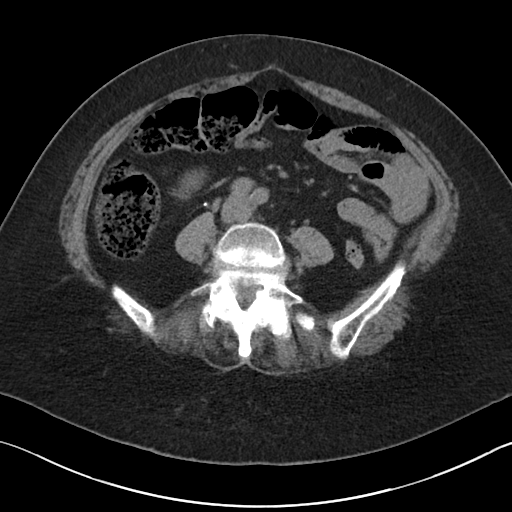
[im 43/83  soft-tissue]
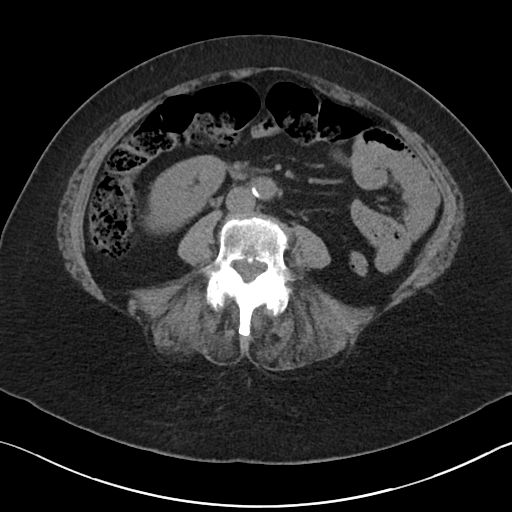
[im 50/83  soft-tissue]
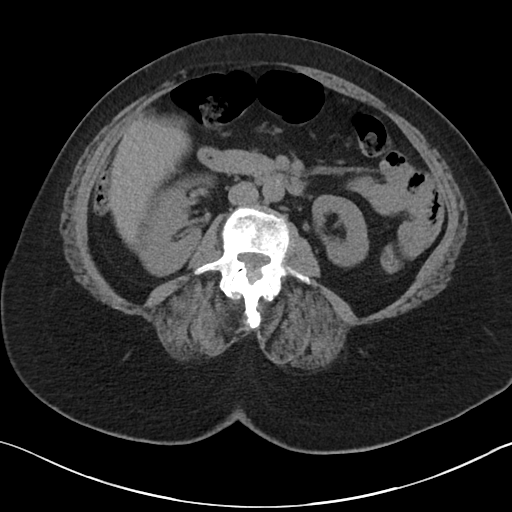
[im 50/83  bone]
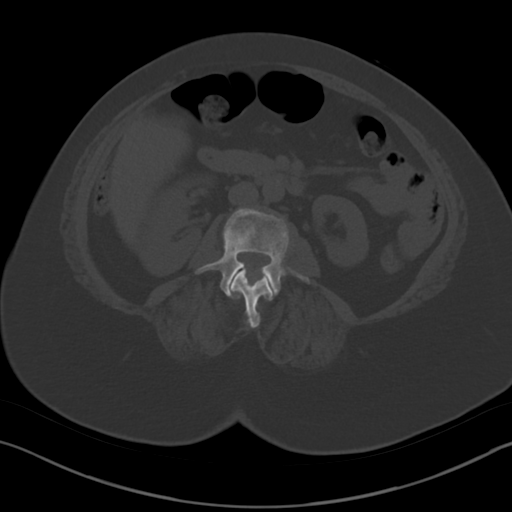
[im 54/83  soft-tissue]
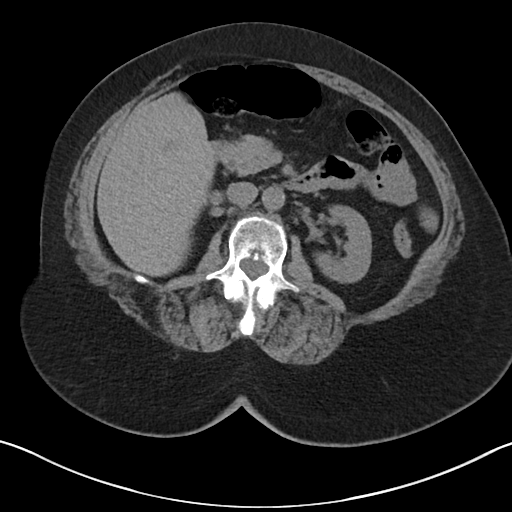
[im 61/83  soft-tissue]
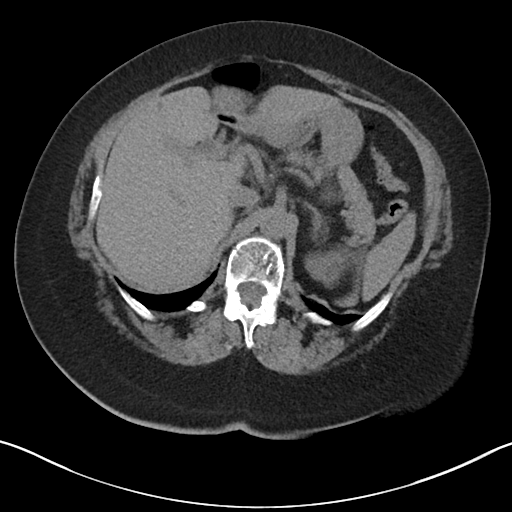
[im 68/83  soft-tissue]
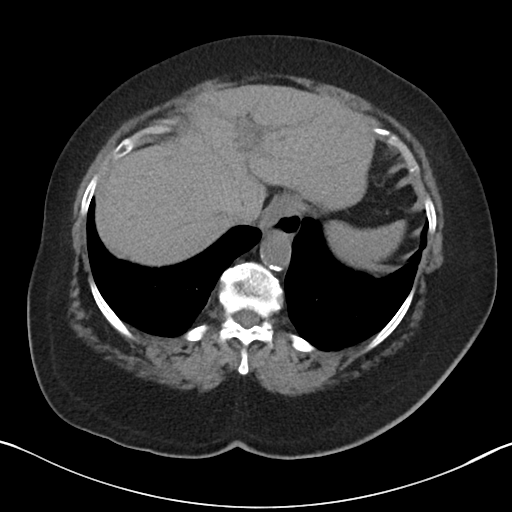
[im 72/83  soft-tissue]
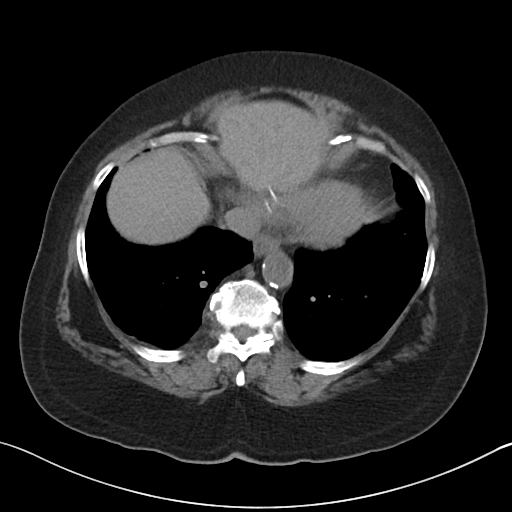
[im 79/83  soft-tissue]
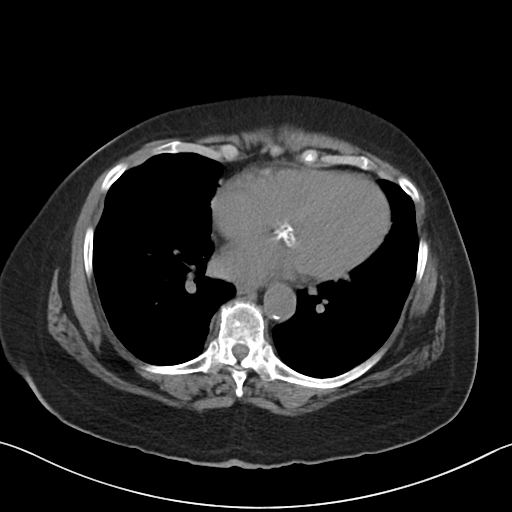

[Series 5: coronal · coronal · 0.86mm/px · 3 of 109 slices shown]
[im 37/109  soft-tissue]
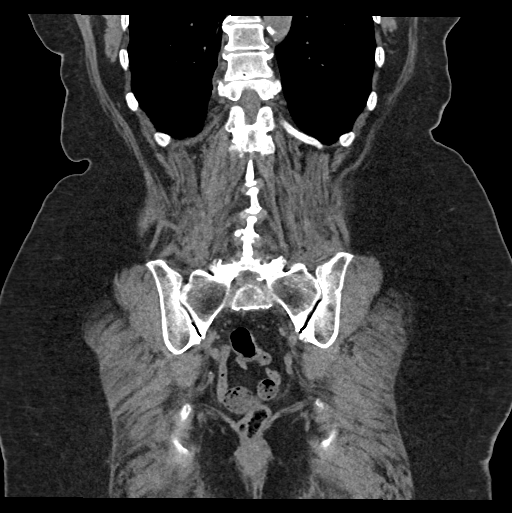
[im 49/109  soft-tissue]
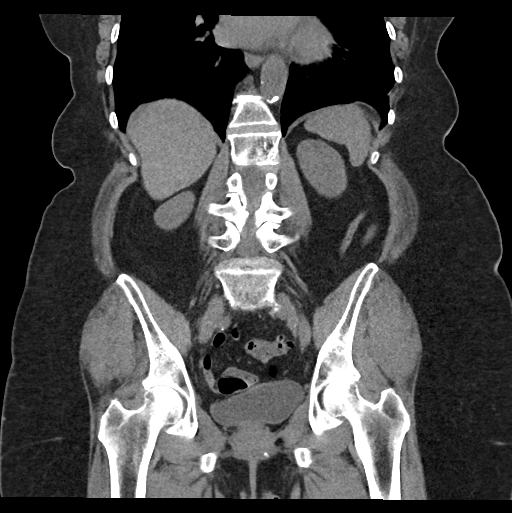
[im 61/109  soft-tissue]
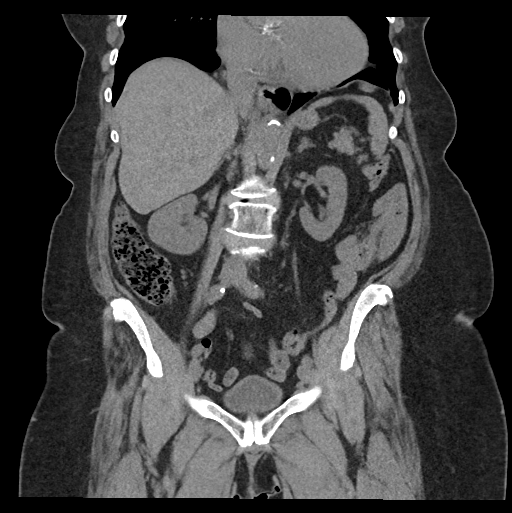

[17 of 46 positions shown; findings below may reference images not displayed]

FINDINGS: Lower chest: Lung bases demonstrate no acute consolidation or
effusion. Linear scarring or atelectasis at the left base. Mild
cardiomegaly. Coronary vascular calcification. Small hiatal hernia

Hepatobiliary: No focal liver abnormality is seen. Status post
cholecystectomy. No biliary dilatation.

Pancreas: Unremarkable. No pancreatic ductal dilatation or
surrounding inflammatory changes.

Spleen: Normal in size without focal abnormality.

Adrenals/Urinary Tract: Adrenal glands are normal. No
hydronephrosis. Punctate calcifications anterior to the right psoas
are chronic and appears separate from the ureter. The bladder is
unremarkable.

Stomach/Bowel: Stomach is within normal limits. Appendix appears
normal. No evidence of bowel wall thickening, distention, or
inflammatory changes. Diverticular disease of the left colon.

Vascular/Lymphatic: Mild aortic atherosclerosis. No aneurysm. No
suspicious lymph nodes.

Reproductive: Status post hysterectomy. No adnexal masses.

Other: Negative for free air or effusion.

Musculoskeletal: Grade 1 anterolisthesis L4 on L5 and L5 on S1 with
degenerative changes. No acute osseous abnormality
IMPRESSION: 1. No CT evidence for acute intra-abdominal or pelvic abnormality.
No hydronephrosis or obstructive uropathy
2. Diverticular disease of the left colon without acute wall
thickening

## 2023-05-23 ENCOUNTER — Encounter (HOSPITAL_COMMUNITY): Payer: Self-pay

## 2023-05-23 ENCOUNTER — Emergency Department (HOSPITAL_COMMUNITY): Payer: Medicare PPO

## 2023-05-23 ENCOUNTER — Emergency Department (HOSPITAL_COMMUNITY)
Admission: EM | Admit: 2023-05-23 | Discharge: 2023-05-24 | Disposition: A | Discharge status: Home / Self Care | Payer: Medicare PPO | Attending: Emergency Medicine | Admitting: Emergency Medicine

## 2023-05-23 ENCOUNTER — Other Ambulatory Visit: Payer: Self-pay

## 2023-05-23 DIAGNOSIS — I700 Atherosclerosis of aorta: Secondary | ICD-10-CM | POA: Diagnosis not present

## 2023-05-23 DIAGNOSIS — N179 Acute kidney failure, unspecified: Secondary | ICD-10-CM | POA: Diagnosis not present

## 2023-05-23 DIAGNOSIS — D649 Anemia, unspecified: Secondary | ICD-10-CM | POA: Insufficient documentation

## 2023-05-23 DIAGNOSIS — Z7982 Long term (current) use of aspirin: Secondary | ICD-10-CM | POA: Insufficient documentation

## 2023-05-23 DIAGNOSIS — I10 Essential (primary) hypertension: Secondary | ICD-10-CM | POA: Diagnosis not present

## 2023-05-23 DIAGNOSIS — R0789 Other chest pain: Secondary | ICD-10-CM | POA: Diagnosis not present

## 2023-05-23 DIAGNOSIS — K219 Gastro-esophageal reflux disease without esophagitis: Secondary | ICD-10-CM | POA: Insufficient documentation

## 2023-05-23 DIAGNOSIS — J40 Bronchitis, not specified as acute or chronic: Secondary | ICD-10-CM | POA: Diagnosis not present

## 2023-05-23 DIAGNOSIS — J45909 Unspecified asthma, uncomplicated: Secondary | ICD-10-CM | POA: Diagnosis not present

## 2023-05-23 DIAGNOSIS — Z79899 Other long term (current) drug therapy: Secondary | ICD-10-CM | POA: Insufficient documentation

## 2023-05-23 DIAGNOSIS — R079 Chest pain, unspecified: Secondary | ICD-10-CM | POA: Diagnosis not present

## 2023-05-23 LAB — BASIC METABOLIC PANEL
Anion gap: 10 (ref 5–15)
BUN: 24 mg/dL — ABNORMAL HIGH (ref 8–23)
CO2: 23 mmol/L (ref 22–32)
Calcium: 9.2 mg/dL (ref 8.9–10.3)
Chloride: 105 mmol/L (ref 98–111)
Creatinine, Ser: 1.48 mg/dL — ABNORMAL HIGH (ref 0.44–1.00)
GFR, Estimated: 35 mL/min — ABNORMAL LOW (ref >60)
Glucose, Bld: 86 mg/dL (ref 70–99)
Potassium: 3.8 mmol/L (ref 3.5–5.1)
Sodium: 138 mmol/L (ref 135–145)

## 2023-05-23 LAB — CBC
HCT: 35.2 % — ABNORMAL LOW (ref 36.0–46.0)
Hemoglobin: 11.3 g/dL — ABNORMAL LOW (ref 12.0–15.0)
MCH: 27.2 pg (ref 26.0–34.0)
MCHC: 32.1 g/dL (ref 30.0–36.0)
MCV: 84.6 fL (ref 80.0–100.0)
Platelets: 276 10*3/uL (ref 150–400)
RBC: 4.16 MIL/uL (ref 3.87–5.11)
RDW: 13.2 % (ref 11.5–15.5)
WBC: 7.8 10*3/uL (ref 4.0–10.5)
nRBC: 0 % (ref 0.0–0.2)

## 2023-05-23 LAB — TROPONIN I (HIGH SENSITIVITY): Troponin I (High Sensitivity): 10 ng/L (ref <18)

## 2023-05-23 NOTE — ED Triage Notes (Signed)
Pt came in via POV d/t heaviness felt in her Lt chest for the past 3 week, reports this happens at least 2 days out of each week. Endorses belching & feeling like its hard to breath when it happens. A/Ox4, rates her chest discomfort a 6/10 while in triage.

## 2023-05-24 LAB — TROPONIN I (HIGH SENSITIVITY): Troponin I (High Sensitivity): 12 ng/L (ref <18)

## 2023-05-24 MED ORDER — MAALOX MAX 400-400-40 MG/5ML PO SUSP: 15.0000 mL | Freq: Four times a day (QID) | ORAL | 0 refills | Status: DC | PRN

## 2023-05-24 MED ORDER — ALUM & MAG HYDROXIDE-SIMETH 200-200-20 MG/5ML PO SUSP
30.0000 mL | Freq: Once | ORAL | Status: AC
  Administered 2023-05-24: 30 mL via ORAL
  Filled 2023-05-24: qty 30

## 2023-05-24 MED ORDER — SODIUM CHLORIDE 0.9 % IV BOLUS: 1000.0000 mL | Freq: Once | INTRAVENOUS | Status: DC

## 2023-05-24 MED ORDER — FAMOTIDINE 20 MG PO TABS
20.0000 mg | ORAL_TABLET | Freq: Once | ORAL | Status: AC
  Administered 2023-05-24: 20 mg via ORAL
  Filled 2023-05-24: qty 1

## 2023-05-24 MED ORDER — LIDOCAINE VISCOUS HCL 2 % MT SOLN
15.0000 mL | Freq: Once | OROMUCOSAL | Status: AC
  Administered 2023-05-24: 15 mL via ORAL
  Filled 2023-05-24: qty 15

## 2023-05-24 MED ORDER — FAMOTIDINE 20 MG PO TABS: 20.0000 mg | ORAL_TABLET | Freq: Every day | ORAL | 0 refills | Status: DC

## 2023-05-24 NOTE — Discharge Instructions (Signed)
Your labs revealed evidence of an acute kidney injury.  Recommend oral rehydration over the next few days and a recheck of your kidney function with your PCP.  Your cardiac enzymes were normal and your EKG and chest x-ray were reassuring.  Your symptoms are consistent with likely gastric reflux.  Continue to take your Nexium outpatient, additional prescription for Pepcid has been provided.  He can also try over-the-counter Maalox for your symptoms.  Recommend you follow-up outpatient with your cardiologist.

## 2023-05-24 NOTE — ED Provider Notes (Signed)
EMERGENCY DEPARTMENT AT [facility] Provider Note   CSN: [id number] Arrival date & time: 05/23/23  1721     History {Add pertinent medical, surgical, social history, OB history to HPI:1} Chief Complaint  Patient presents with   Chest Pain   Difficulty Breathing   Belching    Jessica Booker is a 82 y.o. female.   Chest Pain      Home Medications Prior to Admission medications   Medication Sig Start Date End Date Taking? Authorizing Provider  albuterol (PROVENTIL) (2.5 MG/3ML) 0.083% nebulizer solution Take 3 mLs (2.5 mg total) by nebulization every 4 (four) hours as needed for wheezing or shortness of breath. 01/10/23 01/10/24  Arnette Felts, FNP  albuterol (VENTOLIN HFA) 108 (90 Base) MCG/ACT inhaler TAKE 2 PUFFS BY MOUTH EVERY 6 HOURS AS NEEDED FOR WHEEZE OR SHORTNESS OF BREATH 07/22/21   Arnette Felts, FNP  aspirin 81 MG tablet Take 81 mg by mouth daily.    [provider]  atorvastatin (LIPITOR) 10 MG tablet One tab po qd, except Sunday 12/14/22   Dorothyann Peng, MD  cetirizine (ZYRTEC) 5 MG tablet TAKE 1 TABLET (5 MG TOTAL) BY MOUTH DAILY. Patient not taking: Reported on 04/19/2023 08/19/22   Dorothyann Peng, MD  Cholecalciferol (D3 2000 PO) Take by mouth.    [provider]  diclofenac sodium (VOLTAREN) 1 % GEL Apply 2 g to 4 g to affected joint up to 4 times daily PRN. 10/30/18   Gearldine Bienenstock, PA-C  doxycycline (VIBRAMYCIN) 100 MG capsule Take 1 capsule (100 mg total) by mouth 2 (two) times daily. Patient not taking: Reported on 03/30/2023 01/08/23   Army Melia A, PA-C  HYDROcodone bit-homatropine (HYDROMET) 5-1.5 MG/5ML syrup Take 5 mLs by mouth every 6 (six) hours as needed for cough. Patient not taking: Reported on 03/30/2023 01/10/23   Arnette Felts, FNP  hydroxychloroquine (PLAQUENIL) 200 MG tablet TAKE 1 TABLET BY MOUTH TWICE DAILY MONDAY THROUGH FRIDAY ONLY. 04/25/23   Gearldine Bienenstock, PA-C  lidocaine (LIDODERM) 5 % Place 1 patch  onto the skin daily. Remove & Discard patch within 12 hours or as directed by MD 12/24/21   Pollyann Savoy, MD  linaclotide [facility]) 72 MCG capsule Take 1 capsule (72 mcg total) by mouth as needed. 05/17/22   Dorothyann Peng, MD  NIFEdipine (ADALAT CC) 90 MG 24 hr tablet TAKE 1 TABLET BY MOUTH EVERY DAY 01/05/23   Yates Decamp, MD  valsartan-hydrochlorothiazide (DIOVAN-HCT) 320-12.5 MG tablet TAKE 1 TABLET BY MOUTH EVERY DAY IN THE MORNING 02/01/23   Yates Decamp, MD      Allergies    Patient has no known allergies.    Review of Systems   Review of Systems  Cardiovascular:  Positive for chest pain.    Physical Exam Updated Vital Signs BP (!) 174/56   Pulse 67   Temp 97.8 F (36.6 C)   Resp 16   Ht 5' 4.5" (1.638 m)   Wt 81.6 kg   SpO2 98%   BMI 30.42 kg/m  Physical Exam  ED Results / Procedures / Treatments   Labs (all labs ordered are listed, but only abnormal results are displayed) Labs Reviewed  BASIC METABOLIC PANEL - Abnormal; Notable for the following components:      Result Value   BUN 24 (*)    Creatinine, Ser 1.48 (*)    GFR, Estimated 35 (*)    All other components within normal limits  CBC - Abnormal;  Notable for the following components:   Hemoglobin 11.3 (*)    HCT 35.2 (*)    All other components within normal limits  TROPONIN I (HIGH SENSITIVITY)  TROPONIN I (HIGH SENSITIVITY)    EKG EKG Interpretation Date/Time:  Monday May 23 2023 18:33:54 EDT Ventricular Rate:  61 PR Interval:  186 QRS Duration:  88 QT Interval:  450 QTC Calculation: 453 R Axis:   74  Text Interpretation: Normal sinus rhythm No significant change since last tracing Confirmed by Ernie Avena ([id number]) on 05/24/2023 3:43:04 AM  Radiology DG Chest 2 View  Result Date: 05/23/2023 CLINICAL DATA:  Chest pain EXAM: CHEST - 2 VIEW COMPARISON:  01/08/2023 FINDINGS: Bronchitic changes. No focal airspace disease, pleural effusion or pneumothorax. Cardiomediastinal silhouette within  normal limits. No pneumothorax. Aortic atherosclerosis IMPRESSION: Bronchitic changes. Electronically Signed   By: Jasmine Pang M.D.   On: 05/23/2023 21:43    Procedures Procedures  {Document cardiac monitor, telemetry assessment procedure when appropriate:1}  Medications Ordered in ED Medications - No data to display  ED Course/ Medical Decision Making/ A&P   {   Click here for ABCD2, HEART and other calculatorsREFRESH Note before signing :1}                              Medical Decision Making Amount and/or Complexity of Data Reviewed Labs: ordered. Radiology: ordered.   ***  {Document critical care time when appropriate:1} {Document review of labs and clinical decision tools ie heart score, Chads2Vasc2 etc:1}  {Document your independent review of radiology images, and any outside records:1} {Document your discussion with family members, caretakers, and with consultants:1} {Document social determinants of health affecting pt's care:1} {Document your decision making why or why not admission, treatments were needed:1} Final Clinical Impression(s) / ED Diagnoses Final diagnoses:  None    Rx / DC Orders ED Discharge Orders     None

## 2023-05-25 ENCOUNTER — Telehealth: Payer: Self-pay | Admitting: Pharmacist

## 2023-05-25 NOTE — Progress Notes (Signed)
05/25/2023  Patient ID: Jessica Booker, female   DOB: September 03, 1940, 82 y.o.   MRN: [id number]  For ED visit on 9/23: Belching every 2 days for 3 weeks- given Famotidine and Maalox. Reports doing much better since then and has not needed the Maalox yet.  Patient appearing on report for [facility] - Hypertension Control report due to last documented ambulatory blood pressure of 159/62 on 04/19/23. Next appointment with PCP is 08/08/23   Outreached patient to discuss hypertension control and medication management.   Current antihypertensives: Nifedipine 90mg , Diovan-HCT 320-12.5mg   Patient has an automated upper arm home BP machine.  Current blood pressure readings: 135/60; however when she's in pain, BP reading go up. Reports readings were higher at Rheumatology due to knee pain   Patient denies hypotensive signs and symptoms including dizziness, lightheadedness.  Patient denies hypertensive symptoms including headache, chest pain, shortness of breath.  Patient denies side effects related to medications.   Advised to bring BP cuff to next office visit to verify readings and accuracy. Also plans to call office and change date of next visit since meeting with Cardiology the same morning.   Assessment/Plan: - Currently controlled - - Reviewed goal blood pressure <140/90 - Reviewed appropriate administration of medication regimen  Length of call: 13 minutes   Marlowe Aschoff, PharmD [facility] Phone Number: [phone]

## 2023-05-30 NOTE — Progress Notes (Unsigned)
Cardiology Office Note:  .   Date:  05/30/2023  ID:  Ebony Hail, DOB [birth year], MRN [id number] PCP: Dorothyann Peng, MD  [facility] Providers Cardiologist:  None { Click to update primary MD,subspecialty MD or APP then REFRESH:1}   History of Present Illness: Jessica Booker is a 82 y.o. 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 Seen in the emergency room on 05/23/2023 for chest pain, no changes in the EKG and cardiac markers were negative and she was discharged home with recommendation to follow-up with outpatient basis and follow-up of symptoms probably related to GERD and also acute renal insufficiency felt to be due to dehydration.  She now presents for follow-up.  ***  ROS  Risk Assessment/Calculations:     No BP recorded.  {Refresh Note OR Click here to enter BP  :1}***        Lab Results  Component Value Date   CHOL 127 09/08/2022   HDL 66 09/08/2022   LDLCALC 51 09/08/2022   TRIG 39 09/08/2022   CHOLHDL 1.9 09/08/2022   Lab Results  Component Value Date   NA 138 05/23/2023   K 3.8 05/23/2023   CO2 23 05/23/2023   GLUCOSE 86 05/23/2023   BUN 24 (H) 05/23/2023   CREATININE 1.48 (H) 05/23/2023   CALCIUM 9.2 05/23/2023   GFR 85.11 05/29/2019   EGFR 62 04/19/2023   GFRNONAA 35 (L) 05/23/2023   Lab Results  Component Value Date   WBC 7.8 05/23/2023   HGB 11.3 (L) 05/23/2023   HCT 35.2 (L) 05/23/2023   MCV 84.6 05/23/2023   PLT 276 05/23/2023    Physical Exam:   VS:  There were no vitals taken for this visit.   Wt Readings from Last 3 Encounters:  05/23/23 180 lb (81.6 kg)  04/19/23 181 lb (82.1 kg)  03/30/23 177 lb (80.3 kg)     Physical Exam   Studies Reviewed: Marland Kitchen        Exercise myoview stress 12/02/2017: 1. The patient performed  treadmill exercise using a Bruce protocol, completing 5 minutes. The patient completed an estimated workload of 7.03 METS, reaching 97% of the maximum predicted heart rate. Resting hypertension with exaggerated exercise response. Peak BP 248/48 mmHg. No stress symptoms reported. Fair exercise capacity.   No ischemic changes seen on stress electrocardiogram. Frequent PVC's seen at rest, and during exercise. 2. The overall quality of the study is excellent. There is no evidence of abnormal lung activity. Stress and rest SPECT images demonstrate homogeneous tracer distribution throughout the myocardium. Gated SPECT imaging reveals normal myocardial thickening and wall motion. The left ventricular ejection fraction was normal (72%).   3. Low risk study.  PCV ECHOCARDIOGRAM COMPLETE 06/05/2020  Narrative Echocardiogram 06/05/2020: Normal LV systolic function with visual EF 60-65%. Left ventricle cavity is normal in size. Mild left ventricular hypertrophy. Normal global wall motion. Indeterminate diastolic filling pattern, elevated LAP. Mild to moderate aortic regurgitation. Aortic valve sclerosis without stenosis. Mild (Grade I) mitral regurgitation. Mild tricuspid regurgitation. Mild pulmonic valve stenosis. Compared to prior study dated 05/09/2019 no significant change.  ABI 08/11/2021: Normal bilateral ABI, right 1.10 with triphasic waveforms and left 1.06 with triphasic waveform.  Carotid artery duplex 08/13/2021: Duplex suggests stenosis in the right internal carotid artery (minimal). Duplex suggests stenosis in the right  external carotid artery (>50%). Duplex suggests stenosis in the left internal carotid artery (minimal). Antegrade right vertebral artery flow. Antegrade left vertebral artery flow. No significant change from 12/14/2017. Follow up is appropriate if clinically indicated.   EKG:  EKG 08/05/2022: Normal sinus rhythm at rate of 73 bpm, left atrial enlargement, left axis deviation,  left anterior fascicular block.  Poor R progression, cannot exclude anteroseptal inf  ASSESSMENT AND PLAN: .      ICD-10-CM   1. Chest pain due to GERD  K21.9    R07.9     2. Moderate aortic regurgitation  I35.1     3. Primary hypertension  I10     4. Pure hypercholesterolemia  E78.00     5. Bruit of left carotid artery  R09.89       There are no diagnoses linked to this encounter.      {Are you ordering a CV Procedure (e.g. stress test, cath, DCCV, TEE, etc)?   Press F2        :[id number]}  Dispo: ***  Signed,  Yates Decamp, MD, [facility] 05/30/2023, 10:06 PM

## 2023-05-31 ENCOUNTER — Ambulatory Visit: Payer: Medicare PPO | Attending: Cardiology | Admitting: Cardiology

## 2023-05-31 ENCOUNTER — Encounter: Payer: Self-pay | Admitting: Cardiology

## 2023-05-31 VITALS — BP 122/60 | HR 68 | Resp 16 | Ht 64.0 in | Wt 176.0 lb

## 2023-05-31 DIAGNOSIS — R079 Chest pain, unspecified: Secondary | ICD-10-CM | POA: Diagnosis not present

## 2023-05-31 DIAGNOSIS — R0989 Other specified symptoms and signs involving the circulatory and respiratory systems: Secondary | ICD-10-CM

## 2023-05-31 DIAGNOSIS — K219 Gastro-esophageal reflux disease without esophagitis: Secondary | ICD-10-CM

## 2023-05-31 DIAGNOSIS — E7800 Pure hypercholesterolemia, unspecified: Secondary | ICD-10-CM

## 2023-05-31 DIAGNOSIS — I10 Essential (primary) hypertension: Secondary | ICD-10-CM | POA: Diagnosis not present

## 2023-05-31 DIAGNOSIS — I351 Nonrheumatic aortic (valve) insufficiency: Secondary | ICD-10-CM

## 2023-05-31 MED ORDER — OMEPRAZOLE 40 MG PO CPDR: 40.0000 mg | DELAYED_RELEASE_CAPSULE | Freq: Every day | ORAL | 0 refills | Status: DC

## 2023-05-31 NOTE — Patient Instructions (Signed)
Medication Instructions:  Your physician has recommended you make the following change in your medication: Start Omeprazole 40 mg by mouth daily   *If you need a refill on your cardiac medications before your next appointment, please call your pharmacy*   Lab Work: Lab work to be done today--BMP If you have labs (blood work) drawn today and your tests are completely normal, you will receive your results only by: MyChart Message (if you have MyChart) OR A paper copy in the mail If you have any lab test that is abnormal or we need to change your treatment, we will call you to review the results.   Testing/Procedures: Your physician has requested that you have an echocardiogram. Echocardiography is a painless test that uses sound waves to create images of your heart. It provides your doctor with information about the size and shape of your heart and how well your heart's chambers and valves are working. This procedure takes approximately one hour. There are no restrictions for this procedure. Please do NOT wear cologne, perfume, aftershave, or lotions (deodorant is allowed). Please arrive 15 minutes prior to your appointment time.    Follow-Up: At [facility], you and your health needs are our priority.  As part of our continuing mission to provide you with exceptional heart care, we have created designated Provider Care Teams.  These Care Teams include your primary Cardiologist (physician) and Advanced Practice Providers (APPs -  Physician Assistants and Nurse Practitioners) who all work together to provide you with the care you need, when you need it.  We recommend signing up for the patient portal called "MyChart".  Sign up information is provided on this After Visit Summary.  MyChart is used to connect with patients for Virtual Visits (Telemedicine).  Patients are able to view lab/test results, encounter notes, upcoming appointments, etc.  Non-urgent messages can be sent to your  provider as well.   To learn more about what you can do with MyChart, go to [email or web address].    Your next appointment:   12 month(s)  Provider:   Yates Decamp, MD     Other Instructions You have been referred to Dr Loreta Ave

## 2023-06-01 LAB — BASIC METABOLIC PANEL
BUN/Creatinine Ratio: 17 (ref 12–28)
BUN: 16 mg/dL (ref 8–27)
CO2: 22 mmol/L (ref 20–29)
Calcium: 9.9 mg/dL (ref 8.7–10.3)
Chloride: 101 mmol/L (ref 96–106)
Creatinine, Ser: 0.94 mg/dL (ref 0.57–1.00)
Glucose: 84 mg/dL (ref 70–99)
Potassium: 4.5 mmol/L (ref 3.5–5.2)
Sodium: 137 mmol/L (ref 134–144)
eGFR: 61 mL/min/{1.73_m2} (ref >59)

## 2023-06-01 NOTE — Progress Notes (Signed)
Her renal function is normal and all good, she was found to have elevated S. Cr in the ED probably due to dehydration for GERD

## 2023-06-14 DIAGNOSIS — K219 Gastro-esophageal reflux disease without esophagitis: Secondary | ICD-10-CM | POA: Diagnosis not present

## 2023-06-14 DIAGNOSIS — K5900 Constipation, unspecified: Secondary | ICD-10-CM | POA: Diagnosis not present

## 2023-06-14 DIAGNOSIS — E669 Obesity, unspecified: Secondary | ICD-10-CM | POA: Diagnosis not present

## 2023-06-14 DIAGNOSIS — R142 Eructation: Secondary | ICD-10-CM | POA: Diagnosis not present

## 2023-06-22 ENCOUNTER — Other Ambulatory Visit: Payer: Self-pay | Admitting: Cardiology

## 2023-06-22 DIAGNOSIS — K219 Gastro-esophageal reflux disease without esophagitis: Secondary | ICD-10-CM

## 2023-06-29 ENCOUNTER — Telehealth: Payer: Self-pay

## 2023-06-29 NOTE — Telephone Encounter (Signed)
Transition Care Management Unsuccessful Follow-up Telephone Call  Date of discharge and from where:  [facility]  Attempts:  2nd Attempt  Reason for unsuccessful TCM follow-up call:  No answer/busy   Lenard Forth [place]  [facility], [facility] Guide, Phone: [phone] Website: [email or web address]

## 2023-06-29 NOTE — Telephone Encounter (Signed)
Transition Care Management Unsuccessful Follow-up Telephone Call  Date of discharge and from where:  Jessica Booker 9/24  Attempts:  1st Attempt  Reason for unsuccessful TCM follow-up call:  No answer/busy   Jessica Booker [place]  [facility], [facility] Guide, Phone: [phone] Website: [email or web address]

## 2023-07-07 ENCOUNTER — Other Ambulatory Visit: Payer: Self-pay | Admitting: Cardiology

## 2023-07-07 DIAGNOSIS — I351 Nonrheumatic aortic (valve) insufficiency: Secondary | ICD-10-CM

## 2023-07-07 DIAGNOSIS — I340 Nonrheumatic mitral (valve) insufficiency: Secondary | ICD-10-CM

## 2023-07-07 DIAGNOSIS — I10 Essential (primary) hypertension: Secondary | ICD-10-CM

## 2023-07-08 ENCOUNTER — Other Ambulatory Visit: Payer: Self-pay

## 2023-07-08 DIAGNOSIS — K219 Gastro-esophageal reflux disease without esophagitis: Secondary | ICD-10-CM

## 2023-07-20 ENCOUNTER — Other Ambulatory Visit: Payer: Self-pay | Admitting: Physician Assistant

## 2023-07-20 DIAGNOSIS — M3219 Other organ or system involvement in systemic lupus erythematosus: Secondary | ICD-10-CM

## 2023-07-20 NOTE — Telephone Encounter (Signed)
Last Fill: 04/25/2023  Eye exam: 03/31/2023 WNL   Labs: 04/19/2023 , CMP normal, CBC shows low hemoglobin at 10.9.   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

## 2023-07-27 ENCOUNTER — Ambulatory Visit (HOSPITAL_COMMUNITY): Payer: Medicare PPO | Attending: Cardiology

## 2023-07-27 ENCOUNTER — Other Ambulatory Visit: Payer: Medicare PPO

## 2023-07-27 DIAGNOSIS — I351 Nonrheumatic aortic (valve) insufficiency: Secondary | ICD-10-CM | POA: Diagnosis not present

## 2023-07-27 LAB — ECHOCARDIOGRAM COMPLETE
AR max vel: 1.73 cm2
AV Area VTI: 1.82 cm2
AV Area mean vel: 1.74 cm2
AV Mean grad: 13.6 mm[Hg]
AV Peak grad: 24.3 mm[Hg]
Ao pk vel: 2.47 m/s
Area-P 1/2: 3.62 cm2
P 1/2 time: 396 ms
S' Lateral: 2.2 cm

## 2023-07-27 NOTE — Progress Notes (Signed)
Stable aortic regurgitation. NOrmal heart function. No change in the study from Echocardiogram 06/05/2020  Aortic valve is between the main chamber of the heart and the aorta. Its job is to prevent blood leaking back into the heart when the heart has finished pumping the blood out and is relaxing.  ar= Aortic regurgitation. This valve is leaking blood back to to the left heart. It is mostly not very improtant unless it is moderate or severe. Most AR (aortic regurgitation) remains very stable for years. Valve needs to be replaced only for severe AR.

## 2023-08-08 ENCOUNTER — Ambulatory Visit: Payer: Medicare PPO | Admitting: Internal Medicine

## 2023-08-08 ENCOUNTER — Ambulatory Visit: Payer: Self-pay | Admitting: Cardiology

## 2023-08-22 ENCOUNTER — Ambulatory Visit: Payer: Medicare PPO | Admitting: Internal Medicine

## 2023-08-22 ENCOUNTER — Encounter: Payer: Self-pay | Admitting: Internal Medicine

## 2023-08-22 VITALS — BP 120/70 | HR 76 | Temp 98.1°F | Ht 64.0 in | Wt 170.8 lb

## 2023-08-22 DIAGNOSIS — E663 Overweight: Secondary | ICD-10-CM | POA: Diagnosis not present

## 2023-08-22 DIAGNOSIS — I119 Hypertensive heart disease without heart failure: Secondary | ICD-10-CM | POA: Diagnosis not present

## 2023-08-22 DIAGNOSIS — D649 Anemia, unspecified: Secondary | ICD-10-CM

## 2023-08-22 DIAGNOSIS — Z6829 Body mass index (BMI) 29.0-29.9, adult: Secondary | ICD-10-CM | POA: Diagnosis not present

## 2023-08-22 DIAGNOSIS — E7800 Pure hypercholesterolemia, unspecified: Secondary | ICD-10-CM

## 2023-08-22 DIAGNOSIS — I700 Atherosclerosis of aorta: Secondary | ICD-10-CM | POA: Diagnosis not present

## 2023-08-22 NOTE — Progress Notes (Signed)
Madelaine Bhat, CMA,acting as a scribe for Gwynneth Aliment, MD.,have documented all relevant documentation on the behalf of Gwynneth Aliment, MD,as directed by  Gwynneth Aliment, MD while in the presence of Gwynneth Aliment, MD.  Subjective:  Patient ID: Jessica Booker , female    DOB: 01/06/1941 , 82 y.o.   MRN: [id number]  Chief Complaint  Patient presents with   Hypertension    HPI  Patient presents today for a bp and CHOL follow up, Patient reports compliance with medication. Patient denies any chest pain, SOB, or headaches. Patient has no concerns today.   Hypertension This is a chronic problem. The current episode started more than 1 year ago. The problem has been gradually improving since onset. The problem is controlled. Associated symptoms include anxiety. Pertinent negatives include no headaches, palpitations or peripheral edema. Risk factors for coronary artery disease include sedentary lifestyle. Past treatments include diuretics. There are no compliance problems.  There is no history of angina. There is no history of chronic renal disease.     Past Medical History:  Diagnosis Date   Asthma    Hypertension    Lupus      Family History  Problem Relation Age of Onset   Heart attack Mother    Stomach cancer Father    Hypertension Sister    Heart disease Sister    Heart disease Sister      Current Outpatient Medications:    albuterol (PROVENTIL) (2.5 MG/3ML) 0.083% nebulizer solution, Take 3 mLs (2.5 mg total) by nebulization every 4 (four) hours as needed for wheezing or shortness of breath., Disp: 75 mL, Rfl: 2   albuterol (VENTOLIN HFA) 108 (90 Base) MCG/ACT inhaler, TAKE 2 PUFFS BY MOUTH EVERY 6 HOURS AS NEEDED FOR WHEEZE OR SHORTNESS OF BREATH, Disp: 18 each, Rfl: 1   alum & mag hydroxide-simeth (MAALOX MAX) 400-400-40 MG/5ML suspension, Take 15 mLs by mouth every 6 (six) hours as needed for indigestion., Disp: 355 mL, Rfl: 0   aspirin 81 MG tablet, Take 81 mg by  mouth daily., Disp: , Rfl:    atorvastatin (LIPITOR) 10 MG tablet, One tab po qd, except Sunday, Disp: 72 tablet, Rfl: 2   Cholecalciferol (D3 2000 PO), Take by mouth., Disp: , Rfl:    diclofenac sodium (VOLTAREN) 1 % GEL, Apply 2 g to 4 g to affected joint up to 4 times daily PRN., Disp: 4 Tube, Rfl: 2   doxycycline (VIBRAMYCIN) 100 MG capsule, Take 1 capsule (100 mg total) by mouth 2 (two) times daily., Disp: 20 capsule, Rfl: 0   hydroxychloroquine (PLAQUENIL) 200 MG tablet, TAKE 1 TABLET BY MOUTH TWICE DAILY MONDAY THROUGH FRIDAY ONLY., Disp: 120 tablet, Rfl: 0   lidocaine (LIDODERM) 5 %, Place 1 patch onto the skin daily. Remove & Discard patch within 12 hours or as directed by MD, Disp: 30 patch, Rfl: 0   linaclotide (LINZESS) 72 MCG capsule, Take 1 capsule (72 mcg total) by mouth as needed., Disp: 30 capsule, Rfl: 1   NIFEdipine (ADALAT CC) 90 MG 24 hr tablet, TAKE 1 TABLET BY MOUTH EVERY DAY, Disp: 90 tablet, Rfl: 3   omeprazole (PRILOSEC) 40 MG capsule, TAKE 1 CAPSULE BY MOUTH EVERY DAY BEFORE BREAKFAST, Disp: 30 capsule, Rfl: 0   valsartan-hydrochlorothiazide (DIOVAN-HCT) 320-12.5 MG tablet, TAKE 1 TABLET BY MOUTH EVERY DAY IN THE MORNING, Disp: 90 tablet, Rfl: 3   HYDROcodone bit-homatropine (HYDROMET) 5-1.5 MG/5ML syrup, Take 5 mLs by mouth every 6 (  six) hours as needed for cough. (Patient not taking: Reported on 08/22/2023), Disp: 120 mL, Rfl: 0   No Known Allergies   Review of Systems  Constitutional: Negative.   Respiratory: Negative.    Cardiovascular: Negative.  Negative for palpitations.  Gastrointestinal: Negative.   Neurological: Negative.  Negative for headaches.  Psychiatric/Behavioral: Negative.       Today's Vitals   08/22/23 0951  BP: 120/70  Pulse: 76  Temp: 98.1 F (36.7 C)  TempSrc: Oral  Weight: 170 lb 12.8 oz (77.5 kg)  Height: 5\' 4"  (1.626 m)  PainSc: 0-No pain   Body mass index is 29.32 kg/m.  Wt Readings from Last 3 Encounters:  08/22/23 170 lb  12.8 oz (77.5 kg)  05/31/23 176 lb (79.8 kg)  05/23/23 180 lb (81.6 kg)    The ASCVD Risk score (Arnett DK, et al., 2019) failed to calculate for the following reasons:   The 2019 ASCVD risk score is only valid for ages 70 to 66  Objective:  Physical Exam Vitals and nursing note reviewed.  Constitutional:      Appearance: Normal appearance.  HENT:     Head: Normocephalic and atraumatic.  Eyes:     Extraocular Movements: Extraocular movements intact.     Comments: Pale conjunctiva  Cardiovascular:     Rate and Rhythm: Normal rate and regular rhythm.     Heart sounds: Normal heart sounds.  Pulmonary:     Effort: Pulmonary effort is normal.     Breath sounds: Normal breath sounds.  Musculoskeletal:     Cervical back: Normal range of motion.  Skin:    General: Skin is warm.  Neurological:     General: No focal deficit present.     Mental Status: She is alert.  Psychiatric:        Mood and Affect: Mood normal.        Behavior: Behavior normal.         Assessment And Plan:  Hypertensive heart disease without heart failure Assessment & Plan: Chronic, well controlled. Goal BP<120/80.  She will continue with nifedipine 90mg  daily and valsartan/hct 320/12.5mg  daily. She is reminded to follow a low sodium diet.    Atherosclerosis of aorta (HCC) Assessment & Plan: Chronic, LDL goal is less than 70.  She will continue with atorvastatin. Unfortunately, she doesn't tolerate daily dosing due to leg cramps.    Anemia, unspecified type -     CBC -     Iron, TIBC and Ferritin Panel -     Vitamin B12  Overweight with body mass index (BMI) of 29 to 29.9 in adult Assessment & Plan: BMI is acceptable for her demographic. She is encouraged to aim for at least 150 minutes of exercise per week.      Return if symptoms worsen or fail to improve.  Patient was given opportunity to ask questions. Patient verbalized understanding of the plan and was able to repeat key elements of the  plan. All questions were answered to their satisfaction.    I, Gwynneth Aliment, MD, have reviewed all documentation for this visit. The documentation on 08/22/23 for the exam, diagnosis, procedures, and orders are all accurate and complete.   IF YOU HAVE BEEN REFERRED TO A SPECIALIST, IT MAY TAKE 1-2 WEEKS TO SCHEDULE/PROCESS THE REFERRAL. IF YOU HAVE NOT HEARD FROM US/SPECIALIST IN TWO WEEKS, PLEASE GIVE [place] A CALL AT [phone].

## 2023-08-23 LAB — IRON,TIBC AND FERRITIN PANEL
Ferritin: 109 ng/mL (ref 15–150)
Iron Saturation: 21 % (ref 15–55)
Iron: 69 ug/dL (ref 27–139)
Total Iron Binding Capacity: 321 ug/dL (ref 250–450)
UIBC: 252 ug/dL (ref 118–369)

## 2023-08-23 LAB — CBC
Hematocrit: 33.3 % — ABNORMAL LOW (ref 34.0–46.6)
Hemoglobin: 10.8 g/dL — ABNORMAL LOW (ref 11.1–15.9)
MCH: 26.7 pg (ref 26.6–33.0)
MCHC: 32.4 g/dL (ref 31.5–35.7)
MCV: 82 fL (ref 79–97)
Platelets: 289 10*3/uL (ref 150–450)
RBC: 4.05 x10E6/uL (ref 3.77–5.28)
RDW: 12.6 % (ref 11.7–15.4)
WBC: 7.4 10*3/uL (ref 3.4–10.8)

## 2023-08-23 LAB — VITAMIN B12: Vitamin B-12: 448 pg/mL (ref 232–1245)

## 2023-08-28 DIAGNOSIS — E663 Overweight: Secondary | ICD-10-CM | POA: Insufficient documentation

## 2023-08-28 DIAGNOSIS — D649 Anemia, unspecified: Secondary | ICD-10-CM | POA: Insufficient documentation

## 2023-08-28 NOTE — Assessment & Plan Note (Signed)
Chronic, well controlled. Goal BP<120/80.  She will continue with nifedipine 90mg  daily and valsartan/hct 320/12.5mg  daily. She is reminded to follow a low sodium diet.

## 2023-08-28 NOTE — Assessment & Plan Note (Signed)
BMI is acceptable for her demographic. She is encouraged to aim for at least 150 minutes of exercise per week.

## 2023-08-28 NOTE — Assessment & Plan Note (Signed)
Chronic, LDL goal is less than 70.  She will continue with atorvastatin. Unfortunately, she doesn't tolerate daily dosing due to leg cramps.

## 2023-08-28 NOTE — Assessment & Plan Note (Signed)
Chronic, LDL goal < 70.  Chronic, has debilitating leg cramps with atorvastatin daily, except Sundays. Pt advised to take M-F, skipping Sat/Sun.

## 2023-09-05 NOTE — Progress Notes (Deleted)
 Office Visit Note  Patient: Jessica Booker             Date of Birth: 1940/09/29           MRN: [id number]             PCP: Dorothyann Peng, MD Referring: Dorothyann Peng, MD Visit Date: 09/19/2023 Occupation: @GUAROCC @  Subjective:  No chief complaint on file.   History of Present Illness: Jessica Booker is a 83 y.o. female ***     Activities of Daily Living:  Patient reports morning stiffness for *** {minute/hour:19697}.   Patient {ACTIONS;DENIES/REPORTS:21021675::"Denies"} nocturnal pain.  Difficulty dressing/grooming: {ACTIONS;DENIES/REPORTS:21021675::"Denies"} Difficulty climbing stairs: {ACTIONS;DENIES/REPORTS:21021675::"Denies"} Difficulty getting out of chair: {ACTIONS;DENIES/REPORTS:21021675::"Denies"} Difficulty using hands for taps, buttons, cutlery, and/or writing: {ACTIONS;DENIES/REPORTS:21021675::"Denies"}  No Rheumatology ROS completed.   PMFS History:  Patient Active Problem List   Diagnosis Date Noted   Anemia 08/28/2023   Overweight with body mass index (BMI) of 29 to 29.9 in adult 08/28/2023   Sleep related leg cramps 04/03/2023   Atherosclerosis of aorta (HCC) 04/03/2023   Hypertensive heart disease without heart failure 04/03/2023   Pure hypercholesterolemia 04/03/2023   Class 1 obesity due to excess calories with serious comorbidity and body mass index (BMI) of 30.0 to 30.9 in adult 04/03/2023   Wheezing 01/10/2023   Acute cough 01/10/2023   Pneumonia of right middle lobe due to infectious organism 01/10/2023   Moderate persistent asthma with acute exacerbation 01/10/2023   Primary insomnia 08/31/2022   Dyspnea 04/11/2019   Belching 04/11/2019   Constipation 04/11/2019   Essential hypertension 11/02/2018   Lupus 11/02/2018   Primary osteoarthritis of both knees 06/06/2018   Cough variant asthma 08/25/2017   Systemic lupus erythematosus (HCC) 02/18/2017   High risk medication use 02/18/2017   Raynaud's disease without gangrene 02/18/2017    DDD (degenerative disc disease), lumbar 02/18/2017   Primary osteoarthritis of both hands 02/18/2017   Primary osteoarthritis of both feet 02/18/2017   Vitamin D deficiency 02/18/2017   History of hypertension 02/18/2017   History of neuropathy 02/18/2017    Past Medical History:  Diagnosis Date   Asthma    Hypertension    Lupus     Family History  Problem Relation Age of Onset   Heart attack Mother    Stomach cancer Father    Hypertension Sister    Heart disease Sister    Heart disease Sister    Past Surgical History:  Procedure Laterality Date   ABDOMINAL HYSTERECTOMY     CHOLECYSTECTOMY     SHOULDER ARTHROSCOPY W/ ROTATOR CUFF REPAIR     R shoulder   TONSILLECTOMY     Social History   Social History Narrative   Not on file   Immunization History  Administered Date(s) Administered   Fluad Quad(high Dose 65+) 05/27/2021   Influenza, High Dose Seasonal PF 06/21/2017, 05/29/2018, 05/16/2019   Influenza-Unspecified 05/30/2018, 05/16/2019, 05/13/2020, 06/14/2022   MODERNA COVID-19 SARS-COV-2 PEDS BIVALENT BOOSTER 73yr-47yr 05/31/2023   PFIZER Comirnaty(Gray Top)Covid-19 Tri-Sucrose Vaccine 12/25/2020   PFIZER(Purple Top)SARS-COV-2 Vaccination 10/04/2019, 10/30/2019, 05/23/2020   Pfizer Covid-19 Vaccine Bivalent Booster 68yrs & up 07/19/2021, 07/12/2022   Pneumococcal Conjugate-13 07/06/2019   Pneumococcal Polysaccharide-23 09/17/2013   Zoster Recombinant(Shingrix) 05/29/2018, 08/01/2018   Zoster, Live 10/17/2020     Objective: Vital Signs: There were no vitals taken for this visit.   Physical Exam   Musculoskeletal Exam: ***  CDAI Exam: CDAI Score: -- Patient Global: --; Provider Global: -- Swollen: --; Tender: --  Joint Exam 09/19/2023   No joint exam has been documented for this visit   There is currently no information documented on the homunculus. Go to the Rheumatology activity and complete the homunculus joint exam.  Investigation: No additional  findings.  Imaging: No results found.  Recent Labs: Lab Results  Component Value Date   WBC 7.4 08/22/2023   HGB 10.8 (L) 08/22/2023   PLT 289 08/22/2023   NA 137 05/31/2023   K 4.5 05/31/2023   CL 101 05/31/2023   CO2 22 05/31/2023   GLUCOSE 84 05/31/2023   BUN 16 05/31/2023   CREATININE 0.94 05/31/2023   BILITOT 0.4 04/19/2023   ALKPHOS 74 12/23/2021   AST 16 04/19/2023   ALT 13 04/19/2023   PROT 7.8 04/19/2023   ALBUMIN 4.2 12/23/2021   CALCIUM 9.9 05/31/2023   GFRAA 73 06/27/2020    Speciality Comments: PLQ Eye Exam: 03/31/2023 WNL @ [facility] Follow up in 1 year.   Procedures:  No procedures performed Allergies: Patient has no known allergies.   Assessment / Plan:     Visit Diagnoses: No diagnosis found.  Orders: No orders of the defined types were placed in this encounter.  No orders of the defined types were placed in this encounter.   Face-to-face time spent with patient was *** minutes. Greater than 50% of time was spent in counseling and coordination of care.  Follow-Up Instructions: No follow-ups on file.   Pollyann Savoy, MD  Note - This record has been created using Animal nutritionist.  Chart creation errors have been sought, but may not always  have been located. Such creation errors do not reflect on  the standard of medical care.

## 2023-09-19 ENCOUNTER — Ambulatory Visit: Payer: Medicare PPO | Admitting: Rheumatology

## 2023-09-19 DIAGNOSIS — I351 Nonrheumatic aortic (valve) insufficiency: Secondary | ICD-10-CM

## 2023-09-19 DIAGNOSIS — M3219 Other organ or system involvement in systemic lupus erythematosus: Secondary | ICD-10-CM

## 2023-09-19 DIAGNOSIS — M51369 Other intervertebral disc degeneration, lumbar region without mention of lumbar back pain or lower extremity pain: Secondary | ICD-10-CM

## 2023-09-19 DIAGNOSIS — M65342 Trigger finger, left ring finger: Secondary | ICD-10-CM

## 2023-09-19 DIAGNOSIS — M170 Bilateral primary osteoarthritis of knee: Secondary | ICD-10-CM

## 2023-09-19 DIAGNOSIS — Z8679 Personal history of other diseases of the circulatory system: Secondary | ICD-10-CM

## 2023-09-19 DIAGNOSIS — Z13820 Encounter for screening for osteoporosis: Secondary | ICD-10-CM

## 2023-09-19 DIAGNOSIS — M5030 Other cervical disc degeneration, unspecified cervical region: Secondary | ICD-10-CM

## 2023-09-19 DIAGNOSIS — R202 Paresthesia of skin: Secondary | ICD-10-CM

## 2023-09-19 DIAGNOSIS — E7800 Pure hypercholesterolemia, unspecified: Secondary | ICD-10-CM

## 2023-09-19 DIAGNOSIS — Z79899 Other long term (current) drug therapy: Secondary | ICD-10-CM

## 2023-09-19 DIAGNOSIS — G8929 Other chronic pain: Secondary | ICD-10-CM

## 2023-09-19 DIAGNOSIS — M65341 Trigger finger, right ring finger: Secondary | ICD-10-CM

## 2023-09-19 DIAGNOSIS — I7300 Raynaud's syndrome without gangrene: Secondary | ICD-10-CM

## 2023-09-19 DIAGNOSIS — M19071 Primary osteoarthritis, right ankle and foot: Secondary | ICD-10-CM

## 2023-09-19 DIAGNOSIS — M19041 Primary osteoarthritis, right hand: Secondary | ICD-10-CM

## 2023-09-19 DIAGNOSIS — Z8669 Personal history of other diseases of the nervous system and sense organs: Secondary | ICD-10-CM

## 2023-09-27 ENCOUNTER — Other Ambulatory Visit (INDEPENDENT_AMBULATORY_CARE_PROVIDER_SITE_OTHER): Payer: Self-pay

## 2023-09-27 DIAGNOSIS — D649 Anemia, unspecified: Secondary | ICD-10-CM | POA: Diagnosis not present

## 2023-09-27 LAB — HEMOCCULT GUIAC POC 1CARD (OFFICE)
Card #2 Fecal Occult Blod, POC: POSITIVE
Card #3 Fecal Occult Blood, POC: POSITIVE
Fecal Occult Blood, POC: POSITIVE — AB

## 2023-09-30 ENCOUNTER — Other Ambulatory Visit: Payer: Self-pay

## 2023-09-30 DIAGNOSIS — D509 Iron deficiency anemia, unspecified: Secondary | ICD-10-CM

## 2023-10-07 ENCOUNTER — Other Ambulatory Visit: Payer: Self-pay | Admitting: Internal Medicine

## 2023-10-07 DIAGNOSIS — Z1231 Encounter for screening mammogram for malignant neoplasm of breast: Secondary | ICD-10-CM

## 2023-10-16 ENCOUNTER — Other Ambulatory Visit: Payer: Self-pay | Admitting: Physician Assistant

## 2023-10-16 DIAGNOSIS — M3219 Other organ or system involvement in systemic lupus erythematosus: Secondary | ICD-10-CM

## 2023-10-17 NOTE — Progress Notes (Signed)
 Office Visit Note  Patient: Jessica Booker             Date of Birth: 30-Jan-1941           MRN: [id number]             PCP: Dorothyann Peng, MD Referring: Dorothyann Peng, MD Visit Date: 10/31/2023 Occupation: @GUAROCC @  Subjective:  Medication management  History of Present Illness: Jessica Booker is a 83 y.o. female with systemic lupus erythematosus and osteoarthritis.  She returns today after her last visit on April 19, 2023.  She has been on Plaquenil 200 mg 1 tablet p.o. twice daily Monday to Friday.  Patient states that the cortisone injection in her left shoulder joint was very helpful.  She states she has been doing some stretching exercises.  She notices some discomfort in her bilateral shoulder muscles but not in the joint.  Which she describes over the deltoid insertion.  She continues to have some stiffness in her bilateral hands.  She gives history of her nasal sore in her left nostril for which she started using Vaseline.  She denies any history of dry mouth, dry eyes, oral ulcers, malar rash, hair loss, Raynaud's or lymphadenopathy.  She denies any history of shortness of breath or palpitations.    Activities of Daily Living:  Patient reports morning stiffness for 0 minutes.   Patient Reports nocturnal pain.  Difficulty dressing/grooming: Denies Difficulty climbing stairs: Denies Difficulty getting out of chair: Denies Difficulty using hands for taps, buttons, cutlery, and/or writing: Denies  Review of Systems  Constitutional:  Negative for fatigue.  HENT:  Negative for mouth sores, mouth dryness and nose dryness.        Nose sores  Eyes:  Negative for pain and dryness.  Respiratory:  Negative for shortness of breath and difficulty breathing.   Cardiovascular:  Negative for chest pain and palpitations.  Gastrointestinal:  Negative for blood in stool, constipation and diarrhea.  Endocrine: Negative for increased urination.  Genitourinary:  Negative for  involuntary urination.  Musculoskeletal:  Positive for joint pain, joint pain, myalgias, morning stiffness and myalgias. Negative for gait problem, joint swelling, muscle weakness and muscle tenderness.  Skin:  Positive for sensitivity to sunlight. Negative for color change, rash and hair loss.  Allergic/Immunologic: Negative for susceptible to infections.  Neurological:  Negative for dizziness and headaches.  Hematological:  Negative for swollen glands.  Psychiatric/Behavioral:  Positive for sleep disturbance. Negative for depressed mood. The patient is not nervous/anxious.     PMFS History:  Patient Active Problem List   Diagnosis Date Noted   Anemia 08/28/2023   Overweight with body mass index (BMI) of 29 to 29.9 in adult 08/28/2023   Sleep related leg cramps 04/03/2023   Atherosclerosis of aorta (HCC) 04/03/2023   Hypertensive heart disease without heart failure 04/03/2023   Pure hypercholesterolemia 04/03/2023   Class 1 obesity due to excess calories with serious comorbidity and body mass index (BMI) of 30.0 to 30.9 in adult 04/03/2023   Wheezing 01/10/2023   Acute cough 01/10/2023   Pneumonia of right middle lobe due to infectious organism 01/10/2023   Moderate persistent asthma with acute exacerbation 01/10/2023   Primary insomnia 08/31/2022   Dyspnea 04/11/2019   Belching 04/11/2019   Constipation 04/11/2019   Essential hypertension 11/02/2018   Lupus 11/02/2018   Primary osteoarthritis of both knees 06/06/2018   Cough variant asthma 08/25/2017   Systemic lupus erythematosus (HCC) 02/18/2017   High risk medication use  02/18/2017   Raynaud's disease without gangrene 02/18/2017   DDD (degenerative disc disease), lumbar 02/18/2017   Primary osteoarthritis of both hands 02/18/2017   Primary osteoarthritis of both feet 02/18/2017   Vitamin D deficiency 02/18/2017   History of hypertension 02/18/2017   History of neuropathy 02/18/2017    Past Medical History:  Diagnosis  Date   Asthma    Hypertension    Lupus     Family History  Problem Relation Age of Onset   Heart attack Mother    Stomach cancer Father    Hypertension Sister    Heart disease Sister    Heart disease Sister    Past Surgical History:  Procedure Laterality Date   ABDOMINAL HYSTERECTOMY     CHOLECYSTECTOMY     SHOULDER ARTHROSCOPY W/ ROTATOR CUFF REPAIR     R shoulder   TONSILLECTOMY     Social History   Social History Narrative   Not on file   Immunization History  Administered Date(s) Administered   Fluad Quad(high Dose 65+) 05/27/2021   Influenza, High Dose Seasonal PF 06/21/2017, 05/29/2018, 05/16/2019   Influenza-Unspecified 05/30/2018, 05/16/2019, 05/13/2020, 06/14/2022   MODERNA COVID-19 SARS-COV-2 PEDS BIVALENT BOOSTER 65yr-12yr 05/31/2023   PFIZER Comirnaty(Gray Top)Covid-19 Tri-Sucrose Vaccine 12/25/2020   PFIZER(Purple Top)SARS-COV-2 Vaccination 10/04/2019, 10/30/2019, 05/23/2020   Pfizer Covid-19 Vaccine Bivalent Booster 58yrs & up 07/19/2021, 07/12/2022   Pneumococcal Conjugate-13 07/06/2019   Pneumococcal Polysaccharide-23 09/17/2013   RSV,unspecified 09/09/2023   Zoster Recombinant(Shingrix) 05/29/2018, 08/01/2018   Zoster, Live 10/17/2020     Objective: Vital Signs: BP (!) 150/64 (BP Location: Left Arm, Patient Position: Sitting, Cuff Size: Normal)   Pulse (!) 59   Resp 14   Ht 5\' 4"  (1.626 m)   Wt 178 lb (80.7 kg)   BMI 30.55 kg/m    Physical Exam Vitals and nursing note reviewed.  Constitutional:      Appearance: She is well-developed.  HENT:     Head: Normocephalic and atraumatic.  Eyes:     Conjunctiva/sclera: Conjunctivae normal.  Cardiovascular:     Rate and Rhythm: Normal rate and regular rhythm.     Heart sounds: Normal heart sounds.  Pulmonary:     Effort: Pulmonary effort is normal.     Breath sounds: Normal breath sounds.  Abdominal:     General: Bowel sounds are normal.     Palpations: Abdomen is soft.  Musculoskeletal:      Cervical back: Normal range of motion.  Lymphadenopathy:     Cervical: No cervical adenopathy.  Skin:    General: Skin is warm and dry.     Capillary Refill: Capillary refill takes less than 2 seconds.  Neurological:     Mental Status: She is alert and oriented to person, place, and time.  Psychiatric:        Behavior: Behavior normal.      Musculoskeletal Exam: She had limited lateral rotation of the cervical spine.  She had good range of motion of thoracic lumbar spine without any discomfort.  Shoulders, elbows, wrist joints, MCPs PIPs and DIPs Juengel range of motion.  She had bilateral PIP and DIP thickening with no synovitis.  Hip joints and knee joints in good range of motion.  No warmth swelling or effusion was noted.  There was no tenderness over ankles or MTPs.  CDAI Exam: CDAI Score: -- Patient Global: --; Provider Global: -- Swollen: --; Tender: -- Joint Exam 10/31/2023   No joint exam has been documented for this visit  There is currently no information documented on the homunculus. Go to the Rheumatology activity and complete the homunculus joint exam.  Investigation: No additional findings.  Imaging: MM 3D SCREENING MAMMOGRAM BILATERAL BREAST Result Date: 10/20/2023 CLINICAL DATA:  Screening. EXAM: DIGITAL SCREENING BILATERAL MAMMOGRAM WITH TOMOSYNTHESIS AND CAD TECHNIQUE: Bilateral screening digital craniocaudal and mediolateral oblique mammograms were obtained. Bilateral screening digital breast tomosynthesis was performed. The images were evaluated with computer-aided detection. COMPARISON:  Previous exam(s). ACR Breast Density Category b: There are scattered areas of fibroglandular density. FINDINGS: There are no findings suspicious for malignancy. IMPRESSION: No mammographic evidence of malignancy. A result letter of this screening mammogram will be mailed directly to the patient. RECOMMENDATION: Screening mammogram in one year. (Code:SM-B-01Y) BI-RADS CATEGORY  1:  Negative. Electronically Signed   By: Frederico Hamman M.D.   On: 10/20/2023 16:00    Recent Labs: Lab Results  Component Value Date   WBC 7.4 08/22/2023   HGB 10.8 (L) 08/22/2023   PLT 289 08/22/2023   NA 137 05/31/2023   K 4.5 05/31/2023   CL 101 05/31/2023   CO2 22 05/31/2023   GLUCOSE 84 05/31/2023   BUN 16 05/31/2023   CREATININE 0.94 05/31/2023   BILITOT 0.4 04/19/2023   ALKPHOS 74 12/23/2021   AST 16 04/19/2023   ALT 13 04/19/2023   PROT 7.8 04/19/2023   ALBUMIN 4.2 12/23/2021   CALCIUM 9.9 05/31/2023   GFRAA 73 06/27/2020   April 19, 2023 CBC normal except hemoglobin of 10.9, CMP normal, sed rate normal, complements normal, double-stranded DNA negative, urine protein creatinine ratio normal  Speciality Comments: PLQ Eye Exam: 03/31/2023 WNL @  [facility] Follow up in 1 year.   Procedures:  No procedures performed Allergies: Patient has no known allergies.   Assessment / Plan:     Visit Diagnoses: Other systemic lupus erythematosus with other organ involvement (HCC) - -ANA, positive Smith, and positive Ro and RNP: Patient can history of intermittent joint pain.  She has not noticed any joint swelling.  She has a nasal ulcer currently for which she has been using topical Vaseline.  She denies any history of oral ulcers, sicca symptoms, malar rash, photosensitivity, Raynaud's or lymphadenopathy.  There is no history of inflammatory arthritis.  Labs obtained in August were unremarkable which were reviewed.  Will recheck labs today.  Advised her to reduce Plaquenil to 200 mg p.o. once a day Monday to Friday.  Patient was in agreement.  I also advised her to contact us if she develops any new symptoms.  High risk medication use - Plaquenil 200 mg 1 tablet by mouth twice daily M-F. PLQ Eye Exam: 03/31/2023.  Patient was advised to get annual eye examination.  Information immunization was placed in the AVS.  Raynaud's disease without gangrene-symptoms are  manageable.  Chronic left shoulder pain -improved after the cortisone injection.  X-rays of the left shoulder on 07/31/2021 were consistent with [facility] joint arthritis.MRI of the shoulder joint from May 20, 2022  Primary osteoarthritis of both hands-she had bilateral PIP and DIP thickening and continues to have some stiffness.  Joint protection was discussed.  Trigger finger, left ring finger-she still has intermittent triggering.  Trigger finger, right ring finger-resolved.  Primary osteoarthritis of both knees-she denies any discomfort in the knee joints today.  No warmth swelling or effusion was noted.  Primary osteoarthritis of both feet-proper fitting was advised.  DDD (degenerative disc disease), cervical-she had some stiffness with range of motion.  Spondylosis of lumbar spine-she  had good Belotti without discomfort.  History of neuropathy  Vitamin D deficiency  History of hypertension-blood pressure was elevated at 145/76.  Repeat blood pressure was 150/64.  She was advised to monitor blood pressure closely and follow-up with her PCP.  Pure hypercholesterolemia  Moderate aortic valve insufficiency - patient has a heart murmur and is followed by Dr. Jacinto Halim.  Osteoporosis screening - I advised patient to get DEXA scan.  Patient would like to discuss with Dr. Allyne Gee.  Orders: Orders Placed This Encounter  Procedures   Protein / creatinine ratio, urine   CBC with Differential/Platelet   COMPLETE METABOLIC PANEL WITH GFR   Anti-DNA antibody, double-stranded   C3 and C4   Sedimentation rate   No orders of the defined types were placed in this encounter.    Follow-Up Instructions: Return in about 5 months (around 04/01/2024) for Systemic lupus, Osteoarthritis.   Pollyann Savoy, MD  Note - This record has been created using Animal nutritionist.  Chart creation errors have been sought, but may not always  have been located. Such creation errors do not reflect on  the  standard of medical care.

## 2023-10-17 NOTE — Telephone Encounter (Signed)
 Last Fill: 07/20/2023  Eye exam: 03/31/2023 WNL    Labs: 08/22/2023 Hgb 10.8, Hct 33.3  Next Visit: 10/31/2023  Last Visit: 04/19/2023  DX: Other systemic lupus erythematosus with other organ involvement   Current Dose per office note 04/19/2023: Plaquenil 200 mg 1 tablet by mouth twice daily M-F   Okay to refill Plaquenil?

## 2023-10-18 ENCOUNTER — Ambulatory Visit
Admission: RE | Admit: 2023-10-18 | Discharge: 2023-10-18 | Disposition: A | Discharge status: Home / Self Care | Payer: Medicare PPO | Source: Ambulatory Visit | Attending: Internal Medicine | Admitting: Internal Medicine

## 2023-10-18 DIAGNOSIS — Z1231 Encounter for screening mammogram for malignant neoplasm of breast: Secondary | ICD-10-CM

## 2023-10-31 ENCOUNTER — Ambulatory Visit: Payer: Medicare PPO | Attending: Rheumatology | Admitting: Rheumatology

## 2023-10-31 ENCOUNTER — Encounter: Payer: Self-pay | Admitting: Rheumatology

## 2023-10-31 VITALS — BP 150/64 | HR 59 | Resp 14 | Ht 64.0 in | Wt 178.0 lb

## 2023-10-31 DIAGNOSIS — M65341 Trigger finger, right ring finger: Secondary | ICD-10-CM | POA: Diagnosis not present

## 2023-10-31 DIAGNOSIS — M19042 Primary osteoarthritis, left hand: Secondary | ICD-10-CM

## 2023-10-31 DIAGNOSIS — I7300 Raynaud's syndrome without gangrene: Secondary | ICD-10-CM

## 2023-10-31 DIAGNOSIS — M3219 Other organ or system involvement in systemic lupus erythematosus: Secondary | ICD-10-CM

## 2023-10-31 DIAGNOSIS — M19071 Primary osteoarthritis, right ankle and foot: Secondary | ICD-10-CM

## 2023-10-31 DIAGNOSIS — M5030 Other cervical disc degeneration, unspecified cervical region: Secondary | ICD-10-CM

## 2023-10-31 DIAGNOSIS — M65342 Trigger finger, left ring finger: Secondary | ICD-10-CM

## 2023-10-31 DIAGNOSIS — M19041 Primary osteoarthritis, right hand: Secondary | ICD-10-CM

## 2023-10-31 DIAGNOSIS — Z13820 Encounter for screening for osteoporosis: Secondary | ICD-10-CM

## 2023-10-31 DIAGNOSIS — E7800 Pure hypercholesterolemia, unspecified: Secondary | ICD-10-CM

## 2023-10-31 DIAGNOSIS — M19072 Primary osteoarthritis, left ankle and foot: Secondary | ICD-10-CM

## 2023-10-31 DIAGNOSIS — I351 Nonrheumatic aortic (valve) insufficiency: Secondary | ICD-10-CM

## 2023-10-31 DIAGNOSIS — Z8669 Personal history of other diseases of the nervous system and sense organs: Secondary | ICD-10-CM

## 2023-10-31 DIAGNOSIS — G8929 Other chronic pain: Secondary | ICD-10-CM

## 2023-10-31 DIAGNOSIS — R202 Paresthesia of skin: Secondary | ICD-10-CM

## 2023-10-31 DIAGNOSIS — M170 Bilateral primary osteoarthritis of knee: Secondary | ICD-10-CM

## 2023-10-31 DIAGNOSIS — Z79899 Other long term (current) drug therapy: Secondary | ICD-10-CM

## 2023-10-31 DIAGNOSIS — M47816 Spondylosis without myelopathy or radiculopathy, lumbar region: Secondary | ICD-10-CM

## 2023-10-31 DIAGNOSIS — Z8679 Personal history of other diseases of the circulatory system: Secondary | ICD-10-CM

## 2023-10-31 DIAGNOSIS — E559 Vitamin D deficiency, unspecified: Secondary | ICD-10-CM

## 2023-10-31 DIAGNOSIS — M25512 Pain in left shoulder: Secondary | ICD-10-CM | POA: Diagnosis not present

## 2023-10-31 NOTE — Patient Instructions (Signed)
 Reduce hydroxychloroquine to 200 mg 1 tablet p.o. daily Monday to Friday  Vaccines You are taking a medication(s) that can suppress your immune system.  The following immunizations are recommended: Flu annually Covid-19  Td/Tdap (tetanus, diphtheria, pertussis) every 10 years Pneumonia (Prevnar 15 then Pneumovax 23 at least 1 year apart.  Alternatively, can take Prevnar 20 without needing additional dose) Shingrix: 2 doses from 4 weeks to 6 months apart  Please check with your PCP to make sure you are up to date.

## 2023-11-01 LAB — CBC WITH DIFFERENTIAL/PLATELET
Absolute Lymphocytes: 3074 {cells}/uL (ref 850–3900)
Absolute Monocytes: 612 {cells}/uL (ref 200–950)
Basophils Absolute: 29 {cells}/uL (ref 0–200)
Basophils Relative: 0.4 %
Eosinophils Absolute: 259 {cells}/uL (ref 15–500)
Eosinophils Relative: 3.6 %
HCT: 37.7 % (ref 35.0–45.0)
Hemoglobin: 11.7 g/dL (ref 11.7–15.5)
MCH: 25.6 pg — ABNORMAL LOW (ref 27.0–33.0)
MCHC: 31 g/dL — ABNORMAL LOW (ref 32.0–36.0)
MCV: 82.5 fL (ref 80.0–100.0)
MPV: 10.8 fL (ref 7.5–12.5)
Monocytes Relative: 8.5 %
Neutro Abs: 3226 {cells}/uL (ref 1500–7800)
Neutrophils Relative %: 44.8 %
Platelets: 314 10*3/uL (ref 140–400)
RBC: 4.57 10*6/uL (ref 3.80–5.10)
RDW: 12.6 % (ref 11.0–15.0)
Total Lymphocyte: 42.7 %
WBC: 7.2 10*3/uL (ref 3.8–10.8)

## 2023-11-01 LAB — COMPLETE METABOLIC PANEL WITH GFR
AG Ratio: 1.2 (calc) (ref 1.0–2.5)
ALT: 14 U/L (ref 6–29)
AST: 20 U/L (ref 10–35)
Albumin: 4.6 g/dL (ref 3.6–5.1)
Alkaline phosphatase (APISO): 91 U/L (ref 37–153)
BUN/Creatinine Ratio: 24 (calc) — ABNORMAL HIGH (ref 6–22)
BUN: 24 mg/dL (ref 7–25)
CO2: 24 mmol/L (ref 20–32)
Calcium: 10.3 mg/dL (ref 8.6–10.4)
Chloride: 106 mmol/L (ref 98–110)
Creat: 1 mg/dL — ABNORMAL HIGH (ref 0.60–0.95)
Globulin: 3.9 g/dL — ABNORMAL HIGH (ref 1.9–3.7)
Glucose, Bld: 81 mg/dL (ref 65–99)
Potassium: 4.4 mmol/L (ref 3.5–5.3)
Sodium: 138 mmol/L (ref 135–146)
Total Bilirubin: 0.3 mg/dL (ref 0.2–1.2)
Total Protein: 8.5 g/dL — ABNORMAL HIGH (ref 6.1–8.1)
eGFR: 56 mL/min/{1.73_m2} — ABNORMAL LOW (ref >=60)

## 2023-11-01 LAB — ANTI-DNA ANTIBODY, DOUBLE-STRANDED: ds DNA Ab: 2 [IU]/mL

## 2023-11-01 LAB — PROTEIN / CREATININE RATIO, URINE
Creatinine, Urine: 27 mg/dL (ref 20–275)
Total Protein, Urine: <4 mg/dL — ABNORMAL LOW (ref 5–24)

## 2023-11-01 LAB — C3 AND C4
C3 Complement: 150 mg/dL
C4 Complement: 27 mg/dL

## 2023-11-01 LAB — SEDIMENTATION RATE: Sed Rate: 29 mm/h (ref 0–30)

## 2023-11-01 NOTE — Progress Notes (Signed)
 Urine protein creatinine ratio is normal, CBC normal, creatinine is mildly elevated at 1.0, complements normal, sed rate normal, double-stranded DNA is pending.

## 2023-11-02 NOTE — Progress Notes (Signed)
 dsDNA is negative

## 2023-12-19 NOTE — Progress Notes (Signed)
 I,Jessica Booker, CMA,acting as a Neurosurgeon for Jessica Dung, MD.,have documented all relevant documentation on the behalf of Jessica Dung, MD,as directed by  Jessica Dung, MD while in the presence of Jessica Dung, MD.  Subjective:    Patient ID: Jessica Booker , female    DOB: Jan 09, 1941 , 83 y.o.   MRN: [id number]  Chief Complaint  Patient presents with   Annual Exam    Patient presents today for annual exam. She reports compliance with medications. Denies headache, chest pain & sob.    Hypertension   Hyperlipidemia    HPI Discussed the use of AI scribe software for clinical note transcription with the patient, who gave verbal consent to proceed.  History of Present Illness Jessica Booker is an 83 year old female with hypertension and high cholesterol who presents for a physical and blood pressure check.  She feels generally well and recently returned from a cruise vacation. She is concerned her blood pressure might be elevated due to rushing to the appointment. She is currently taking nifedipine  for hypertension, and her blood pressure was noted to be well-controlled.  She is on atorvastatin  for high cholesterol, taking it Monday through Saturday, although she is unsure of the exact schedule. There is a concern about plaque in the aorta, and the medication is necessary to prevent progression and reduce the risk of heart attack or stroke.  She uses a nebulizer and an inhaler, initially prescribed for pneumonia, and uses them when she feels out of breath or anxious, or when exposed to certain environmental triggers like flowers in a hardware store. She uses these less than once a week.  She takes omeprazole  as needed for symptoms that included difficulty breathing and burping, which led to an urgent care visit. She does not take it daily. B12 levels will be checked due to potential absorption issues with omeprazole  use.  In January, one of her stool cards was positive  for blood. She has a history of anemia, but recent iron profile was good. Blood count has improved, but further investigation by a GI specialist is necessary. No issues with hemorrhoids.  She has a history of lupus and takes Plaquenil , requiring eye exams every six months. She has had cataract surgery on one eye and is monitoring the other for potential surgery.  She reports sleeping well on her recent cruise but has trouble sleeping at home due to her mind racing. She is trying to meditate more to help with this.  She experiences knee pain, possibly due to arthritis, and mentions a popping sensation when getting out of the car. She uses ginger and turmeric to help with inflammation.  She performs breast exams occasionally and has regular bowel movements, although they can be influenced by her diet.   Hypertension This is a chronic problem. The current episode started more than 1 year ago. The problem has been gradually improving since onset. The problem is controlled. Associated symptoms include anxiety. Pertinent negatives include no headaches, neck pain, palpitations or peripheral edema. Risk factors for coronary artery disease include sedentary lifestyle. Past treatments include diuretics. There are no compliance problems.  There is no history of angina. There is no history of chronic renal disease.     Past Medical History:  Diagnosis Date   Asthma    Hypertension    Lupus      Family History  Problem Relation Age of Onset   Heart attack Mother    Stomach  cancer Father    Hypertension Sister    Heart disease Sister    Heart disease Sister      Current Outpatient Medications:    aspirin 81 MG tablet, Take 81 mg by mouth daily., Disp: , Rfl:    atorvastatin  (LIPITOR) 10 MG tablet, One tab po qd, except Sunday, Disp: 72 tablet, Rfl: 2   diclofenac  sodium (VOLTAREN ) 1 % GEL, Apply 2 g to 4 g to affected joint up to 4 times daily PRN., Disp: 4 Tube, Rfl: 2   hydroxychloroquine   (PLAQUENIL ) 200 MG tablet, TAKE 1 TABLET BY MOUTH TWICE DAILY MONDAY THROUGH FRIDAY ONLY., Disp: 120 tablet, Rfl: 0   lidocaine  (LIDODERM ) 5 %, Place 1 patch onto the skin daily. Remove & Discard patch within 12 hours or as directed by MD, Disp: 30 patch, Rfl: 0   linaclotide  (LINZESS ) 72 MCG capsule, Take 1 capsule (72 mcg total) by mouth as needed., Disp: 30 capsule, Rfl: 1   NIFEdipine  (ADALAT  CC) 90 MG 24 hr tablet, TAKE 1 TABLET BY MOUTH EVERY DAY, Disp: 90 tablet, Rfl: 3   omeprazole  (PRILOSEC) 40 MG capsule, TAKE 1 CAPSULE BY MOUTH EVERY DAY BEFORE BREAKFAST (Patient taking differently: as needed.), Disp: 30 capsule, Rfl: 0   valsartan -hydrochlorothiazide  (DIOVAN -HCT) 320-12.5 MG tablet, TAKE 1 TABLET BY MOUTH EVERY DAY IN THE MORNING, Disp: 90 tablet, Rfl: 3   albuterol  (PROVENTIL ) (2.5 MG/3ML) 0.083% nebulizer solution, Take 3 mLs (2.5 mg total) by nebulization every 6 (six) hours as needed for wheezing or shortness of breath., Disp: 75 mL, Rfl: 2   albuterol  (VENTOLIN  HFA) 108 (90 Base) MCG/ACT inhaler, Inhale 1-2 puffs into the lungs every 6 (six) hours as needed for wheezing or shortness of breath., Disp: 18 each, Rfl: 3   No Known Allergies    The patient states she uses post menopausal status for birth control. No LMP recorded. Patient is postmenopausal.. Negative for Dysmenorrhea. Negative for: breast discharge, breast lump(s), breast pain and breast self exam. Associated symptoms include abnormal vaginal bleeding. Pertinent negatives include abnormal bleeding (hematology), anxiety, decreased libido, depression, difficulty falling sleep, dyspareunia, history of infertility, nocturia, sexual dysfunction, sleep disturbances, urinary incontinence, urinary urgency, vaginal discharge and vaginal itching. Diet regular.The patient states her exercise level is    . The patient's tobacco use is:  Social History   Tobacco Use  Smoking Status Never   Passive exposure: Never  Smokeless Tobacco  Never  . She has been exposed to passive smoke. The patient's alcohol use is:  Social History   Substance and Sexual Activity  Alcohol Use No    Review of Systems  Constitutional: Negative.   HENT: Negative.    Eyes: Negative.   Respiratory: Negative.    Cardiovascular: Negative.  Negative for palpitations.  Gastrointestinal: Negative.   Endocrine: Negative.   Genitourinary: Negative.   Musculoskeletal:  Negative for neck pain.  Skin: Negative.   Allergic/Immunologic: Negative.   Neurological: Negative.  Negative for headaches.  Hematological: Negative.   Psychiatric/Behavioral: Negative.       Today's Vitals   12/20/23 1010  BP: 118/64  Temp: 98.3 F (36.8 C)  SpO2: 98%  Weight: 177 lb 6.4 oz (80.5 kg)  Height: 5\' 4"  (1.626 m)   Body mass index is 30.45 kg/m.  Wt Readings from Last 3 Encounters:  12/20/23 177 lb 6.4 oz (80.5 kg)  10/31/23 178 lb (80.7 kg)  08/22/23 170 lb 12.8 oz (77.5 kg)     Objective:  Physical Exam  Vitals and nursing note reviewed.  Constitutional:      Appearance: Normal appearance.  HENT:     Head: Normocephalic and atraumatic.     Right Ear: Tympanic membrane, ear canal and external ear normal.     Left Ear: Tympanic membrane, ear canal and external ear normal.     Nose: Nose normal.     Mouth/Throat:     Mouth: Mucous membranes are moist.     Pharynx: Oropharynx is clear.  Eyes:     Extraocular Movements: Extraocular movements intact.     Conjunctiva/sclera: Conjunctivae normal.     Pupils: Pupils are equal, round, and reactive to light.  Cardiovascular:     Rate and Rhythm: Normal rate and regular rhythm.     Pulses: Normal pulses.     Heart sounds: Normal heart sounds.  Pulmonary:     Effort: Pulmonary effort is normal.     Breath sounds: Normal breath sounds.  Chest:  Breasts:    Tanner Score is 5.     Right: Normal.     Left: Normal.  Abdominal:     General: Abdomen is flat. Bowel sounds are normal.     Palpations:  Abdomen is soft.  Genitourinary:    Comments: deferred Musculoskeletal:        General: Normal range of motion.     Cervical back: Normal range of motion and neck supple.     Right lower leg: No edema.     Left lower leg: No edema.     Comments: Crepitus b/l knees  Skin:    General: Skin is warm and dry.  Neurological:     General: No focal deficit present.     Mental Status: She is alert and oriented to person, place, and time.  Psychiatric:        Mood and Affect: Mood normal.        Behavior: Behavior normal.         Assessment And Plan:     Encounter for general adult medical examination w/o abnormal findings Assessment & Plan: A full exam was performed.  Importance of monthly self breast exams was discussed with the patient.  She is advised to get 30-45 minutes of regular exercise, no less than four to five days per week. Both weight-bearing and aerobic exercises are recommended.  She is advised to follow a healthy diet with at least six fruits/veggies per day, decrease intake of red meat and other saturated fats and to increase fish intake to twice weekly.  Meats/fish should not be fried -- baked, boiled or broiled is preferable. It is also important to cut back on your sugar intake.  Be sure to read labels - try to avoid anything with added sugar, high fructose corn syrup or other sweeteners.  If you must use a sweetener, you can try stevia or monkfruit.  It is also important to avoid artificially sweetened foods/beverages and diet drinks. Lastly, wear SPF 50 sunscreen on exposed skin and when in direct sunlight for an extended period of time.  Be sure to avoid fast food restaurants and aim for at least 60 ounces of water daily.       Hypertensive heart disease without heart failure Assessment & Plan: Chronic, well controlled. EKG Performed, SB w/o acute changes. She will continue with valsartan /hct 320/12.5mg  daily, encouraged to follow low sodium diet. She will f/u in six  months.    Orders: -     EKG 12-Lead -  POCT urinalysis dipstick -     Microalbumin / creatinine urine ratio -     BMP8+EGFR  Atherosclerosis of aorta (HCC) Assessment & Plan: Chronic, LDL goal is less than 70.  She will continue with atorvastatin . Unfortunately, she doesn't tolerate daily dosing due to leg cramps.   Orders: -     Lipid panel  Pure hypercholesterolemia Assessment & Plan: Chronic, LDL goal is less than 70 due to underlying aortic atherosclerosis. She is not sure how she is taking atorvastatin . Will have staff call her later today to confirm.   Orders: -     Lipid panel  Seasonal allergic rhinitis due to pollen -     Albuterol  Sulfate; Take 3 mLs (2.5 mg total) by nebulization every 6 (six) hours as needed for wheezing or shortness of breath.  Dispense: 75 mL; Refill: 2 -     Albuterol  Sulfate HFA; Inhale 1-2 puffs into the lungs every 6 (six) hours as needed for wheezing or shortness of breath.  Dispense: 18 each; Refill: 3  Normocytic anemia Assessment & Plan: Previous anemia noted, recent iron profile normal. Blood count improved. Follow-up with GI doctor due to positive stool card. - Coordinate follow-up with GI doctor regarding positive stool card.   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 Regular eye exams due to medication. Discussed potential knee arthritis and anti-inflammatory measures. - Continue Plaquenil  as prescribed. - Encourage use of ginger and turmeric for anti-inflammatory effects. - Schedule regular eye exams every six months.   Guaiac positive stools Assessment & Plan: She was referred to GI in January; unfortunately, she did not call them back after 3 attempts. Will have referral coordinator contact pt with office information so she can schedule appt for further evaluation. She has not noticed any blood in her stools.    Cataract of left eye, unspecified  cataract type Assessment & Plan: Cataract in left eye noted. Surgery on right eye completed. Monitor left eye cataract. - Monitor left eye cataract and address when symptomatic.   Estrogen deficiency -     DG Bone Density; Future  Class 1 obesity due to excess calories with serious comorbidity and body mass index (BMI) of 30.0 to 30.9 in adult Assessment & Plan: She is encouraged to aim for at least 150 minutes of exercise/week. Her BMI is acceptable for her demographics. Also encouraged to incorporate strength training exercises to help maintain/build muscle mass.    Drug therapy -     Vitamin B12   Return for 1 year HM, 6 month bp & chol. Patient was given opportunity to ask questions. Patient verbalized understanding of the plan and was able to repeat key elements of the plan. All questions were answered to their satisfaction.   I, Jessica Dung, MD, have reviewed all documentation for this visit. The documentation on 12/20/23 for the exam, diagnosis, procedures, and orders are all accurate and complete.

## 2023-12-19 NOTE — Patient Instructions (Signed)

## 2023-12-20 ENCOUNTER — Ambulatory Visit: Payer: Medicare PPO | Admitting: Internal Medicine

## 2023-12-20 ENCOUNTER — Encounter: Payer: Self-pay | Admitting: Internal Medicine

## 2023-12-20 VITALS — BP 118/64 | Temp 98.3°F | Ht 64.0 in | Wt 177.4 lb

## 2023-12-20 DIAGNOSIS — Z79899 Other long term (current) drug therapy: Secondary | ICD-10-CM

## 2023-12-20 DIAGNOSIS — D649 Anemia, unspecified: Secondary | ICD-10-CM | POA: Diagnosis not present

## 2023-12-20 DIAGNOSIS — D509 Iron deficiency anemia, unspecified: Secondary | ICD-10-CM

## 2023-12-20 DIAGNOSIS — R195 Other fecal abnormalities: Secondary | ICD-10-CM

## 2023-12-20 DIAGNOSIS — J301 Allergic rhinitis due to pollen: Secondary | ICD-10-CM | POA: Diagnosis not present

## 2023-12-20 DIAGNOSIS — I700 Atherosclerosis of aorta: Secondary | ICD-10-CM

## 2023-12-20 DIAGNOSIS — F5101 Primary insomnia: Secondary | ICD-10-CM

## 2023-12-20 DIAGNOSIS — E6609 Other obesity due to excess calories: Secondary | ICD-10-CM

## 2023-12-20 DIAGNOSIS — Z6830 Body mass index (BMI) 30.0-30.9, adult: Secondary | ICD-10-CM

## 2023-12-20 DIAGNOSIS — M3219 Other organ or system involvement in systemic lupus erythematosus: Secondary | ICD-10-CM | POA: Diagnosis not present

## 2023-12-20 DIAGNOSIS — E7800 Pure hypercholesterolemia, unspecified: Secondary | ICD-10-CM

## 2023-12-20 DIAGNOSIS — E2839 Other primary ovarian failure: Secondary | ICD-10-CM

## 2023-12-20 DIAGNOSIS — Z0000 Encounter for general adult medical examination without abnormal findings: Secondary | ICD-10-CM | POA: Insufficient documentation

## 2023-12-20 DIAGNOSIS — H269 Unspecified cataract: Secondary | ICD-10-CM

## 2023-12-20 DIAGNOSIS — I119 Hypertensive heart disease without heart failure: Secondary | ICD-10-CM

## 2023-12-20 DIAGNOSIS — E66811 Obesity, class 1: Secondary | ICD-10-CM

## 2023-12-20 LAB — POCT URINALYSIS DIPSTICK
Bilirubin, UA: NEGATIVE
Blood, UA: NEGATIVE
Glucose, UA: NEGATIVE
Ketones, UA: NEGATIVE
Leukocytes, UA: NEGATIVE
Nitrite, UA: NEGATIVE
Protein, UA: NEGATIVE
Spec Grav, UA: 1.015 (ref 1.010–1.025)
Urobilinogen, UA: 0.2 U/dL
pH, UA: 5.5 (ref 5.0–8.0)

## 2023-12-20 MED ORDER — ALBUTEROL SULFATE HFA 108 (90 BASE) MCG/ACT IN AERS: 1.0000 | INHALATION_SPRAY | Freq: Four times a day (QID) | RESPIRATORY_TRACT | 3 refills | Status: AC | PRN

## 2023-12-20 MED ORDER — ALBUTEROL SULFATE (2.5 MG/3ML) 0.083% IN NEBU
2.5000 mg | INHALATION_SOLUTION | Freq: Four times a day (QID) | RESPIRATORY_TRACT | 2 refills | Status: AC | PRN
Start: 2023-12-20 — End: 2024-12-19

## 2023-12-20 NOTE — Assessment & Plan Note (Signed)
 Managed with Plaquenil . Regular eye exams due to medication. Discussed potential knee arthritis and anti-inflammatory measures. - Continue Plaquenil  as prescribed. - Encourage use of ginger and turmeric for anti-inflammatory effects. - Schedule regular eye exams every six months.

## 2023-12-20 NOTE — Assessment & Plan Note (Signed)
 Chronic, LDL goal is less than 70 due to underlying aortic atherosclerosis. She is not sure how she is taking atorvastatin . Will have staff call her later today to confirm.

## 2023-12-20 NOTE — Assessment & Plan Note (Signed)
 Chronic, well controlled. EKG Performed, SB w/o acute changes. She will continue with valsartan /hct 320/12.5mg  daily, encouraged to follow low sodium diet. She will f/u in six months.

## 2023-12-20 NOTE — Assessment & Plan Note (Signed)
 She is encouraged to aim for at least 150 minutes of exercise/week. Her BMI is acceptable for her demographics. Also encouraged to incorporate strength training exercises to help maintain/build muscle mass.

## 2023-12-20 NOTE — Assessment & Plan Note (Signed)
Chronic, LDL goal is less than 70.  She will continue with atorvastatin. Unfortunately, she doesn't tolerate daily dosing due to leg cramps.

## 2023-12-20 NOTE — Assessment & Plan Note (Signed)

## 2023-12-21 ENCOUNTER — Telehealth: Payer: Self-pay | Admitting: Internal Medicine

## 2023-12-21 LAB — MICROALBUMIN / CREATININE URINE RATIO
Creatinine, Urine: 103.4 mg/dL
Microalb/Creat Ratio: 7 mg/g{creat} (ref 0–29)
Microalbumin, Urine: 7.1 ug/mL

## 2023-12-21 NOTE — Telephone Encounter (Unsigned)
 Copied from CRM [id number]. Topic: Clinical - Medication Question >> Dec 21, 2023  9:35 AM Baldomero Bone wrote: Reason for CRM: Patient is calling to advise the medication she was speaking with Dr Elnita Hai about is atorvastatin . The patient wants to know if she needs to continue taking it or not. Callback number is [phone]

## 2023-12-22 DIAGNOSIS — R195 Other fecal abnormalities: Secondary | ICD-10-CM | POA: Diagnosis not present

## 2023-12-22 DIAGNOSIS — Z1211 Encounter for screening for malignant neoplasm of colon: Secondary | ICD-10-CM | POA: Diagnosis not present

## 2023-12-22 DIAGNOSIS — E669 Obesity, unspecified: Secondary | ICD-10-CM | POA: Diagnosis not present

## 2023-12-22 DIAGNOSIS — K219 Gastro-esophageal reflux disease without esophagitis: Secondary | ICD-10-CM | POA: Diagnosis not present

## 2023-12-22 DIAGNOSIS — I10 Essential (primary) hypertension: Secondary | ICD-10-CM | POA: Diagnosis not present

## 2023-12-22 DIAGNOSIS — Z860100 Personal history of colon polyps, unspecified: Secondary | ICD-10-CM | POA: Diagnosis not present

## 2023-12-25 DIAGNOSIS — R195 Other fecal abnormalities: Secondary | ICD-10-CM | POA: Insufficient documentation

## 2023-12-25 DIAGNOSIS — H269 Unspecified cataract: Secondary | ICD-10-CM | POA: Insufficient documentation

## 2023-12-25 NOTE — Assessment & Plan Note (Signed)
 Previous anemia noted, recent iron profile normal. Blood count improved. Follow-up with GI doctor due to positive stool card. - Coordinate follow-up with GI doctor regarding positive stool card.

## 2023-12-25 NOTE — Assessment & Plan Note (Signed)
 Cataract in left eye noted. Surgery on right eye completed. Monitor left eye cataract. - Monitor left eye cataract and address when symptomatic.

## 2023-12-25 NOTE — Assessment & Plan Note (Signed)
 She was referred to GI in January; unfortunately, she did not call them back after 3 attempts. Will have referral coordinator contact pt with office information so she can schedule appt for further evaluation. She has not noticed any blood in her stools.

## 2024-01-06 ENCOUNTER — Telehealth: Payer: Self-pay

## 2024-01-06 NOTE — Telephone Encounter (Signed)
 Left message to call back to scheule tele preop appt.

## 2024-01-06 NOTE — Telephone Encounter (Signed)
   Pre-operative Risk Assessment    Patient Name: Jessica Booker  DOB: November 22, 1940 MRN: [id number]   Date of last office visit: 05/30/2024, Dr. Knox Perl  Date of next office visit: NONE   Request for Surgical Clearance    Procedure:  Colonoscopy  Date of Surgery:  Clearance 01/18/24                                Surgeon: Dr. Lana Pina Surgeon's Group or Practice Name: [facility], [place] Phone number: [phone] Fax number: [phone]   Type of Clearance Requested:   - Medical  - Pharmacy:  Hold Aspirin     Type of Anesthesia:  Propofol    Additional requests/questions:    SignedDelroy Fields   01/06/2024, 3:52 PM

## 2024-01-06 NOTE — Telephone Encounter (Signed)
   Name: Jessica Booker  DOB: [birth year]  MRN: [id number]  Primary Cardiologist: Knox Perl, MD   Preoperative team, please contact this patient and set up a phone call appointment for further preoperative risk assessment. Please obtain consent and complete medication review. Thank you for your help.  I confirm that guidance regarding antiplatelet and oral anticoagulation therapy has been completed and, if necessary, noted below.  Ideally aspirin should be continued without interruption, however if the bleeding risk is too great, aspirin may be held for 5-7 days prior to surgery. Please resume aspirin post operatively when it is felt to be safe from a bleeding standpoint.    I also confirmed the patient resides in the state of [place] . As per [facility] Medical Board telemedicine laws, the patient must reside in the state in which the provider is licensed.   Morey Ar, NP 01/06/2024, 4:26 PM [facility]

## 2024-01-09 ENCOUNTER — Telehealth: Payer: Self-pay | Admitting: *Deleted

## 2024-01-09 NOTE — Telephone Encounter (Signed)
 Pt has been scheduled tele preop appt 01/11/24. Med rec and consent are done.      Patient Consent for Virtual Visit        Jessica Booker has provided verbal consent on 01/09/2024 for a virtual visit (video or telephone).   CONSENT FOR VIRTUAL VISIT FOR:  Jessica Booker  By participating in this virtual visit I agree to the following:  I hereby voluntarily request, consent and authorize [facility] and its employed or contracted physicians, physician assistants, nurse practitioners or other licensed health care professionals (the Practitioner), to provide me with telemedicine health care services (the "Services") as deemed necessary by the treating Practitioner. I acknowledge and consent to receive the Services by the Practitioner via telemedicine. I understand that the telemedicine visit will involve communicating with the Practitioner through live audiovisual communication technology and the disclosure of certain medical information by electronic transmission. I acknowledge that I have been given the opportunity to request an in-person assessment or other available alternative prior to the telemedicine visit and am voluntarily participating in the telemedicine visit.  I understand that I have the right to withhold or withdraw my consent to the use of telemedicine in the course of my care at any time, without affecting my right to future care or treatment, and that the Practitioner or I may terminate the telemedicine visit at any time. I understand that I have the right to inspect all information obtained and/or recorded in the course of the telemedicine visit and may receive copies of available information for a reasonable fee.  I understand that some of the potential risks of receiving the Services via telemedicine include:  Delay or interruption in medical evaluation due to technological equipment failure or disruption; Information transmitted may not be sufficient (e.g. poor  resolution of images) to allow for appropriate medical decision making by the Practitioner; and/or  In rare instances, security protocols could fail, causing a breach of personal health information.  Furthermore, I acknowledge that it is my responsibility to provide information about my medical history, conditions and care that is complete and accurate to the best of my ability. I acknowledge that Practitioner's advice, recommendations, and/or decision may be based on factors not within their control, such as incomplete or inaccurate data provided by me or distortions of diagnostic images or specimens that may result from electronic transmissions. I understand that the practice of medicine is not an exact science and that Practitioner makes no warranties or guarantees regarding treatment outcomes. I acknowledge that a copy of this consent can be made available to me via my patient portal [facility] MyChart), or I can request a printed copy by calling the office of [facility].    I understand that my insurance will be billed for this visit.   I have read or had this consent read to me. I understand the contents of this consent, which adequately explains the benefits and risks of the Services being provided via telemedicine.  I have been provided ample opportunity to ask questions regarding this consent and the Services and have had my questions answered to my satisfaction. I give my informed consent for the services to be provided through the use of telemedicine in my medical care

## 2024-01-09 NOTE — Telephone Encounter (Signed)
 Pt has been scheduled tele preop appt 01/11/24. Med rec and consent are done.

## 2024-01-11 ENCOUNTER — Ambulatory Visit: Attending: Cardiology | Admitting: Emergency Medicine

## 2024-01-11 DIAGNOSIS — Z01810 Encounter for preprocedural cardiovascular examination: Secondary | ICD-10-CM

## 2024-01-11 NOTE — Progress Notes (Signed)
 Virtual Visit via Telephone Note   Because of VON PEAN co-morbid illnesses, she is at least at moderate risk for complications without adequate follow up.  This format is felt to be most appropriate for this patient at this time.  Due to technical limitations with video connection (technology), today's appointment will be conducted as an audio only telehealth visit, and Jessica Booker verbally agreed to proceed in this manner.   All issues noted in this document were discussed and addressed.  No physical exam could be performed with this format.  Evaluation Performed:  Preoperative cardiovascular risk assessment _____________   Date:  01/11/2024   Patient ID:  Jessica, Booker 06/08/1941, MRN [id number] Patient Location:  Home Provider location:   Office  Primary Care Provider:  Cleave Curling, MD Primary Cardiologist:  Knox Perl, MD  Chief Complaint / Patient Profile   83 y.o. y/o female with a h/o lupus arthritis, hypertension, obesity, upper glycemia, chest pain due to GERD, moderate aortic regurgitation, pure hypercholesterolemia who is pending colonoscopy on 01/18/2024 with [facility] by Dr. Tova Fresh and presents today for telephonic preoperative cardiovascular risk assessment.  History of Present Illness    Jessica Booker is a 83 y.o. female who presents via audio/video conferencing for a telehealth visit today.  Pt was last seen in cardiology clinic on 05/31/2023 by Dr. Berry Bristol.  At that time Jessica Booker was doing well.  The patient is now pending procedure as outlined above. Since her last visit, she denies chest pain, shortness of breath, lower extremity edema, fatigue, palpitations, melena, hematuria, hemoptysis, diaphoresis, weakness, presyncope, syncope, orthopnea, and PND.  Today patient is doing well overall.  She is without any cardiovascular concerns or complaints.  She notes that she does stay fairly active.  She walks up 17 stairs a day to  get to her apartment without any issues.  She is without any anginal symptoms today.  She is able to complete greater than 4 METS.  Past Medical History    Past Medical History:  Diagnosis Date   Asthma    Hypertension    Lupus    Past Surgical History:  Procedure Laterality Date   ABDOMINAL HYSTERECTOMY     CHOLECYSTECTOMY     SHOULDER ARTHROSCOPY W/ ROTATOR CUFF REPAIR     R shoulder   TONSILLECTOMY      Allergies  No Known Allergies  Home Medications    Prior to Admission medications   Medication Sig Start Date End Date Taking? Authorizing Provider  albuterol  (PROVENTIL ) (2.5 MG/3ML) 0.083% nebulizer solution Take 3 mLs (2.5 mg total) by nebulization every 6 (six) hours as needed for wheezing or shortness of breath. 12/20/23 12/19/24  Cleave Curling, MD  albuterol  (VENTOLIN  HFA) 108 [phone] Base) MCG/ACT inhaler Inhale 1-2 puffs into the lungs every 6 (six) hours as needed for wheezing or shortness of breath. 12/20/23   Cleave Curling, MD  aspirin 81 MG tablet Take 81 mg by mouth daily.    [provider]  atorvastatin  (LIPITOR) 10 MG tablet One tab po qd, except Sunday 12/14/22   Cleave Curling, MD  diclofenac  sodium (VOLTAREN ) 1 % GEL Apply 2 g to 4 g to affected joint up to 4 times daily PRN. 10/30/18   Romayne Clubs, PA-C  hydroxychloroquine  (PLAQUENIL ) 200 MG tablet TAKE 1 TABLET BY MOUTH TWICE DAILY MONDAY THROUGH FRIDAY ONLY. 10/17/23   Romayne Clubs, PA-C  lidocaine  (LIDODERM ) 5 % Place 1 patch onto  the skin daily. Remove & Discard patch within 12 hours or as directed by MD 12/24/21   Charmayne Cooper, MD  linaclotide  (LINZESS ) 72 MCG capsule Take 1 capsule (72 mcg total) by mouth as needed. 05/17/22   Cleave Curling, MD  NIFEdipine  (ADALAT  CC) 90 MG 24 hr tablet TAKE 1 TABLET BY MOUTH EVERY DAY 07/08/23   Knox Perl, MD  omeprazole  (PRILOSEC) 40 MG capsule TAKE 1 CAPSULE BY MOUTH EVERY DAY BEFORE BREAKFAST 06/23/23   Knox Perl, MD  valsartan -hydrochlorothiazide   (DIOVAN -HCT) 320-12.5 MG tablet TAKE 1 TABLET BY MOUTH EVERY DAY IN THE MORNING 07/08/23   Knox Perl, MD    Physical Exam    Vital Signs:  Jessica Booker does not have vital signs available for review today.  Given telephonic nature of communication, physical exam is limited. AAOx3. NAD. Normal affect.  Speech and respirations are unlabored.  Accessory Clinical Findings    None  Assessment & Plan    1.  Preoperative Cardiovascular Risk Assessment: According to the Revised Cardiac Risk Index (RCRI), her Perioperative Risk of Major Cardiac Event is (%): 0.4. Her Functional Capacity in METs is: 5.62 according to the Duke Activity Status Index (DASI). Therefore, based on ACC/AHA guidelines, patient would be at acceptable risk for the planned procedure without further cardiovascular testing.  The patient was advised that if she develops new symptoms prior to surgery to contact our office to arrange for a follow-up visit, and she verbalized understanding.  Ideally aspirin should be continued without interruption, however if the bleeding risk is too great, aspirin may be held for 5-7 days prior to surgery. Please resume aspirin post operatively when it is felt to be safe from a bleeding standpoint.    A copy of this note will be routed to requesting surgeon.  Time:   Today, I have spent 8 minutes with the patient with telehealth technology discussing medical history, symptoms, and management plan.     Ava Boatman, NP  01/11/2024, 10:51 AM

## 2024-01-18 DIAGNOSIS — K319 Disease of stomach and duodenum, unspecified: Secondary | ICD-10-CM | POA: Diagnosis not present

## 2024-01-18 DIAGNOSIS — K219 Gastro-esophageal reflux disease without esophagitis: Secondary | ICD-10-CM | POA: Diagnosis not present

## 2024-01-18 DIAGNOSIS — R195 Other fecal abnormalities: Secondary | ICD-10-CM | POA: Diagnosis not present

## 2024-01-18 DIAGNOSIS — K449 Diaphragmatic hernia without obstruction or gangrene: Secondary | ICD-10-CM | POA: Diagnosis not present

## 2024-01-18 DIAGNOSIS — Z860100 Personal history of colon polyps, unspecified: Secondary | ICD-10-CM | POA: Diagnosis not present

## 2024-01-18 DIAGNOSIS — K5730 Diverticulosis of large intestine without perforation or abscess without bleeding: Secondary | ICD-10-CM | POA: Diagnosis not present

## 2024-01-18 DIAGNOSIS — Z1211 Encounter for screening for malignant neoplasm of colon: Secondary | ICD-10-CM | POA: Diagnosis not present

## 2024-01-18 DIAGNOSIS — D509 Iron deficiency anemia, unspecified: Secondary | ICD-10-CM | POA: Diagnosis not present

## 2024-01-18 LAB — HM COLONOSCOPY

## 2024-01-19 ENCOUNTER — Encounter: Payer: Self-pay | Admitting: Internal Medicine

## 2024-01-30 ENCOUNTER — Other Ambulatory Visit: Payer: Self-pay | Admitting: Internal Medicine

## 2024-02-07 DIAGNOSIS — K449 Diaphragmatic hernia without obstruction or gangrene: Secondary | ICD-10-CM | POA: Diagnosis not present

## 2024-02-07 DIAGNOSIS — K219 Gastro-esophageal reflux disease without esophagitis: Secondary | ICD-10-CM | POA: Diagnosis not present

## 2024-02-07 DIAGNOSIS — K5730 Diverticulosis of large intestine without perforation or abscess without bleeding: Secondary | ICD-10-CM | POA: Diagnosis not present

## 2024-03-09 NOTE — Progress Notes (Signed)
 Office Visit Note  Patient: Jessica Booker             Date of Birth: 1941-06-23           MRN: [id number]             PCP: Jarold Medici, MD Referring: Jarold Medici, MD Visit Date: 03/23/2024 Occupation: @GUAROCC @  Subjective:  Popping sensation in left knee   History of Present Illness: Jessica Booker is a 83 y.o. female with systemic lupus and osteoarthritis.  She returns today after her last visit in March 2025.  She states recently she has been having some popping sensation in her left knee joint.  She believes that the left knee looks larger than the right knee.  She is not had discomfort in any other joints.  She feels some discomfort in her arm muscles.  She continues to have some discomfort in her hands.  She is on Plaquenil  200 mg p.o. twice daily Monday to Friday without any interruption.  She denies any morning stiffness.  There is no history of sicca symptoms, malar rash, lymphadenopathy or raynaud's phenominon.    Activities of Daily Living:  Patient reports morning stiffness for 0 minutes.   Patient Denies nocturnal pain.  Difficulty dressing/grooming: Denies Difficulty climbing stairs: Denies Difficulty getting out of chair: Denies Difficulty using hands for taps, buttons, cutlery, and/or writing: Denies  Review of Systems  Constitutional:  Negative for fatigue.  HENT:  Positive for mouth sores. Negative for mouth dryness.   Eyes:  Negative for dryness.  Respiratory:  Negative for shortness of breath.   Cardiovascular:  Negative for chest pain and palpitations.  Gastrointestinal:  Negative for blood in stool, constipation and diarrhea.  Endocrine: Negative for increased urination.  Genitourinary:  Negative for involuntary urination.  Musculoskeletal:  Positive for joint pain, joint pain, myalgias, muscle tenderness and myalgias. Negative for gait problem, joint swelling, muscle weakness and morning stiffness.  Skin:  Positive for hair loss and  sensitivity to sunlight. Negative for color change and rash.  Allergic/Immunologic: Negative for susceptible to infections.  Neurological:  Negative for dizziness and headaches.  Hematological:  Negative for swollen glands.  Psychiatric/Behavioral:  Positive for sleep disturbance. Negative for depressed mood. The patient is not nervous/anxious.     PMFS History:  Patient Active Problem List   Diagnosis Date Noted   Guaiac positive stools 12/25/2023   Cataract of left eye 12/25/2023   Encounter for general adult medical examination w/o abnormal findings 12/20/2023   Seasonal allergic rhinitis due to pollen 12/20/2023   Normocytic anemia 08/28/2023   Overweight with body mass index (BMI) of 29 to 29.9 in adult 08/28/2023   Sleep related leg cramps 04/03/2023   Atherosclerosis of aorta (HCC) 04/03/2023   Hypertensive heart disease without heart failure 04/03/2023   Pure hypercholesterolemia 04/03/2023   Class 1 obesity due to excess calories with serious comorbidity and body mass index (BMI) of 30.0 to 30.9 in adult 04/03/2023   Wheezing 01/10/2023   Acute cough 01/10/2023   Pneumonia of right middle lobe due to infectious organism 01/10/2023   Moderate persistent asthma with acute exacerbation 01/10/2023   Primary insomnia 08/31/2022   Dyspnea 04/11/2019   Belching 04/11/2019   Constipation 04/11/2019   Essential hypertension 11/02/2018   Lupus 11/02/2018   Primary osteoarthritis of both knees 06/06/2018   Cough variant asthma 08/25/2017   Systemic lupus erythematosus (HCC) 02/18/2017   High risk medication use 02/18/2017   Raynaud's disease  without gangrene 02/18/2017   DDD (degenerative disc disease), lumbar 02/18/2017   Primary osteoarthritis of both hands 02/18/2017   Primary osteoarthritis of both feet 02/18/2017   Vitamin D  deficiency 02/18/2017   History of hypertension 02/18/2017   History of neuropathy 02/18/2017    Past Medical History:  Diagnosis Date   Asthma     Hypertension    Lupus     Family History  Problem Relation Age of Onset   Heart attack Mother    Stomach cancer Father    Hypertension Sister    Heart disease Sister    Heart disease Sister    Past Surgical History:  Procedure Laterality Date   ABDOMINAL HYSTERECTOMY     CHOLECYSTECTOMY     SHOULDER ARTHROSCOPY W/ ROTATOR CUFF REPAIR     R shoulder   TONSILLECTOMY     Social History   Social History Narrative   Not on file   Immunization History  Administered Date(s) Administered   Fluad Quad(high Dose 65+) 05/27/2021   Influenza, High Dose Seasonal PF 06/21/2017, 05/29/2018, 05/16/2019   Influenza-Unspecified 05/30/2018, 05/16/2019, 05/13/2020, 06/14/2022   MODERNA COVID-19 SARS-COV-2 PEDS BIVALENT BOOSTER 33yr-64yr 05/31/2023   PFIZER Comirnaty(Gray Top)Covid-19 Tri-Sucrose Vaccine 12/25/2020   PFIZER(Purple Top)SARS-COV-2 Vaccination 10/04/2019, 10/30/2019, 05/23/2020   Pfizer Covid-19 Vaccine Bivalent Booster 54yrs & up 07/19/2021, 07/12/2022   Pneumococcal Conjugate-13 07/06/2019   Pneumococcal Polysaccharide-23 09/17/2013   RSV,unspecified 09/09/2023   Zoster Recombinant(Shingrix) 05/29/2018, 08/01/2018   Zoster, Live 10/17/2020     Objective: Vital Signs: BP (!) 139/53 (BP Location: Right Arm, Patient Position: Sitting)   Pulse 64   Resp 15   Ht 5' 4.5 (1.638 m)   Wt 176 lb 6.4 oz (80 kg)   BMI 29.81 kg/m    Physical Exam Vitals and nursing note reviewed.  Constitutional:      Appearance: She is well-developed.  HENT:     Head: Normocephalic and atraumatic.  Eyes:     Conjunctiva/sclera: Conjunctivae normal.  Cardiovascular:     Rate and Rhythm: Normal rate and regular rhythm.     Heart sounds: Normal heart sounds.  Pulmonary:     Effort: Pulmonary effort is normal.     Breath sounds: Normal breath sounds.  Abdominal:     General: Bowel sounds are normal.     Palpations: Abdomen is soft.  Musculoskeletal:     Cervical back: Normal range of  motion.  Lymphadenopathy:     Cervical: No cervical adenopathy.  Skin:    General: Skin is warm and dry.     Capillary Refill: Capillary refill takes less than 2 seconds.  Neurological:     Mental Status: She is alert and oriented to person, place, and time.  Psychiatric:        Behavior: Behavior normal.      Musculoskeletal Exam: She had limited lateral rotation of the cervical spine.  No tenderness over thoracic or lumbar spine.  Shoulders, elbows, wrist, MCPs PIPs and DIPs were in good range of motion.  Bilateral PIP and DIP thickening with no synovitis was noted.  Hip joints and knee joints with good range of motion.  She has crepitus in bilateral knee joints more prominent in the left knee.  No synovitis of warmth or swelling was noted.  There was no tenderness with ankles or MTPs.  Hammertoes were noted.  CDAI Exam: CDAI Score: -- Patient Global: --; Provider Global: -- Swollen: --; Tender: -- Joint Exam 03/23/2024   No joint exam  has been documented for this visit   There is currently no information documented on the homunculus. Go to the Rheumatology activity and complete the homunculus joint exam.  Investigation: No additional findings.  Imaging: No results found.  Recent Labs: Lab Results  Component Value Date   WBC 7.2 10/31/2023   HGB 11.7 10/31/2023   PLT 314 10/31/2023   NA 138 10/31/2023   K 4.4 10/31/2023   CL 106 10/31/2023   CO2 24 10/31/2023   GLUCOSE 81 10/31/2023   BUN 24 10/31/2023   CREATININE 1.00 (H) 10/31/2023   BILITOT 0.3 10/31/2023   ALKPHOS 74 12/23/2021   AST 20 10/31/2023   ALT 14 10/31/2023   PROT 8.5 (H) 10/31/2023   ALBUMIN 4.2 12/23/2021   CALCIUM  10.3 10/31/2023   GFRAA 73 06/27/2020    Speciality Comments: PLQ Eye Exam: 03/31/2023 WNL @ [facility] Follow up in 1 year.   Procedures:  No procedures performed Allergies: Patient has no known allergies.   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  There is no history of oral ulcers, nasal ulcers, malar rash, photosensitivity Raynauds or lymphadenopathy.  There is no history of recent inflammatory arthritis.  October 31, 2023 labs showed sedimentation rate 29, complements normal, double-stranded DNA negative.  Will recheck labs today.  Plan: Protein / creatinine ratio, urine, Anti-DNA antibody, double-stranded, C3 and C4, Sedimentation rate, ANA  High risk medication use - Plaquenil  200 mg 1 tablet by mouth twice daily M-F. PLQ Eye Exam: 03/31/2023.  Patient states her repeat eye examination is scheduled next month.- Plan: CBC with Differential/Platelet, Comprehensive metabolic panel with GFR today.  Raynaud's disease without gangrene-not symptomatic.  Keeping core temperature warm and warm clothing was discussed.  Chronic left shoulder pain -currently not very symptomatic.  She continues to have some discomfort in her arms.  X-rays of the left shoulder on 07/31/2021 were consistent with [facility] joint arthritis.MRI of the shoulder joint from May 20, 2022  Primary osteoarthritis of both hands-she had bilateral PIP and DIP thickening with no synovitis.  Trigger finger, left ring finger-she reports intermittent triggering.  Trigger finger, right ring finger-not symptomatic.  Primary osteoarthritis of both knees-I reviewed her x-rays from 2019.  Left knee joint x-ray showed moderate osteoarthritis and moderate chondromalacia patella.  She complains of crepitus in her knee joint.  Due to constant guarding chondromalacia patella was provided.  A handout on knee joint exercise was given.  I offered physical therapy which she declined.  Primary osteoarthritis of both feet-she has osteoarthritis in her feet with hammertoes.  Proper fitting shoes were advised.  DDD (degenerative disc disease),  cervical-she has limited lateral rotation without discomfort.  Spondylosis of lumbar spine-she has off-and-on discomfort.  Core strength exercises were discussed.  Other medical problems listed as follows:  History of neuropathy  Vitamin D  deficiency  History of hypertension  Pure hypercholesterolemia  Moderate aortic valve insufficiency  Nonrheumatic aortic valve insufficiency  Orders: Orders Placed This Encounter  Procedures   Protein / creatinine ratio, urine   CBC with Differential/Platelet   Comprehensive metabolic panel with GFR   Anti-DNA antibody, double-stranded   C3 and C4   Sedimentation rate   ANA   No orders of the defined types were placed in this encounter.    Follow-Up Instructions: Return in about 5 months (  around 08/23/2024) for Systemic lupus, Osteoarthritis.   Maya Nash, MD  Note - This record has been created using Animal nutritionist.  Chart creation errors have been sought, but may not always  have been located. Such creation errors do not reflect on  the standard of medical care.

## 2024-03-23 ENCOUNTER — Encounter: Payer: Self-pay | Admitting: Rheumatology

## 2024-03-23 ENCOUNTER — Ambulatory Visit: Attending: Rheumatology | Admitting: Rheumatology

## 2024-03-23 VITALS — BP 139/53 | HR 64 | Resp 15 | Ht 64.5 in | Wt 176.4 lb

## 2024-03-23 DIAGNOSIS — M19071 Primary osteoarthritis, right ankle and foot: Secondary | ICD-10-CM

## 2024-03-23 DIAGNOSIS — G8929 Other chronic pain: Secondary | ICD-10-CM

## 2024-03-23 DIAGNOSIS — M170 Bilateral primary osteoarthritis of knee: Secondary | ICD-10-CM | POA: Diagnosis not present

## 2024-03-23 DIAGNOSIS — M3219 Other organ or system involvement in systemic lupus erythematosus: Secondary | ICD-10-CM

## 2024-03-23 DIAGNOSIS — M25512 Pain in left shoulder: Secondary | ICD-10-CM | POA: Diagnosis not present

## 2024-03-23 DIAGNOSIS — M5030 Other cervical disc degeneration, unspecified cervical region: Secondary | ICD-10-CM

## 2024-03-23 DIAGNOSIS — Z79899 Other long term (current) drug therapy: Secondary | ICD-10-CM

## 2024-03-23 DIAGNOSIS — M19041 Primary osteoarthritis, right hand: Secondary | ICD-10-CM

## 2024-03-23 DIAGNOSIS — I7300 Raynaud's syndrome without gangrene: Secondary | ICD-10-CM | POA: Diagnosis not present

## 2024-03-23 DIAGNOSIS — M19042 Primary osteoarthritis, left hand: Secondary | ICD-10-CM

## 2024-03-23 DIAGNOSIS — I351 Nonrheumatic aortic (valve) insufficiency: Secondary | ICD-10-CM

## 2024-03-23 DIAGNOSIS — E7800 Pure hypercholesterolemia, unspecified: Secondary | ICD-10-CM

## 2024-03-23 DIAGNOSIS — M19072 Primary osteoarthritis, left ankle and foot: Secondary | ICD-10-CM

## 2024-03-23 DIAGNOSIS — M65341 Trigger finger, right ring finger: Secondary | ICD-10-CM

## 2024-03-23 DIAGNOSIS — Z8679 Personal history of other diseases of the circulatory system: Secondary | ICD-10-CM

## 2024-03-23 DIAGNOSIS — M65342 Trigger finger, left ring finger: Secondary | ICD-10-CM

## 2024-03-23 DIAGNOSIS — E559 Vitamin D deficiency, unspecified: Secondary | ICD-10-CM

## 2024-03-23 DIAGNOSIS — Z8669 Personal history of other diseases of the nervous system and sense organs: Secondary | ICD-10-CM

## 2024-03-23 DIAGNOSIS — M47816 Spondylosis without myelopathy or radiculopathy, lumbar region: Secondary | ICD-10-CM

## 2024-03-23 NOTE — Patient Instructions (Addendum)
 Exercises for Chronic Knee Pain Chronic knee pain is pain that lasts longer than 3 months. For most people with chronic knee pain, exercise and weight loss is an important part of treatment. Your health care provider may want you to focus on: Making the muscles that support your knee stronger. This can take pressure off your knee and reduce pain. Preventing knee stiffness. How far you can move your knee, keeping it there or making it farther. Losing weight (if this applies) to take pressure off your knee, lower your risk for injury, and make it easier for you to exercise. Your provider will help you make an exercise program that fits your needs and physical abilities. Below are simple, low-impact exercises you can do at home. Ask your provider or physical therapist how often you should do your exercise program and how many times to repeat each exercise. General safety tips  Get your provider's approval before doing any exercises. Start slowly and stop any time you feel pain. Do not exercise if your knee pain is flaring up. Warm up first. Stretching a cold muscle can cause an injury. Do 5-10 minutes of easy movement or light stretching before beginning your exercises. Do 5-10 minutes of low-impact activity (like walking or cycling) before starting strengthening exercises. Contact your provider any time you have pain during or after exercising. Exercise can cause discomfort but should not be painful. It is normal to be a little stiff or sore after exercising. Stretching and range-of-motion exercises Front thigh stretch  Stand up straight and support your body by holding on to a chair or resting one hand on a wall. With your legs straight and close together, bend one knee to lift your heel up toward your butt. Using one hand for support, grab your ankle with your free hand. Pull your foot up closer toward your butt to feel the stretch in front of your thigh. Hold the stretch for 30  seconds. Repeat __________ times. Complete this exercise __________ times a day. Back thigh stretch  Sit on the floor with your back straight and your legs out straight in front of you. Place the palms of your hands on the floor and slide them toward your feet as you bend at the hip. Try to touch your nose to your knees and feel the stretch in the back of your thighs. Hold for 30 seconds. Repeat __________ times. Complete this exercise __________ times a day. Calf stretch  Stand facing a wall. Place the palms of your hands flat against the wall, arms extended, and lean slightly against the wall. Get into a lunge position with one leg bent at the knee and the other leg stretched out straight behind you. Keep both feet facing the wall and increase the bend in your knee while keeping the heel of the other leg flat on the ground. You should feel the stretch in your calf. Hold for 30 seconds. Repeat __________ times. Complete this exercise __________ times a day. Strengthening exercises Straight leg lift  Lie on your back with one knee bent and the other leg out straight. Slowly lift the straight leg without bending the knee. Lift until your foot is about 12 inches (30 cm) off the floor. Hold for 3-5 seconds and slowly lower your leg. Repeat __________ times. Complete this exercise __________ times a day. Single leg dip  Stand between two chairs and put both hands on the backs of the chairs for support. Extend one leg out straight with your body  weight resting on the heel of the standing leg. Slowly bend your standing knee to dip your body to the level that is comfortable for you. Hold for 3-5 seconds. Repeat __________ times. Complete this exercise __________ times a day. Hamstring curls  Stand straight, knees close together, facing the back of a chair. Hold on to the back of a chair with both hands. Keep one leg straight. Bend the other knee while bringing the heel up toward the butt  until the knee is bent at a 90-degree angle (right angle). Hold for 3-5 seconds. Repeat __________ times. Complete this exercise __________ times a day. Wall squat  Stand straight with your back, hips, and head against a wall. Step forward one foot at a time with your back still against the wall. Your feet should be 2 feet (61 cm) from the wall at shoulder width. Keeping your back, hips, and head against the wall, slide down the wall to as close to a sitting position as you can get. Hold for 5-10 seconds, then slowly slide back up. Repeat __________ times. Complete this exercise __________ times a day. Step-ups  Stand in front of a sturdy platform or stool that is about 6 inches (15 cm) high. Slowly step up with your left / right foot, keeping your knee in line with your hip and foot. Do not let your knee bend so far that you cannot see your toes. Hold on to a chair for balance, but do not use it for support. Slowly unlock your knee and lower yourself to the starting position. Repeat __________ times. Complete this exercise __________ times a day. Contact a health care provider if: Your exercises cause pain. Your pain is worse after you exercise. Your pain prevents you from doing your exercises. This information is not intended to replace advice given to you by your health care provider. Make sure you discuss any questions you have with your health care provider. Document Revised: 08/31/2022 Document Reviewed: 08/31/2022 Elsevier Patient Education  2024 [employer]  Vaccines You are taking a medication(s) that can suppress your immune system.  The following immunizations are recommended: Flu annually Covid-19  Td/Tdap (tetanus, diphtheria, pertussis) every 10 years Pneumonia (Prevnar 15 then Pneumovax 23 at least 1 year apart.  Alternatively, can take Prevnar 20 without needing additional dose) Shingrix: 2 doses from 4 weeks to 6 months apart  Please check with your PCP to make  sure you are up to date.

## 2024-03-24 ENCOUNTER — Ambulatory Visit: Payer: Self-pay | Admitting: Rheumatology

## 2024-03-24 NOTE — Progress Notes (Signed)
 Hemoglobin is low at 10.7.  Patient to take multivitamin with iron.  CMP normal, urine protein creatinine ratio normal, double-stranded DNA negative, complements normal.Sed rate normal.  ANA pending.

## 2024-03-25 LAB — CBC WITH DIFFERENTIAL/PLATELET
Absolute Lymphocytes: 2287 {cells}/uL (ref 850–3900)
Absolute Monocytes: 485 {cells}/uL (ref 200–950)
Basophils Absolute: 19 {cells}/uL (ref 0–200)
Basophils Relative: 0.3 %
Eosinophils Absolute: 164 {cells}/uL (ref 15–500)
Eosinophils Relative: 2.6 %
HCT: 33.4 % — ABNORMAL LOW (ref 35.0–45.0)
Hemoglobin: 10.7 g/dL — ABNORMAL LOW (ref 11.7–15.5)
MCH: 27 pg (ref 27.0–33.0)
MCHC: 32 g/dL (ref 32.0–36.0)
MCV: 84.3 fL (ref 80.0–100.0)
MPV: 10.6 fL (ref 7.5–12.5)
Monocytes Relative: 7.7 %
Neutro Abs: 3345 {cells}/uL (ref 1500–7800)
Neutrophils Relative %: 53.1 %
Platelets: 248 10*3/uL (ref 140–400)
RBC: 3.96 Million/uL (ref 3.80–5.10)
RDW: 13 % (ref 11.0–15.0)
Total Lymphocyte: 36.3 %
WBC: 6.3 10*3/uL (ref 3.8–10.8)

## 2024-03-25 LAB — C3 AND C4
C3 Complement: 135 mg/dL
C4 Complement: 24 mg/dL

## 2024-03-25 LAB — ANTI-NUCLEAR AB-TITER (ANA TITER): ANA Titer 1: 1:320 {titer} — ABNORMAL HIGH

## 2024-03-25 LAB — COMPREHENSIVE METABOLIC PANEL WITH GFR
AG Ratio: 1.2 (calc) (ref 1.0–2.5)
ALT: 14 U/L (ref 6–29)
AST: 17 U/L (ref 10–35)
Albumin: 4 g/dL (ref 3.6–5.1)
Alkaline phosphatase (APISO): 78 U/L (ref 37–153)
BUN: 14 mg/dL (ref 7–25)
CO2: 27 mmol/L (ref 20–32)
Calcium: 9.6 mg/dL (ref 8.6–10.4)
Chloride: 104 mmol/L (ref 98–110)
Creat: 0.93 mg/dL (ref 0.60–0.95)
Globulin: 3.3 g/dL (ref 1.9–3.7)
Glucose, Bld: 94 mg/dL (ref 65–99)
Potassium: 3.9 mmol/L (ref 3.5–5.3)
Sodium: 139 mmol/L (ref 135–146)
Total Bilirubin: 0.4 mg/dL (ref 0.2–1.2)
Total Protein: 7.3 g/dL (ref 6.1–8.1)
eGFR: 61 mL/min/1.73m2 (ref >=60)

## 2024-03-25 LAB — PROTEIN / CREATININE RATIO, URINE
Creatinine, Urine: 53 mg/dL (ref 20–275)
Protein/Creat Ratio: 113 mg/g{creat} (ref 24–184)
Protein/Creatinine Ratio: 0.113 mg/mg{creat} (ref 0.024–0.184)
Total Protein, Urine: 6 mg/dL (ref 5–24)

## 2024-03-25 LAB — SEDIMENTATION RATE: Sed Rate: 17 mm/h (ref 0–30)

## 2024-03-25 LAB — ANTI-DNA ANTIBODY, DOUBLE-STRANDED: ds DNA Ab: 3 [IU]/mL

## 2024-03-25 LAB — ANA: Anti Nuclear Antibody (ANA): POSITIVE — AB

## 2024-03-26 NOTE — Progress Notes (Signed)
 ANA is positive and titers are stable.

## 2024-04-02 ENCOUNTER — Ambulatory Visit: Admitting: Rheumatology

## 2024-04-02 DIAGNOSIS — Z961 Presence of intraocular lens: Secondary | ICD-10-CM | POA: Diagnosis not present

## 2024-04-02 DIAGNOSIS — H25012 Cortical age-related cataract, left eye: Secondary | ICD-10-CM | POA: Diagnosis not present

## 2024-04-02 DIAGNOSIS — H26491 Other secondary cataract, right eye: Secondary | ICD-10-CM | POA: Diagnosis not present

## 2024-04-02 DIAGNOSIS — H5203 Hypermetropia, bilateral: Secondary | ICD-10-CM | POA: Diagnosis not present

## 2024-04-02 DIAGNOSIS — H2512 Age-related nuclear cataract, left eye: Secondary | ICD-10-CM | POA: Diagnosis not present

## 2024-04-02 DIAGNOSIS — Z79899 Other long term (current) drug therapy: Secondary | ICD-10-CM | POA: Diagnosis not present

## 2024-04-04 ENCOUNTER — Ambulatory Visit: Payer: Medicare PPO

## 2024-04-04 ENCOUNTER — Encounter: Payer: Self-pay | Admitting: Internal Medicine

## 2024-04-04 ENCOUNTER — Ambulatory Visit: Payer: Self-pay | Admitting: Internal Medicine

## 2024-04-04 VITALS — BP 128/60 | HR 70 | Temp 98.3°F | Ht 64.0 in | Wt 173.0 lb

## 2024-04-04 DIAGNOSIS — I700 Atherosclerosis of aorta: Secondary | ICD-10-CM | POA: Diagnosis not present

## 2024-04-04 DIAGNOSIS — I119 Hypertensive heart disease without heart failure: Secondary | ICD-10-CM | POA: Diagnosis not present

## 2024-04-04 DIAGNOSIS — Z6829 Body mass index (BMI) 29.0-29.9, adult: Secondary | ICD-10-CM | POA: Diagnosis not present

## 2024-04-04 DIAGNOSIS — E7800 Pure hypercholesterolemia, unspecified: Secondary | ICD-10-CM | POA: Diagnosis not present

## 2024-04-04 DIAGNOSIS — E663 Overweight: Secondary | ICD-10-CM

## 2024-04-04 DIAGNOSIS — K5904 Chronic idiopathic constipation: Secondary | ICD-10-CM | POA: Diagnosis not present

## 2024-04-04 DIAGNOSIS — Z0000 Encounter for general adult medical examination without abnormal findings: Secondary | ICD-10-CM | POA: Diagnosis not present

## 2024-04-04 MED ORDER — LINACLOTIDE 72 MCG PO CAPS: 72.0000 ug | ORAL_CAPSULE | ORAL | 1 refills | Status: AC | PRN

## 2024-04-04 NOTE — Progress Notes (Signed)
 Subjective:   AMARIYA LISKEY is a 83 y.o. who presents for a Medicare Wellness preventive visit.  As a reminder, Annual Wellness Visits don't include a physical exam, and some assessments may be limited, especially if this visit is performed virtually. We may recommend an in-person follow-up visit with your provider if needed.  Visit Complete: In person    Persons Participating in Visit: Patient.  AWV Questionnaire: No: Patient Medicare AWV questionnaire was not completed prior to this visit.  Cardiac Risk Factors include: advanced age (>7men, >61 women);hypertension     Objective:    Today's Vitals   04/04/24 1413  BP: 128/60  Pulse: 70  Temp: 98.3 F (36.8 C)  TempSrc: Oral  SpO2: 99%  Weight: 173 lb (78.5 kg)  Height: 5' 4 (1.626 m)   Body mass index is 29.7 kg/m.     04/04/2024    2:21 PM 05/23/2023    6:40 PM 03/30/2023    2:28 PM 01/08/2023    9:07 AM 03/10/2022    2:05 PM 12/23/2021    9:46 PM 02/18/2021    9:21 AM  Advanced Directives  Does Patient Have a Medical Advance Directive? Yes No Yes No Yes No Yes  Type of Estate agent of [place];Living will  Healthcare Power of [place];Living will  Healthcare Power of [place];Living will  Healthcare Power of [place];Living will  Copy of Healthcare Power of Attorney in Chart? No - copy requested  No - copy requested  No - copy requested  No - copy requested  Would patient like information on creating a medical advance directive?  No - Patient declined  No - Patient declined  No - Patient declined     Current Medications (verified) Outpatient Encounter Medications as of 04/04/2024  Medication Sig   albuterol  (PROVENTIL ) (2.5 MG/3ML) 0.083% nebulizer solution Take 3 mLs (2.5 mg total) by nebulization every 6 (six) hours as needed for wheezing or shortness of breath.   albuterol  (VENTOLIN  HFA) 108 (90 Base) MCG/ACT inhaler Inhale 1-2 puffs into the lungs every 6 (six) hours as needed for  wheezing or shortness of breath.   aspirin 81 MG tablet Take 81 mg by mouth daily.   atorvastatin  (LIPITOR) 10 MG tablet TAKE 1 TABLET BY MOUTH EVERY DAY EXCEPT SUNDAY   diclofenac  sodium (VOLTAREN ) 1 % GEL Apply 2 g to 4 g to affected joint up to 4 times daily PRN.   hydroxychloroquine  (PLAQUENIL ) 200 MG tablet TAKE 1 TABLET BY MOUTH TWICE DAILY MONDAY THROUGH FRIDAY ONLY.   lidocaine  (LIDODERM ) 5 % Place 1 patch onto the skin daily. Remove & Discard patch within 12 hours or as directed by MD   linaclotide  (LINZESS ) 72 MCG capsule Take 1 capsule (72 mcg total) by mouth as needed.   NIFEdipine  (ADALAT  CC) 90 MG 24 hr tablet TAKE 1 TABLET BY MOUTH EVERY DAY   omeprazole  (PRILOSEC) 40 MG capsule TAKE 1 CAPSULE BY MOUTH EVERY DAY BEFORE BREAKFAST (Patient taking differently: as needed.)   valsartan -hydrochlorothiazide  (DIOVAN -HCT) 320-12.5 MG tablet TAKE 1 TABLET BY MOUTH EVERY DAY IN THE MORNING   No facility-administered encounter medications on file as of 04/04/2024.    Allergies (verified) Patient has no known allergies.   History: Past Medical History:  Diagnosis Date   Asthma    Hypertension    Lupus    Past Surgical History:  Procedure Laterality Date   ABDOMINAL HYSTERECTOMY     CHOLECYSTECTOMY     SHOULDER ARTHROSCOPY W/ ROTATOR  CUFF REPAIR     R shoulder   TONSILLECTOMY     Family History  Problem Relation Age of Onset   Heart attack Mother    Stomach cancer Father    Hypertension Sister    Heart disease Sister    Heart disease Sister    Social History   Socioeconomic History   Marital status: Married    Spouse name: Not on file   Number of children: 2   Years of education: Not on file   Highest education level: Not on file  Occupational History   Occupation: retired  Tobacco Use   Smoking status: Never    Passive exposure: Never   Smokeless tobacco: Never  Vaping Use   Vaping status: Never Used  Substance and Sexual Activity   Alcohol use: No   Drug  use: No   Sexual activity: Not Currently    Birth control/protection: Surgical  Other Topics Concern   Not on file  Social History Narrative   Not on file   Social Drivers of Health   Financial Resource Strain: Low Risk  (04/04/2024)   Overall Financial Resource Strain (CARDIA)    Difficulty of Paying Living Expenses: Not hard at all  Food Insecurity: No Food Insecurity (04/04/2024)   Hunger Vital Sign    Worried About Running Out of Food in the Last Year: Never true    Ran Out of Food in the Last Year: Never true  Transportation Needs: No Transportation Needs (04/04/2024)   PRAPARE - Administrator, Civil Service (Medical): No    Lack of Transportation (Non-Medical): No  Physical Activity: Sufficiently Active (04/04/2024)   Exercise Vital Sign    Days of Exercise per Week: 7 days    Minutes of Exercise per Session: 30 min  Stress: No Stress Concern Present (04/04/2024)   [employer] of Occupational Health - Occupational Stress Questionnaire    Feeling of Stress: Not at all  Social Connections: Moderately Integrated (04/04/2024)   Social Connection and Isolation Panel    Frequency of Communication with Friends and Family: More than three times a week    Frequency of Social Gatherings with Friends and Family: Twice a week    Attends Religious Services: More than 4 times per year    Active Member of [employer] or Organizations: Yes    Attends Banker Meetings: More than 4 times per year    Marital Status: Widowed    Tobacco Counseling Counseling given: Not Answered    Clinical Intake:  Pre-visit preparation completed: Yes  Pain : No/denies pain     Nutritional Status: BMI 25 -29 Overweight Nutritional Risks: None Diabetes: No  Lab Results  Component Value Date   HGBA1C 5.5 11/26/2021     How often do you need to have someone help you when you read instructions, pamphlets, or other written materials from your doctor or pharmacy?: 1 -  Never  Interpreter Needed?: No  Information entered by :: NAllen LPN   Activities of Daily Living     04/04/2024    2:15 PM  In your present state of health, do you have any difficulty performing the following activities:  Hearing? 0  Vision? 1  Comment has a cataract  Difficulty concentrating or making decisions? 0  Walking or climbing stairs? 0  Dressing or bathing? 0  Doing errands, shopping? 0  Preparing Food and eating ? N  Using the Toilet? N  In the past six months, have  you accidently leaked urine? N  Do you have problems with loss of bowel control? N  Managing your Medications? N  Managing your Finances? N  Housekeeping or managing your Housekeeping? N    Patient Care Team: Jarold Medici, MD as PCP - General (Internal Medicine) Ladona Heinz, MD as PCP - Cardiology (Cardiology) Dolphus Reiter, MD as Consulting Physician (Rheumatology) Charmayne Molly, MD as Consulting Physician (Ophthalmology)  I have updated your Care Teams any recent Medical Services you may have received from other providers in the past year.     Assessment:   This is a routine wellness examination for [place].  Hearing/Vision screen Hearing Screening - Comments:: Denies hearing issues Vision Screening - Comments:: Regular eye exams, Dr. Charmayne   Goals Addressed             This Visit's Progress    Patient Stated       04/04/2024, stop eating at night       Depression Screen     04/04/2024    2:22 PM 12/20/2023   10:15 AM 03/30/2023    2:29 PM 12/14/2022   10:01 AM 07/28/2022    9:58 AM 03/10/2022    2:06 PM 02/18/2021    9:23 AM  PHQ 2/9 Scores  PHQ - 2 Score 0 0 0 0 0 0 0  PHQ- 9 Score 0 0 1 0       Fall Risk     04/04/2024    2:21 PM 12/20/2023   10:15 AM 03/30/2023    2:29 PM 12/14/2022   10:01 AM 07/28/2022    9:58 AM  Fall Risk   Falls in the past year? 0 0 0 0 0  Number falls in past yr: 0 0 0 0 0  Injury with Fall? 0 0 0 0 0  Risk for fall due to : Medication side  effect No Fall Risks Medication side effect No Fall Risks No Fall Risks  Follow up Falls evaluation completed;Falls prevention discussed Falls evaluation completed Falls prevention discussed;Falls evaluation completed Falls evaluation completed Falls evaluation completed      Data saved with a previous flowsheet row definition    MEDICARE RISK AT HOME:  Medicare Risk at Home Any stairs in or around the home?: Yes If so, are there any without handrails?: No Home free of loose throw rugs in walkways, pet beds, electrical cords, etc?: Yes Adequate lighting in your home to reduce risk of falls?: Yes Life alert?: No Use of a cane, walker or w/c?: No Grab bars in the bathroom?: Yes Shower chair or bench in shower?: Yes Elevated toilet seat or a handicapped toilet?: Yes  TIMED UP AND GO:  Was the test performed?  Yes  Length of time to ambulate 10 feet: 5 sec Gait steady and fast without use of assistive device  Cognitive Function: 6CIT completed        04/04/2024    2:25 PM 03/30/2023    2:30 PM 03/10/2022    2:07 PM 02/18/2021    9:24 AM 11/14/2019    2:23 PM  6CIT Screen  What Year? 0 points 0 points 0 points 0 points 0 points  What month? 0 points 0 points 0 points 0 points 0 points  What time? 0 points 0 points 0 points 0 points 0 points  Count back from 20 0 points 0 points 0 points 0 points 0 points  Months in reverse 0 points 0 points 0 points 0 points 0 points  Repeat phrase 0 points 2 points 2 points 4 points 2 points  Total Score 0 points 2 points 2 points 4 points 2 points    Immunizations Immunization History  Administered Date(s) Administered   Fluad Quad(high Dose 65+) 05/27/2021   Influenza, High Dose Seasonal PF 06/21/2017, 05/29/2018, 05/16/2019   Influenza-Unspecified 05/30/2018, 05/16/2019, 05/13/2020, 06/14/2022   MODERNA COVID-19 SARS-COV-2 PEDS BIVALENT BOOSTER 98yr-83yr 05/31/2023   PFIZER Comirnaty(Gray Top)Covid-19 Tri-Sucrose Vaccine 12/25/2020    PFIZER(Purple Top)SARS-COV-2 Vaccination 10/04/2019, 10/30/2019, 05/23/2020   Pfizer Covid-19 Vaccine Bivalent Booster 18yrs & up 07/19/2021, 07/12/2022   Pneumococcal Conjugate-13 07/06/2019   Pneumococcal Polysaccharide-23 09/17/2013   RSV,unspecified 09/09/2023   Zoster Recombinant(Shingrix) 05/29/2018, 08/01/2018   Zoster, Live 10/17/2020    Screening Tests Health Maintenance  Topic Date Due   COVID-19 Vaccine (8 - 2024-25 season) 07/26/2023   INFLUENZA VACCINE  03/30/2024   Medicare Annual Wellness (AWV)  04/04/2025   Pneumococcal Vaccine: 50+ Years  Completed   DEXA SCAN  Completed   Zoster Vaccines- Shingrix  Completed   Hepatitis B Vaccines  Aged Out   HPV VACCINES  Aged Out   Meningococcal B Vaccine  Aged Out   DTaP/Tdap/Td  Discontinued   Hepatitis C Screening  Discontinued    Health Maintenance  Health Maintenance Due  Topic Date Due   COVID-19 Vaccine (8 - 2024-25 season) 07/26/2023   INFLUENZA VACCINE  03/30/2024   Health Maintenance Items Addressed: Will get covid and flu vaccine in the fall.  Additional Screening:  Vision Screening: Recommended annual ophthalmology exams for early detection of glaucoma and other disorders of the eye. Would you like a referral to an eye doctor? No    Dental Screening: Recommended annual dental exams for proper oral hygiene  Community Resource Referral / Chronic Care Management: CRR required this visit?  No   CCM required this visit?  No   Plan:    I have personally reviewed and noted the following in the patient's chart:   Medical and social history Use of alcohol, tobacco or illicit drugs  Current medications and supplements including opioid prescriptions. Patient is not currently taking opioid prescriptions. Functional ability and status Nutritional status Physical activity Advanced directives List of other physicians Hospitalizations, surgeries, and ER visits in previous 12 months Vitals Screenings to  include cognitive, depression, and falls Referrals and appointments  In addition, I have reviewed and discussed with patient certain preventive protocols, quality metrics, and best practice recommendations. A written personalized care plan for preventive services as well as general preventive health recommendations were provided to patient.   Ardella FORBES Dawn, LPN   09/01/7972   After Visit Summary: (In Person-Printed) AVS printed and given to the patient  Notes: Nothing significant to report at this time.

## 2024-04-04 NOTE — Patient Instructions (Signed)
 Hypertension, Adult Hypertension is another name for high blood pressure. High blood pressure forces your heart to work harder to pump blood. This can cause problems over time. There are two numbers in a blood pressure reading. There is a top number (systolic) over a bottom number (diastolic). It is best to have a blood pressure that is below 120/80. What are the causes? The cause of this condition is not known. Some other conditions can lead to high blood pressure. What increases the risk? Some lifestyle factors can make you more likely to develop high blood pressure: Smoking. Not getting enough exercise or physical activity. Being overweight. Having too much fat, sugar, calories, or salt (sodium) in your diet. Drinking too much alcohol. Other risk factors include: Having any of these conditions: Heart disease. Diabetes. High cholesterol. Kidney disease. Obstructive sleep apnea. Having a family history of high blood pressure and high cholesterol. Age. The risk increases with age. Stress. What are the signs or symptoms? High blood pressure may not cause symptoms. Very high blood pressure (hypertensive crisis) may cause: Headache. Fast or uneven heartbeats (palpitations). Shortness of breath. Nosebleed. Vomiting or feeling like you may vomit (nauseous). Changes in how you see. Very bad chest pain. Feeling dizzy. Seizures. How is this treated? This condition is treated by making healthy lifestyle changes, such as: Eating healthy foods. Exercising more. Drinking less alcohol. Your doctor may prescribe medicine if lifestyle changes do not help enough and if: Your top number is above 130. Your bottom number is above 80. Your personal target blood pressure may vary. Follow these instructions at home: Eating and drinking  If told, follow the DASH eating plan. To follow this plan: Fill one half of your plate at each meal with fruits and vegetables. Fill one fourth of your plate  at each meal with whole grains. Whole grains include whole-wheat pasta, brown rice, and whole-grain bread. Eat or drink low-fat dairy products, such as skim milk or low-fat yogurt. Fill one fourth of your plate at each meal with low-fat (lean) proteins. Low-fat proteins include fish, chicken without skin, eggs, beans, and tofu. Avoid fatty meat, cured and processed meat, or chicken with skin. Avoid pre-made or processed food. Limit the amount of salt in your diet to less than 1,500 mg each day. Do not drink alcohol if: Your doctor tells you not to drink. You are pregnant, may be pregnant, or are planning to become pregnant. If you drink alcohol: Limit how much you have to: 0-1 drink a day for women. 0-2 drinks a day for men. Know how much alcohol is in your drink. In the [place] one drink equals one 12 oz bottle of beer (355 mL), one 5 oz glass of wine (148 mL), or one 1 oz glass of hard liquor (44 mL). Lifestyle  Work with your doctor to stay at a healthy weight or to lose weight. Ask your doctor what the best weight is for you. Get at least 30 minutes of exercise that causes your heart to beat faster (aerobic exercise) most days of the week. This may include walking, swimming, or biking. Get at least 30 minutes of exercise that strengthens your muscles (resistance exercise) at least 3 days a week. This may include lifting weights or doing Pilates. Do not smoke or use any products that contain nicotine or tobacco. If you need help quitting, ask your doctor. Check your blood pressure at home as told by your doctor. Keep all follow-up visits. Medicines Take over-the-counter and prescription medicines  only as told by your doctor. Follow directions carefully. Do not skip doses of blood pressure medicine. The medicine does not work as well if you skip doses. Skipping doses also puts you at risk for problems. Ask your doctor about side effects or reactions to medicines that you should watch  for. Contact a doctor if: You think you are having a reaction to the medicine you are taking. You have headaches that keep coming back. You feel dizzy. You have swelling in your ankles. You have trouble with your vision. Get help right away if: You get a very bad headache. You start to feel mixed up (confused). You feel weak or numb. You feel faint. You have very bad pain in your: Chest. Belly (abdomen). You vomit more than once. You have trouble breathing. These symptoms may be an emergency. Get help right away. Call 911. Do not wait to see if the symptoms will go away. Do not drive yourself to the hospital. Summary Hypertension is another name for high blood pressure. High blood pressure forces your heart to work harder to pump blood. For most people, a normal blood pressure is less than 120/80. Making healthy choices can help lower blood pressure. If your blood pressure does not get lower with healthy choices, you may need to take medicine. This information is not intended to replace advice given to you by your health care provider. Make sure you discuss any questions you have with your health care provider. Document Revised: 06/04/2021 Document Reviewed: 06/04/2021 Elsevier Patient Education  2024 [employer].

## 2024-04-04 NOTE — Progress Notes (Signed)
 I,Victoria T Emmitt, CMA,acting as a Neurosurgeon for Catheryn LOISE Slocumb, MD.,have documented all relevant documentation on the behalf of Catheryn LOISE Slocumb, MD,as directed by  Catheryn LOISE Slocumb, MD while in the presence of Catheryn LOISE Slocumb, MD.  Subjective:  Patient ID: Jessica Booker , female    DOB: [birth year] , 83 y.o.   MRN: [id number]  Chief Complaint  Patient presents with   Hypertension    Patient presents today for bp & chol follow up. She reports compliance with medications. Denies headache, chest pain & sob. AWV completed with [facility] Advisor: Nikeah.    Hyperlipidemia    HPI Discussed the use of AI scribe software for clinical note transcription with the patient, who gave verbal consent to proceed.  History of Present Illness Jessica Booker is an 83 year old female with hypertension who presents for a blood pressure check and Medicare visit.  She experiences anxiety related to doctor's appointments, which disrupts her sleep. She uses melatonin gummies to aid sleep but finds them ineffective.  She maintains a regular exercise routine, attending the [employer] three to four times a week and engaging in home exercises or walking on other days.  She has received vaccinations, including a COVID shot and another unspecified shot in January. She is concerned about the need for further vaccinations.  Regarding bone health, she had a mammogram in February but has not had a bone density scan due to scheduling issues, with the next available appointment in 2026, which she finds concerning.  Her current medications include atorvastatin  10 mg daily except Sundays, Plaquenil  once a day Monday through Friday, and nifedipine  90 mg daily for blood pressure. She uses albuterol  and Linzess  as needed and requested a refill for Linzess . She also takes multivitamins, which she resumed after a period of discontinuation, noting an improvement in how she feels.   Hypertension This is a chronic problem. The current  episode started more than 1 year ago. The problem has been gradually improving since onset. The problem is controlled. Associated symptoms include anxiety. Pertinent negatives include no headaches, palpitations or peripheral edema. Risk factors for coronary artery disease include sedentary lifestyle. Past treatments include diuretics. There are no compliance problems.  There is no history of angina. There is no history of chronic renal disease.     Past Medical History:  Diagnosis Date   Asthma    Hypertension    Lupus      Family History  Problem Relation Age of Onset   Heart attack Mother    Stomach cancer Father    Hypertension Sister    Heart disease Sister    Heart disease Sister      Current Outpatient Medications:    albuterol  (PROVENTIL ) (2.5 MG/3ML) 0.083% nebulizer solution, Take 3 mLs (2.5 mg total) by nebulization every 6 (six) hours as needed for wheezing or shortness of breath., Disp: 75 mL, Rfl: 2   albuterol  (VENTOLIN  HFA) 108 (90 Base) MCG/ACT inhaler, Inhale 1-2 puffs into the lungs every 6 (six) hours as needed for wheezing or shortness of breath., Disp: 18 each, Rfl: 3   aspirin 81 MG tablet, Take 81 mg by mouth daily., Disp: , Rfl:    atorvastatin  (LIPITOR) 10 MG tablet, TAKE 1 TABLET BY MOUTH EVERY DAY EXCEPT SUNDAY, Disp: 90 tablet, Rfl: 3   diclofenac  sodium (VOLTAREN ) 1 % GEL, Apply 2 g to 4 g to affected joint up to 4 times daily PRN., Disp: 4 Tube, Rfl: 2  hydroxychloroquine  (PLAQUENIL ) 200 MG tablet, TAKE 1 TABLET BY MOUTH TWICE DAILY MONDAY THROUGH FRIDAY ONLY., Disp: 120 tablet, Rfl: 0   lidocaine  (LIDODERM ) 5 %, Place 1 patch onto the skin daily. Remove & Discard patch within 12 hours or as directed by MD, Disp: 30 patch, Rfl: 0   NIFEdipine  (ADALAT  CC) 90 MG 24 hr tablet, TAKE 1 TABLET BY MOUTH EVERY DAY, Disp: 90 tablet, Rfl: 3   valsartan -hydrochlorothiazide  (DIOVAN -HCT) 320-12.5 MG tablet, TAKE 1 TABLET BY MOUTH EVERY DAY IN THE MORNING, Disp: 90  tablet, Rfl: 3   linaclotide  (LINZESS ) 72 MCG capsule, Take 1 capsule (72 mcg total) by mouth as needed., Disp: 30 capsule, Rfl: 1   No Known Allergies   Review of Systems  Constitutional: Negative.   Respiratory: Negative.    Cardiovascular: Negative.  Negative for palpitations.  Gastrointestinal: Negative.   Neurological: Negative.  Negative for headaches.  Psychiatric/Behavioral:  Positive for sleep disturbance.      Today's Vitals   04/04/24 1424  BP: 128/60  Pulse: 70  Temp: 98.3 F (36.8 C)  SpO2: 98%  Weight: 173 lb (78.5 kg)  Height: 5' 4 (1.626 m)   Body mass index is 29.7 kg/m.  Wt Readings from Last 3 Encounters:  04/04/24 173 lb (78.5 kg)  04/04/24 173 lb (78.5 kg)  03/23/24 176 lb 6.4 oz (80 kg)     Objective:  Physical Exam Vitals and nursing note reviewed.  Constitutional:      Appearance: Normal appearance.  HENT:     Head: Normocephalic and atraumatic.  Eyes:     Extraocular Movements: Extraocular movements intact.  Cardiovascular:     Rate and Rhythm: Normal rate and regular rhythm.     Heart sounds: Normal heart sounds.  Pulmonary:     Effort: Pulmonary effort is normal.     Breath sounds: Normal breath sounds.  Musculoskeletal:     Cervical back: Normal range of motion.  Skin:    General: Skin is warm.  Neurological:     General: No focal deficit present.     Mental Status: She is alert.  Psychiatric:        Mood and Affect: Mood normal.        Behavior: Behavior normal.         Assessment And Plan:  Hypertensive heart disease without heart failure Assessment & Plan: Chronic, well controlled. She will continue with valsartan /hct 320/12.5mg  daily, encouraged to follow low sodium diet. She will f/u in six months.     Atherosclerosis of aorta (HCC) Assessment & Plan: Chronic, LDL goal is less than 70.  She will continue with atorvastatin . Unfortunately, she doesn't tolerate daily dosing due to leg cramps.    Pure  hypercholesterolemia Assessment & Plan: Chronic, LDL goal is less than 70 due to underlying aortic atherosclerosis. She is currently taking atorvastatin  10mg  daily, except Sundays.  - Follow heart healthy lifestyle  Orders: -     Lipid panel  Chronic idiopathic constipation Assessment & Plan: Uses Linzess  as needed, recently ran out and unsure about refills. Stay well hydrated and aim for at least 25-35g of fiber per day.  - Provide Linzess  refill. - Check for Linzess  samples.   Overweight with body mass index (BMI) of 29 to 29.9 in adult Assessment & Plan: BMI is acceptable for her demographic. She is encouraged to aim for at least 150 minutes of exercise per week.    Other orders -     linaCLOtide ; Take 1  capsule (72 mcg total) by mouth as needed.  Dispense: 30 capsule; Refill: 1   Return if symptoms worsen or fail to improve, for cancel oct apt. .  Patient was given opportunity to ask questions. Patient verbalized understanding of the plan and was able to repeat key elements of the plan. All questions were answered to their satisfaction.    I, Catheryn LOISE Slocumb, MD, have reviewed all documentation for this visit. The documentation on 04/04/24 for the exam, diagnosis, procedures, and orders are all accurate and complete.   IF YOU HAVE BEEN REFERRED TO A SPECIALIST, IT MAY TAKE 1-2 WEEKS TO SCHEDULE/PROCESS THE REFERRAL. IF YOU HAVE NOT HEARD FROM US /SPECIALIST IN TWO WEEKS, PLEASE GIVE US  A CALL AT [phone] X [phone].   THE PATIENT IS ENCOURAGED TO PRACTICE SOCIAL DISTANCING DUE TO THE COVID-19 PANDEMIC.

## 2024-04-04 NOTE — Patient Instructions (Signed)
 Jessica Booker , Thank you for taking time out of your busy schedule to complete your Annual Wellness Visit with me. I enjoyed our conversation and look forward to speaking with you again next year. I, as well as your care team,  appreciate your ongoing commitment to your health goals. Please review the following plan we discussed and let me know if I can assist you in the future. Your Game plan/ To Do List    Referrals: If you haven't heard from the office you've been referred to, please reach out to them at the phone provided.   Follow up Visits: We will see or speak with you next year for your Next Medicare AWV with our clinical staff Have you seen your provider in the last 6 months (3 months if uncontrolled diabetes)? Yes  Clinician Recommendations:  Aim for 30 minutes of exercise or brisk walking, 6-8 glasses of water, and 5 servings of fruits and vegetables each day.       This is a list of the screenings recommended for you:  Health Maintenance  Topic Date Due   COVID-19 Vaccine (8 - 2024-25 season) 07/26/2023   Flu Shot  03/30/2024   Medicare Annual Wellness Visit  04/04/2025   Pneumococcal Vaccine for age over 46  Completed   DEXA scan (bone density measurement)  Completed   Zoster (Shingles) Vaccine  Completed   Hepatitis B Vaccine  Aged Out   HPV Vaccine  Aged Out   Meningitis B Vaccine  Aged Out   DTaP/Tdap/Td vaccine  Discontinued   Hepatitis C Screening  Discontinued    Advanced directives: (Copy Requested) Please bring a copy of your health care power of attorney and living will to the office to be added to your chart at your convenience. You can mail to [facility] [address] [place], [place] [address] or email to [email or web address] Advance Care Planning is important because it:  [x]  Makes sure you receive the medical care that is consistent with your values, goals, and preferences  [x]  It provides guidance to your family and loved ones  and reduces their decisional burden about whether or not they are making the right decisions based on your wishes.  Follow the link provided in your after visit summary or read over the paperwork we have mailed to you to help you started getting your Advance Directives in place. If you need assistance in completing these, please reach out to us  so that we can help you!  See attachments for Preventive Care and Fall Prevention Tips.

## 2024-04-05 LAB — LIPID PANEL
Chol/HDL Ratio: 1.9 ratio (ref 0.0–4.4)
Cholesterol, Total: 142 mg/dL (ref 100–199)
HDL: 73 mg/dL (ref >39)
LDL Chol Calc (NIH): 59 mg/dL (ref 0–99)
Triglycerides: 39 mg/dL (ref 0–149)
VLDL Cholesterol Cal: 10 mg/dL (ref 5–40)

## 2024-04-07 NOTE — Assessment & Plan Note (Signed)
 Chronic, LDL goal is less than 70 due to underlying aortic atherosclerosis. She is currently taking atorvastatin  10mg  daily, except Sundays.  - Follow heart healthy lifestyle

## 2024-04-07 NOTE — Assessment & Plan Note (Signed)
 Uses Linzess  as needed, recently ran out and unsure about refills. Stay well hydrated and aim for at least 25-35g of fiber per day.  - Provide Linzess  refill. - Check for Linzess  samples.

## 2024-04-07 NOTE — Assessment & Plan Note (Signed)
BMI is acceptable for her demographic. She is encouraged to aim for at least 150 minutes of exercise per week.

## 2024-04-07 NOTE — Assessment & Plan Note (Signed)
 Chronic, well controlled. She will continue with valsartan /hct 320/12.5mg  daily, encouraged to follow low sodium diet. She will f/u in six months.

## 2024-04-07 NOTE — Assessment & Plan Note (Signed)
Chronic, LDL goal is less than 70.  She will continue with atorvastatin. Unfortunately, she doesn't tolerate daily dosing due to leg cramps.

## 2024-04-08 ENCOUNTER — Ambulatory Visit: Payer: Self-pay | Admitting: Internal Medicine

## 2024-04-09 ENCOUNTER — Other Ambulatory Visit: Payer: Self-pay | Admitting: Cardiology

## 2024-04-09 DIAGNOSIS — I10 Essential (primary) hypertension: Secondary | ICD-10-CM

## 2024-04-09 DIAGNOSIS — I351 Nonrheumatic aortic (valve) insufficiency: Secondary | ICD-10-CM

## 2024-05-29 DIAGNOSIS — H25812 Combined forms of age-related cataract, left eye: Secondary | ICD-10-CM | POA: Diagnosis not present

## 2024-05-29 DIAGNOSIS — Z961 Presence of intraocular lens: Secondary | ICD-10-CM | POA: Diagnosis not present

## 2024-05-29 DIAGNOSIS — H2512 Age-related nuclear cataract, left eye: Secondary | ICD-10-CM | POA: Diagnosis not present

## 2024-05-29 DIAGNOSIS — H25012 Cortical age-related cataract, left eye: Secondary | ICD-10-CM | POA: Diagnosis not present

## 2024-06-21 ENCOUNTER — Ambulatory Visit: Admitting: Internal Medicine

## 2024-08-01 DIAGNOSIS — H2000 Unspecified acute and subacute iridocyclitis: Secondary | ICD-10-CM | POA: Diagnosis not present

## 2024-08-03 ENCOUNTER — Other Ambulatory Visit: Payer: Self-pay

## 2024-08-03 DIAGNOSIS — I340 Nonrheumatic mitral (valve) insufficiency: Secondary | ICD-10-CM

## 2024-08-03 DIAGNOSIS — I10 Essential (primary) hypertension: Secondary | ICD-10-CM

## 2024-08-07 MED ORDER — VALSARTAN-HYDROCHLOROTHIAZIDE 320-12.5 MG PO TABS: ORAL_TABLET | ORAL | 0 refills | Status: DC

## 2024-08-27 NOTE — Progress Notes (Signed)
 "  Office Visit Note  Patient: Jessica Booker             Date of Birth: 1941-06-06           MRN: [id number]             PCP: Jarold Medici, MD Referring: Jarold Medici, MD Visit Date: 09/06/2024 Occupation: Data Unavailable  Subjective:  Medication management  History of Present Illness: Jessica Booker is a 83 y.o. female with systemic lupus and osteoarthritis.  She returns today after her last visit in July 2025.  She states she has been having popping sensation in her both knee joints when she walks.  She has not noticed any joint swelling.  She continues to have some stiffness in her joints especially her hands and her feet.  She continues to have some discomfort in her neck and lower back.. She gives history of dry mouth.  She denies any history of fatigue, oral ulcers, nasal ulcers, malar rash, photosensitivity, lymphadenopathy.  She continues to have Raynaud's symptoms.    Activities of Daily Living:  Patient reports morning stiffness for 0 minutes.   Patient Denies nocturnal pain.  Difficulty dressing/grooming: Denies Difficulty climbing stairs: Denies Difficulty getting out of chair: Denies Difficulty using hands for taps, buttons, cutlery, and/or writing: Denies  Review of Systems  Constitutional:  Negative for fatigue.  HENT:  Positive for mouth dryness. Negative for mouth sores.   Eyes:  Negative for dryness.  Respiratory:  Negative for shortness of breath.   Cardiovascular:  Negative for chest pain and palpitations.  Gastrointestinal:  Negative for blood in stool, constipation and diarrhea.  Endocrine: Negative for increased urination.  Genitourinary:  Negative for involuntary urination.  Musculoskeletal:  Positive for joint pain, joint pain, myalgias, muscle tenderness and myalgias. Negative for gait problem, joint swelling, muscle weakness and morning stiffness.  Skin:  Positive for color change and hair loss. Negative for rash and sensitivity to sunlight.   Allergic/Immunologic: Negative for susceptible to infections.  Neurological:  Negative for dizziness and headaches.  Hematological:  Negative for swollen glands.  Psychiatric/Behavioral:  Positive for sleep disturbance. Negative for depressed mood. The patient is not nervous/anxious.     PMFS History:  Patient Active Problem List   Diagnosis Date Noted   Guaiac positive stools 12/25/2023   Cataract of left eye 12/25/2023   Encounter for general adult medical examination w/o abnormal findings 12/20/2023   Seasonal allergic rhinitis due to pollen 12/20/2023   Normocytic anemia 08/28/2023   Overweight with body mass index (BMI) of 29 to 29.9 in adult 08/28/2023   Sleep related leg cramps 04/03/2023   Atherosclerosis of aorta 04/03/2023   Hypertensive heart disease without heart failure 04/03/2023   Pure hypercholesterolemia 04/03/2023   Class 1 obesity due to excess calories with serious comorbidity and body mass index (BMI) of 30.0 to 30.9 in adult 04/03/2023   Wheezing 01/10/2023   Acute cough 01/10/2023   Pneumonia of right middle lobe due to infectious organism 01/10/2023   Moderate persistent asthma with acute exacerbation 01/10/2023   Primary insomnia 08/31/2022   Dyspnea 04/11/2019   Belching 04/11/2019   Constipation 04/11/2019   Essential hypertension 11/02/2018   Lupus 11/02/2018   Primary osteoarthritis of both knees 06/06/2018   Cough variant asthma 08/25/2017   Systemic lupus erythematosus (HCC) 02/18/2017   High risk medication use 02/18/2017   Raynaud's disease without gangrene 02/18/2017   DDD (degenerative disc disease), lumbar 02/18/2017   Primary osteoarthritis  of both hands 02/18/2017   Primary osteoarthritis of both feet 02/18/2017   Vitamin D  deficiency 02/18/2017   History of hypertension 02/18/2017   History of neuropathy 02/18/2017    Past Medical History:  Diagnosis Date   Asthma    Hypertension    Lupus     Family History  Problem Relation  Age of Onset   Heart attack Mother    Stomach cancer Father    Hypertension Sister    Heart disease Sister    Heart disease Sister    Past Surgical History:  Procedure Laterality Date   ABDOMINAL HYSTERECTOMY     CHOLECYSTECTOMY     SHOULDER ARTHROSCOPY W/ ROTATOR CUFF REPAIR     R shoulder   TONSILLECTOMY     Social History[1] Social History   Social History Narrative   Not on file     Immunization History  Administered Date(s) Administered   Fluad Quad(high Dose 65+) 05/27/2021   INFLUENZA, HIGH DOSE SEASONAL PF 06/21/2017, 05/29/2018, 05/16/2019, 06/11/2024   Influenza-Unspecified 05/30/2018, 05/16/2019, 05/13/2020, 06/14/2022   MODERNA COVID-19 SARS-COV-2 PEDS BIVALENT BOOSTER 31yr-13yr 05/31/2023   Moderna SARS-COV2 Booster Vaccination 06/20/2024   PFIZER Comirnaty(Gray Top)Covid-19 Tri-Sucrose Vaccine 12/25/2020   PFIZER(Purple Top)SARS-COV-2 Vaccination 10/04/2019, 10/30/2019, 05/23/2020   Pfizer Covid-19 Vaccine Bivalent Booster 60yrs & up 07/19/2021, 07/12/2022   Pneumococcal Conjugate-13 07/06/2019   Pneumococcal Polysaccharide-23 09/17/2013   RSV,unspecified 09/09/2023   Tdap 07/17/2024   Zoster Recombinant(Shingrix) 05/29/2018, 08/01/2018   Zoster, Live 10/17/2020     Objective: Vital Signs: BP (!) 147/67   Pulse 65   Temp 97.8 F (36.6 C)   Resp 14   Ht 5' 4.5 (1.638 m)   Wt 181 lb 3.2 oz (82.2 kg)   BMI 30.62 kg/m    Physical Exam Vitals and nursing note reviewed.  Constitutional:      Appearance: She is well-developed.  HENT:     Head: Normocephalic and atraumatic.  Eyes:     Conjunctiva/sclera: Conjunctivae normal.  Cardiovascular:     Rate and Rhythm: Normal rate and regular rhythm.     Heart sounds: Normal heart sounds.  Pulmonary:     Effort: Pulmonary effort is normal.     Breath sounds: Normal breath sounds.  Abdominal:     General: Bowel sounds are normal.     Palpations: Abdomen is soft.  Musculoskeletal:     Cervical back:  Normal range of motion.  Lymphadenopathy:     Cervical: No cervical adenopathy.  Skin:    General: Skin is warm and dry.     Capillary Refill: Capillary refill takes less than 2 seconds.  Neurological:     Mental Status: She is alert and oriented to person, place, and time.  Psychiatric:        Behavior: Behavior normal.      Musculoskeletal Exam: Cervical, thoracic and lumbar spine were in good range of motion.  There was no SI joint tenderness.  Shoulder joints, elbow joints, wrist joints, MCPs, PIPs and DIPs were in good range of motion with no synovitis.  Bilateral PIP and DIP thickening with subluxation of some of the DIP joints was noted.  Hip joints and knee joints were in good range of motion without any warmth swelling or effusion.  She had crepitus in bilateral knee joints.  There was no tenderness over ankles or MTPs.   CDAI Exam: CDAI Score: -- Patient Global: --; Provider Global: -- Swollen: --; Tender: -- Joint Exam 09/06/2024   No  joint exam has been documented for this visit   There is currently no information documented on the homunculus. Go to the Rheumatology activity and complete the homunculus joint exam.  Investigation: No additional findings.  Imaging: No results found.  Recent Labs: Lab Results  Component Value Date   WBC 6.3 03/23/2024   HGB 10.7 (L) 03/23/2024   PLT 248 03/23/2024   NA 139 03/23/2024   K 3.9 03/23/2024   CL 104 03/23/2024   CO2 27 03/23/2024   GLUCOSE 94 03/23/2024   BUN 14 03/23/2024   CREATININE 0.93 03/23/2024   BILITOT 0.4 03/23/2024   ALKPHOS 74 12/23/2021   AST 17 03/23/2024   ALT 14 03/23/2024   PROT 7.3 03/23/2024   ALBUMIN 4.2 12/23/2021   CALCIUM  9.6 03/23/2024   GFRAA 73 06/27/2020    Speciality Comments: PLQ Eye Exam: 04/02/2024 WNL @ [facility] Follow up in 1 year.   Procedures:  No procedures performed Allergies: Patient has no known allergies.   Assessment / Plan:     Visit Diagnoses:  Other systemic lupus erythematosus with other organ involvement (HCC) - -ANA, positive Smith, and positive Ro and RNP: Patient denies having a flare of lupus.  There is no history of oral ulcers, nasal ulcers, malar rash, photosensitivity, Raynaud's or lymphadenopathy.  No synovitis was noted on the examination.  Labs obtained on February 22, 2024 showed positive ANA, double-stranded ENA was negative complements normal, sed rate normal.  I will check labs today.  High risk medication use - Plaquenil  200 mg 1 tablet by mouth twice daily M-F. PLQ Eye Exam: 04/02/2024.  She has been getting annual eye examination.  Information reimmunization was placed in the AVS.  Raynaud's disease without gangrene-Raynauds is active during the winter months.  Keeping cold temperature warm and warm clothing was discussed.  Chronic left shoulder pain -she has off-and-on discomfort in her shoulders.  X-rays of the left shoulder on 07/31/2021 were consistent with [facility] joint arthritis.MRI of the shoulder joint from May 20, 2022  Primary osteoarthritis of both hands-continues to have some pain and stiffness.  She had bilateral PIP and DIP thickening and DIP subluxation.  Joint protection was discussed.  Trigger finger, left ring finger and trigger finger, right ring finger-currently asymptomatic.  Primary osteoarthritis of both knees -she continues to have some crepitus in her knee joints.  X-rays from 2019.  Left knee joint x-ray showed moderate osteoarthritis and moderate chondromalacia patella.  I gave her a handout on exercises at the last visit.  I will refer her to physical therapy.  Primary osteoarthritis of both feet-proper fitting shoes were advised.  DDD (degenerative disc disease), cervical-she continues to have some stiffness.  Range of motion exercises were demonstrated.  Spondylosis of lumbar spine-core strength exercises were discussed.  Other medical problems are listed as follows:  History of  neuropathy  Vitamin D  deficiency  Pure hypercholesterolemia  History of hypertension-pressure was elevated at 153/70.  Repeat blood pressure was 147/67.  She was advised to monitor blood pressure closely and follow-up with the PCP.  Nonrheumatic aortic valve insufficiency  Moderate aortic valve insufficiency  Orders: Orders Placed This Encounter  Procedures   Protein / creatinine ratio, urine   CBC with Differential/Platelet   Comprehensive metabolic panel with GFR   ANA   Anti-DNA antibody, double-stranded   C3 and C4   Sedimentation rate   No orders of the defined types were placed in this encounter.    Follow-Up Instructions: Return in  about 5 months (around 02/04/2025) for Systemic lupus, Osteoarthritis.   Maya Nash, MD  Note - This record has been created using Animal nutritionist.  Chart creation errors have been sought, but may not always  have been located. Such creation errors do not reflect on  the standard of medical care.     [1]  Social History Tobacco Use   Smoking status: Never    Passive exposure: Never   Smokeless tobacco: Never  Vaping Use   Vaping status: Never Used  Substance Use Topics   Alcohol use: No   Drug use: No   "

## 2024-09-04 ENCOUNTER — Other Ambulatory Visit: Payer: Self-pay | Admitting: Cardiology

## 2024-09-04 DIAGNOSIS — I10 Essential (primary) hypertension: Secondary | ICD-10-CM

## 2024-09-04 DIAGNOSIS — I340 Nonrheumatic mitral (valve) insufficiency: Secondary | ICD-10-CM

## 2024-09-06 ENCOUNTER — Encounter: Payer: Self-pay | Admitting: Rheumatology

## 2024-09-06 ENCOUNTER — Ambulatory Visit: Attending: Rheumatology | Admitting: Rheumatology

## 2024-09-06 VITALS — BP 147/67 | HR 65 | Temp 97.8°F | Resp 14 | Ht 64.5 in | Wt 181.2 lb

## 2024-09-06 DIAGNOSIS — M3219 Other organ or system involvement in systemic lupus erythematosus: Secondary | ICD-10-CM

## 2024-09-06 DIAGNOSIS — M5030 Other cervical disc degeneration, unspecified cervical region: Secondary | ICD-10-CM | POA: Diagnosis not present

## 2024-09-06 DIAGNOSIS — M19041 Primary osteoarthritis, right hand: Secondary | ICD-10-CM | POA: Diagnosis not present

## 2024-09-06 DIAGNOSIS — M65341 Trigger finger, right ring finger: Secondary | ICD-10-CM | POA: Diagnosis not present

## 2024-09-06 DIAGNOSIS — M65342 Trigger finger, left ring finger: Secondary | ICD-10-CM

## 2024-09-06 DIAGNOSIS — I7300 Raynaud's syndrome without gangrene: Secondary | ICD-10-CM | POA: Diagnosis not present

## 2024-09-06 DIAGNOSIS — M47816 Spondylosis without myelopathy or radiculopathy, lumbar region: Secondary | ICD-10-CM | POA: Diagnosis not present

## 2024-09-06 DIAGNOSIS — M170 Bilateral primary osteoarthritis of knee: Secondary | ICD-10-CM | POA: Diagnosis not present

## 2024-09-06 DIAGNOSIS — Z8679 Personal history of other diseases of the circulatory system: Secondary | ICD-10-CM

## 2024-09-06 DIAGNOSIS — Z8669 Personal history of other diseases of the nervous system and sense organs: Secondary | ICD-10-CM | POA: Diagnosis not present

## 2024-09-06 DIAGNOSIS — Z79899 Other long term (current) drug therapy: Secondary | ICD-10-CM

## 2024-09-06 DIAGNOSIS — G8929 Other chronic pain: Secondary | ICD-10-CM

## 2024-09-06 DIAGNOSIS — E559 Vitamin D deficiency, unspecified: Secondary | ICD-10-CM

## 2024-09-06 DIAGNOSIS — M19071 Primary osteoarthritis, right ankle and foot: Secondary | ICD-10-CM

## 2024-09-06 DIAGNOSIS — M19072 Primary osteoarthritis, left ankle and foot: Secondary | ICD-10-CM

## 2024-09-06 DIAGNOSIS — I351 Nonrheumatic aortic (valve) insufficiency: Secondary | ICD-10-CM

## 2024-09-06 DIAGNOSIS — M19042 Primary osteoarthritis, left hand: Secondary | ICD-10-CM

## 2024-09-06 DIAGNOSIS — M25512 Pain in left shoulder: Secondary | ICD-10-CM

## 2024-09-06 DIAGNOSIS — E7800 Pure hypercholesterolemia, unspecified: Secondary | ICD-10-CM

## 2024-09-06 NOTE — Patient Instructions (Signed)

## 2024-09-09 ENCOUNTER — Ambulatory Visit: Payer: Self-pay | Admitting: Rheumatology

## 2024-09-09 DIAGNOSIS — Z79899 Other long term (current) drug therapy: Secondary | ICD-10-CM

## 2024-09-09 DIAGNOSIS — M3219 Other organ or system involvement in systemic lupus erythematosus: Secondary | ICD-10-CM

## 2024-09-09 LAB — COMPREHENSIVE METABOLIC PANEL WITH GFR
AG Ratio: 1.3 (calc) (ref 1.0–2.5)
ALT: 23 U/L (ref 6–29)
AST: 22 U/L (ref 10–35)
Albumin: 4.6 g/dL (ref 3.6–5.1)
Alkaline phosphatase (APISO): 109 U/L (ref 37–153)
BUN: 17 mg/dL (ref 7–25)
CO2: 28 mmol/L (ref 20–32)
Calcium: 10.2 mg/dL (ref 8.6–10.4)
Chloride: 102 mmol/L (ref 98–110)
Creat: 0.84 mg/dL (ref 0.60–0.95)
Globulin: 3.6 g/dL (ref 1.9–3.7)
Glucose, Bld: 88 mg/dL (ref 65–99)
Potassium: 4 mmol/L (ref 3.5–5.3)
Sodium: 137 mmol/L (ref 135–146)
Total Bilirubin: 0.4 mg/dL (ref 0.2–1.2)
Total Protein: 8.2 g/dL — ABNORMAL HIGH (ref 6.1–8.1)
eGFR: 69 mL/min/1.73m2

## 2024-09-09 LAB — PROTEIN / CREATININE RATIO, URINE
Creatinine, Urine: 26 mg/dL (ref 20–275)
Protein/Creat Ratio: 192 mg/g{creat} — ABNORMAL HIGH (ref 24–184)
Protein/Creatinine Ratio: 0.192 mg/mg{creat} — ABNORMAL HIGH (ref 0.024–0.184)
Total Protein, Urine: 5 mg/dL (ref 5–24)

## 2024-09-09 LAB — CBC WITH DIFFERENTIAL/PLATELET
Absolute Lymphocytes: 3286 {cells}/uL (ref 850–3900)
Absolute Monocytes: 490 {cells}/uL (ref 200–950)
Basophils Absolute: 40 {cells}/uL (ref 0–200)
Basophils Relative: 0.5 %
Eosinophils Absolute: 229 {cells}/uL (ref 15–500)
Eosinophils Relative: 2.9 %
HCT: 36.6 % (ref 35.9–46.0)
Hemoglobin: 11.7 g/dL (ref 11.7–15.5)
MCH: 26.7 pg — ABNORMAL LOW (ref 27.0–33.0)
MCHC: 32 g/dL (ref 31.6–35.4)
MCV: 83.6 fL (ref 81.4–101.7)
MPV: 10.3 fL (ref 7.5–12.5)
Monocytes Relative: 6.2 %
Neutro Abs: 3855 {cells}/uL (ref 1500–7800)
Neutrophils Relative %: 48.8 %
Platelets: 304 Thousand/uL (ref 140–400)
RBC: 4.38 Million/uL (ref 3.80–5.10)
RDW: 13.1 % (ref 11.0–15.0)
Total Lymphocyte: 41.6 %
WBC: 7.9 Thousand/uL (ref 3.8–10.8)

## 2024-09-09 LAB — ANTI-NUCLEAR AB-TITER (ANA TITER): ANA Titer 1: 1:160 {titer} — ABNORMAL HIGH

## 2024-09-09 LAB — SEDIMENTATION RATE: Sed Rate: 29 mm/h (ref 0–30)

## 2024-09-09 LAB — C3 AND C4
C3 Complement: 155 mg/dL
C4 Complement: 27 mg/dL

## 2024-09-09 LAB — ANTI-DNA ANTIBODY, DOUBLE-STRANDED: ds DNA Ab: 3 [IU]/mL

## 2024-09-09 LAB — ANA: Anti Nuclear Antibody (ANA): POSITIVE — AB

## 2024-09-09 NOTE — Progress Notes (Signed)
 Urine protein creatinine ratio mildly elevated.  ANA remains low titer positive.  Double-stranded DNA negative, complements normal, sed rate normal.  CBC and CMP are stable.  Labs do not indicate a lupus flare.  Patient should have repeat urine protein creatinine test in 2  weeks.

## 2024-09-13 ENCOUNTER — Other Ambulatory Visit

## 2024-09-19 ENCOUNTER — Other Ambulatory Visit: Payer: Self-pay | Admitting: Internal Medicine

## 2024-09-19 DIAGNOSIS — Z1231 Encounter for screening mammogram for malignant neoplasm of breast: Secondary | ICD-10-CM

## 2024-10-18 ENCOUNTER — Ambulatory Visit

## 2024-11-09 ENCOUNTER — Ambulatory Visit: Admitting: Cardiology

## 2024-12-20 ENCOUNTER — Encounter: Payer: Self-pay | Admitting: Internal Medicine

## 2025-02-08 ENCOUNTER — Ambulatory Visit: Admitting: Rheumatology

## 2025-04-08 ENCOUNTER — Ambulatory Visit: Admitting: Internal Medicine

## 2025-05-08 ENCOUNTER — Ambulatory Visit: Payer: Self-pay | Admitting: Internal Medicine

## 2025-05-08 ENCOUNTER — Ambulatory Visit: Payer: Self-pay
